# Patient Record
Sex: Female | Born: 1955 | Race: White | Hispanic: No | Marital: Married | Smoking: Former smoker
Health system: Southern US, Community
[De-identification: ages and names within clinical notes are randomized; demographics above are authoritative.]

## PROBLEM LIST (undated history)

## (undated) DIAGNOSIS — K589 Irritable bowel syndrome without diarrhea: Secondary | ICD-10-CM

## (undated) DIAGNOSIS — I10 Essential (primary) hypertension: Secondary | ICD-10-CM

## (undated) DIAGNOSIS — M19019 Primary osteoarthritis, unspecified shoulder: Secondary | ICD-10-CM

## (undated) DIAGNOSIS — M1710 Unilateral primary osteoarthritis, unspecified knee: Secondary | ICD-10-CM

## (undated) DIAGNOSIS — Z8719 Personal history of other diseases of the digestive system: Secondary | ICD-10-CM

## (undated) DIAGNOSIS — G609 Hereditary and idiopathic neuropathy, unspecified: Secondary | ICD-10-CM

## (undated) DIAGNOSIS — R768 Other specified abnormal immunological findings in serum: Secondary | ICD-10-CM

## (undated) DIAGNOSIS — G4734 Idiopathic sleep related nonobstructive alveolar hypoventilation: Secondary | ICD-10-CM

## (undated) DIAGNOSIS — IMO0001 Reserved for inherently not codable concepts without codable children: Secondary | ICD-10-CM

## (undated) DIAGNOSIS — C801 Malignant (primary) neoplasm, unspecified: Secondary | ICD-10-CM

## (undated) DIAGNOSIS — M7542 Impingement syndrome of left shoulder: Secondary | ICD-10-CM

## (undated) DIAGNOSIS — F419 Anxiety disorder, unspecified: Secondary | ICD-10-CM

## (undated) DIAGNOSIS — M179 Osteoarthritis of knee, unspecified: Secondary | ICD-10-CM

## (undated) DIAGNOSIS — G2581 Restless legs syndrome: Secondary | ICD-10-CM

## (undated) DIAGNOSIS — Z9889 Other specified postprocedural states: Secondary | ICD-10-CM

## (undated) DIAGNOSIS — Z85820 Personal history of malignant melanoma of skin: Secondary | ICD-10-CM

## (undated) DIAGNOSIS — K219 Gastro-esophageal reflux disease without esophagitis: Secondary | ICD-10-CM

## (undated) HISTORY — DX: Malignant (primary) neoplasm, unspecified: C80.1

## (undated) HISTORY — PX: ABDOMINAL HYSTERECTOMY: SHX81

## (undated) HISTORY — DX: Hereditary and idiopathic neuropathy, unspecified: G60.9

## (undated) HISTORY — DX: Anxiety disorder, unspecified: F41.9

## (undated) HISTORY — PX: OTHER SURGICAL HISTORY: SHX169

---

## 1993-02-01 HISTORY — PX: CARPAL TUNNEL RELEASE: SHX101

## 1994-02-01 HISTORY — PX: CHOLECYSTECTOMY: SHX55

## 1997-02-01 HISTORY — PX: TOTAL ABDOMINAL HYSTERECTOMY W/ BILATERAL SALPINGOOPHORECTOMY: SHX83

## 1998-02-01 HISTORY — PX: ANTERIOR CERVICAL DECOMP/DISCECTOMY FUSION: SHX1161

## 1999-07-06 ENCOUNTER — Ambulatory Visit (HOSPITAL_COMMUNITY): Admission: RE | Admit: 1999-07-06 | Discharge: 1999-07-06 | Payer: Self-pay | Admitting: Family Medicine

## 1999-07-06 ENCOUNTER — Encounter: Payer: Self-pay | Admitting: Family Medicine

## 1999-07-12 ENCOUNTER — Ambulatory Visit (HOSPITAL_COMMUNITY): Admission: RE | Admit: 1999-07-12 | Discharge: 1999-07-12 | Payer: Self-pay | Admitting: Family Medicine

## 1999-07-12 ENCOUNTER — Encounter: Payer: Self-pay | Admitting: Family Medicine

## 2001-03-10 ENCOUNTER — Encounter: Payer: Self-pay | Admitting: Orthopaedic Surgery

## 2001-03-10 ENCOUNTER — Encounter: Admission: RE | Admit: 2001-03-10 | Discharge: 2001-03-10 | Payer: Self-pay | Admitting: Orthopaedic Surgery

## 2001-03-24 ENCOUNTER — Encounter: Admission: RE | Admit: 2001-03-24 | Discharge: 2001-03-24 | Payer: Self-pay | Admitting: Orthopaedic Surgery

## 2001-03-24 ENCOUNTER — Encounter: Payer: Self-pay | Admitting: Orthopaedic Surgery

## 2001-04-07 ENCOUNTER — Encounter: Payer: Self-pay | Admitting: Orthopaedic Surgery

## 2001-04-07 ENCOUNTER — Encounter: Admission: RE | Admit: 2001-04-07 | Discharge: 2001-04-07 | Payer: Self-pay | Admitting: Orthopaedic Surgery

## 2003-04-22 ENCOUNTER — Ambulatory Visit (HOSPITAL_COMMUNITY): Admission: RE | Admit: 2003-04-22 | Discharge: 2003-04-22 | Payer: Self-pay | Admitting: Family Medicine

## 2003-08-22 ENCOUNTER — Ambulatory Visit (HOSPITAL_COMMUNITY): Admission: RE | Admit: 2003-08-22 | Discharge: 2003-08-23 | Payer: Self-pay | Admitting: Neurosurgery

## 2003-08-22 HISTORY — PX: LUMBAR DISC SURGERY: SHX700

## 2004-03-17 ENCOUNTER — Ambulatory Visit: Payer: Self-pay | Admitting: Family Medicine

## 2004-08-03 ENCOUNTER — Ambulatory Visit: Payer: Self-pay | Admitting: Family Medicine

## 2005-03-02 ENCOUNTER — Ambulatory Visit: Payer: Self-pay | Admitting: Family Medicine

## 2005-06-16 ENCOUNTER — Ambulatory Visit: Payer: Self-pay | Admitting: Family Medicine

## 2005-08-24 ENCOUNTER — Ambulatory Visit: Payer: Self-pay | Admitting: Family Medicine

## 2005-09-23 ENCOUNTER — Ambulatory Visit: Payer: Self-pay | Admitting: Cardiology

## 2005-11-08 ENCOUNTER — Ambulatory Visit: Payer: Self-pay | Admitting: Family Medicine

## 2005-12-27 ENCOUNTER — Ambulatory Visit: Payer: Self-pay | Admitting: Family Medicine

## 2006-03-25 ENCOUNTER — Ambulatory Visit: Payer: Self-pay | Admitting: Family Medicine

## 2007-07-03 HISTORY — PX: COLONOSCOPY: SHX174

## 2007-07-03 HISTORY — PX: ESOPHAGOGASTRODUODENOSCOPY: SHX1529

## 2007-07-04 ENCOUNTER — Ambulatory Visit: Payer: Self-pay | Admitting: Internal Medicine

## 2007-07-11 ENCOUNTER — Ambulatory Visit: Payer: Self-pay | Admitting: Internal Medicine

## 2007-07-11 ENCOUNTER — Encounter: Payer: Self-pay | Admitting: Internal Medicine

## 2007-07-11 ENCOUNTER — Ambulatory Visit (HOSPITAL_COMMUNITY): Admission: RE | Admit: 2007-07-11 | Discharge: 2007-07-11 | Payer: Self-pay | Admitting: Internal Medicine

## 2007-11-10 ENCOUNTER — Ambulatory Visit (HOSPITAL_COMMUNITY): Admission: RE | Admit: 2007-11-10 | Discharge: 2007-11-10 | Payer: Self-pay | Admitting: Family Medicine

## 2010-03-13 ENCOUNTER — Emergency Department (HOSPITAL_COMMUNITY)
Admission: EM | Admit: 2010-03-13 | Discharge: 2010-03-13 | Disposition: A | Discharge status: Home / Self Care | Payer: Self-pay | Attending: Emergency Medicine | Admitting: Emergency Medicine

## 2010-03-13 DIAGNOSIS — R0682 Tachypnea, not elsewhere classified: Secondary | ICD-10-CM | POA: Insufficient documentation

## 2010-03-13 DIAGNOSIS — R42 Dizziness and giddiness: Secondary | ICD-10-CM | POA: Insufficient documentation

## 2010-03-13 DIAGNOSIS — R209 Unspecified disturbances of skin sensation: Secondary | ICD-10-CM | POA: Insufficient documentation

## 2010-03-13 DIAGNOSIS — F411 Generalized anxiety disorder: Secondary | ICD-10-CM | POA: Insufficient documentation

## 2010-03-13 DIAGNOSIS — R61 Generalized hyperhidrosis: Secondary | ICD-10-CM | POA: Insufficient documentation

## 2010-03-13 DIAGNOSIS — I10 Essential (primary) hypertension: Secondary | ICD-10-CM | POA: Insufficient documentation

## 2010-03-13 DIAGNOSIS — F410 Panic disorder [episodic paroxysmal anxiety] without agoraphobia: Secondary | ICD-10-CM | POA: Insufficient documentation

## 2010-03-13 DIAGNOSIS — R000 Tachycardia, unspecified: Secondary | ICD-10-CM | POA: Insufficient documentation

## 2010-03-13 DIAGNOSIS — R064 Hyperventilation: Secondary | ICD-10-CM | POA: Insufficient documentation

## 2010-03-13 DIAGNOSIS — Z79899 Other long term (current) drug therapy: Secondary | ICD-10-CM | POA: Insufficient documentation

## 2010-06-16 NOTE — Op Note (Signed)
Laura [name], Laura [name]                ACCOUNT NO.:  [id number]   MEDICAL RECORD NO.:  [id number]          PATIENT TYPE:  AMB   LOCATION:  DAY                           FACILITY:  [facility]   PHYSICIAN:  R. Roetta Sessions, M.D. DATE OF BIRTH:  1955-12-05   DATE OF PROCEDURE:  07/11/2007  DATE OF DISCHARGE:                               OPERATIVE REPORT   PROCEDURE:  Esophagogastroduodenoscopy with small bowel biopsy followed  by ileocolonoscopy with segmental biopsy, and stool collection.   INDICATIONS FOR PROCEDURE:  A 55 year old lady with a history of  irritable bowel syndrome, diarrhea predominant with no worsening of  diarrhea, reports 10 stools daily along with anorexia, nausea,  refractive-reflux symptoms, and some weight loss.  She has never had her  lower GI tract evaluated.  There is no family history of IBD or  colorectal neoplasia.  EGD and colonoscopy are now being done.  This  approach has been discussed with the patient at length.  Risks,  benefits, and alternatives have been reviewed, questions answered.  Please see documentation in the medical record.   PROCEDURE NOTE:  O2 saturation, blood pressure, pulse, and respirations  were monitored throughout the entirety of both procedures.   CONSCIOUS SEDATION FOR BOTH PROCEDURES:  Versed 5 mg IV and Demerol 125  mg IV, Cetacaine spray for topical pharyngeal anesthesia.   INSTRUMENTATION:  Pentax video chip system.   FINDINGS:  EGD.  Examination of tubular esophagus revealed no mucosal  abnormalities.  EG junction easily traversed.   Stomach:  Gastric cavity was emptied and insufflated well with air.  Thorough examination of the gastric mucosa including retroflex view of  the proximal stomach esophagogastric junction demonstrated a small  hiatal hernia only.  Pylorus is patent, easily traversed.  Examination  of the bulb and second and third portion revealed no mucosal  abnormalities.   THERAPEUTIC/DIAGNOSTIC MANEUVERS  PERFORMED:  Biopsies of D2 and D3 were  taken to rule out villous atrophy.  The patient tolerated the procedure  well.   Colonoscopy:  Digital rectal examination revealed no abnormalities.  Endoscopic findings:  Prep was good.  Colon:  Colonic mucosa was  surveyed from the rectosigmoid junction through the left transverse  right colon to appendiceal orifice, ileocecal valve, and cecum.  These  structures were well seen and photographed for the record.  Terminal  ileum was intubated to 15 cm.  From this level, scope was slowly  withdrawn.  All previously mentioned mucosal surfaces were again seen.  The colonic mucosa as well as the terminal ileum mucosa appeared normal.  Stool residue was suctioned for microbiology studies.  Segmental  biopsies of the transverse and sigmoid segments were taken to rule out  microscopic colitis.  Scope was pulled down the rectum where thorough  examination of the rectal mucosa including retroflexed view of the anal  verge demonstrated no abnormalities.  The patient tolerated both  procedures well and was reacted in endoscopy.   IMPRESSION:  Esophagogastroduodenoscopy, normal esophagus and small  hiatal hernia, otherwise normal stomach D1 through D3, status post  biopsy, duodenum.   The  patient did have scattered tiny fundic gland gastric polyps of no  clinical significance closed (not mentioned above).   COLONOSCOPY FINDINGS:  Normal rectum:  Terminal ileum, status post  segmental colonic mucosal biopsy and stool sampling.   RECOMMENDATIONS:  Continue Nexium, which she was just switched to from  Prilosec (i.e. 40 mg daily).  Followup on pending studies from today.  Further recommendations to follow.      Laura [name], M.D.  Electronically Signed     RMR/MEDQ  D:  07/11/2007  T:  07/12/2007  Job:  [id number]   cc:   Delaney Meigs, M.D.  Fax: [phone]

## 2010-06-16 NOTE — Consult Note (Signed)
NAMEALILA, SOTERO                ACCOUNT NO.:  [id number]   MEDICAL RECORD NO.:  [id number]           PATIENT TYPE:  AMB   LOCATION:  DAY                           FACILITY:  [facility]   PHYSICIAN:  R. Roetta Sessions, M.D. DATE OF BIRTH:  [birth year]   DATE OF CONSULTATION:  DATE OF DISCHARGE:                                 CONSULTATION   REQUESTING PHYSICIAN:  Delaney Meigs, MD   CONSULTING PHYSICIAN:  R. Roetta Sessions, MD   REASON FOR CONSULTATION:  Change in bowel habits and chronic GERD.   HISTORY OF PRESENT ILLNESS:  Ms. Erlich is a 55 year old Caucasian female.  She reports a lifelong history of alternating constipation and diarrhea.  However, more recently, she has had more diarrhea.  She is having  upwards of 10 loose stools per day.  She denies any rectal bleeding or  melena.  She does have nausea, which seems to correlate with her loose  stools.  She has cramp-like abdominal pain, which resolves with  defecation.  She is going at times up to 7 days without a bowel  movement.  Generally, she has alternating days of constipation and  diarrhea previously.  She has also lost 20 pounds in the last 2 months.  She initially began dieting, but more recently she feels as though the  weight is just following off.  She complains of some anorexia as well.  She has had about 3 spells where she gets significant nausea and has not  vomited.  She feels lightheaded.  She was started on Prilosec 20 mg  daily for this.  She does have heartburn and indigestion quite  frequently.  Since she has been taking Prilosec 20 mg daily, she has had  to take b.i.d. dosing at times as well.  She has failed a month of  omeprazole 20 mg b.i.d..  Years ago, she was on Zelnorm, but has not  taken anything recently for her history of IBS.  She tells me within 15  to 20 20 minutes after eating, she has to run to the bathroom.  Ms. Philipp  notes that the stress definitely made her symptoms worse.  She is  currently raising her 39-year-old granddaughter, which is causing  significant amount of stress for her.   PAST MEDICAL AND SURGICAL HISTORY:  IBS, chronic GERD, and carpal tunnel  release.  She had a cholecystectomy in 1996 secondary to cholelithiasis.  She has had a complete hysterectomy.  She had cervical disk surgery on  C4 and C5 and back surgery on L5.   CURRENT MEDICATIONS:  Ativan 0.5 mg b.i.d., Prilosec 20 mg daily, and  Tylenol p.r.n.   ALLERGIES:  CELEBREX.   FAMILY HISTORY:  There is no known family history of colorectal  carcinoma, liver, or chronic GI problems including inflammatory bowel  disease.  Mother deceased at age 14 secondary to lung cancer.  Father  deceased at age 86 due to carcinoma of unknown etiology.  She has 4  healthy siblings.   SOCIAL HISTORY:  Ms. Heimann is married.  She is raising her 47-year-old  granddaughter.  She has two relatively healthy children.  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  She has a 10-pack-a-year history of  tobacco use.  She consumes about 2-3 alcoholic beverages per year.  Denies any drug use.   REVIEW OF SYSTEMS:  See HPI, otherwise negative.   PHYSICAL EXAMINATION:  VITAL SIGNS:  Weight 125 pounds, height 62,  temperature 97.7, blood pressure 128/84, and pulse 92.  GENERAL:  Ms. Neidhardt is a well-developed, well-nourished Caucasian female,  in no acute distress.  HEENT:  Clear sclerae, nonicteric.  Conjunctivae clear.  Oropharynx pink  and moist without lesions.  NECK:  Supple without masses or thyromegaly.  CHEST:  Heart has regular rate and rhythm.  Normal S1 and S2 without  murmurs, clicks, rubs, or gallops.  LUNGS:  Clear to auscultation bilaterally.  ABDOMEN:  Positive bowel sounds x4.  No bruits auscultated.  Soft,  nontender, and nondistended without palpable mass or hepatosplenomegaly.  No rebound, tenderness, or guarding.  EXTREMITIES:  Without clubbing or edema bilaterally.  SKIN:  Pink, warm, and dry without any  rash or jaundice.   IMPRESSION:  Ms. Vesey is a 55 year old Caucasian female with a history  of irritable bowel syndrome, who notes worsening diarrhea upwards of 10  stools per day along with anorexia, nausea, and refractory  gastroesophageal reflux symptoms despite PPI.  I feel she needs further  evaluation to rule out complicated gastroesophageal reflux disease  including erosive esophagitis and peptic ulcer disease.  As far as her  diarrhea is concerned, this could be poorly controlled irritable bowel  syndrome versus microscopic colitis, but we do need to rule out  colorectal carcinoma as well.   PLAN:  1. EGD and colonoscopy with Dr. Jena Gauss in the near future.  I discussed      both procedures including risks and benefits, including but not      limited bleeding, infection, perforation, and drug reaction.  She      agrees to the plan and consent was obtained.  2. Discontinue Prilosec and begin Nexium 40 mg daily.  I have given      her 2 boxes of samples as well as a      prescription for 31 with 2 refills.  3. She may benefit from antispasmodic.  We will await colonoscopy      findings.   Thank you Dr. Lysbeth Galas for allowing [place] participating in the care of Ms.  Padovano.      Lorenza Burton, N.P.      Jonathon Bellows, M.D.  Electronically Signed    KJ/MEDQ  D:  07/04/2007  T:  07/05/2007  Job:  [id number]   cc:   Delaney Meigs, M.D.  Fax: [phone]   R. Roetta Sessions, M.D.  [address]  [place]  [place] [address]

## 2010-06-19 NOTE — Op Note (Signed)
[name], [name]                          ACCOUNT NO.:  [id number]   MEDICAL RECORD NO.:  [id number]                   PATIENT TYPE:  OIB   LOCATION:  3040                                 FACILITY:  [facility]   PHYSICIAN:  Danae Orleans. Venetia Maxon, M.D.               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   POSTOPERATIVE DIAGNOSIS:  Herniated lumbar disk with foraminal stenosis at  L5-S1 in the left with left L5 radiculopathy and spondylosis.   PROCEDURE:  Left L5-S1 foraminotomy with microdiskectomy and  microdissection.   SURGEON:  Danae Orleans. Venetia Maxon, M.D.   ASSISTANT:  Cristi Loron, M.D.   ANESTHESIA:  General endotracheal anesthesia.   ESTIMATED BLOOD LOSS:  Minimal.   COMPLICATIONS:  None.   DISPOSITION:  To recovery.   INDICATIONS:  Laura [name] is a 55 year old woman who, with foraminal  spondylosis and disk herniation at the L5-S1 level on the left with L5  radiculopathy.  This has persisted despite considerable efforts at  conservative management including therapy and epidural steroid injections.  It was elected to take her to surgery for foraminotomy and decompression of  the L5 nerve root.   PROCEDURE:  Ms. Spicer was brought to the operating room.  Following the  satisfactory and uncomplicated induction of general endotracheal anesthesia  and placement of intravenous lines, the patient was placed in the prone  position on the Wilson frame.  Her low back was prepped and draped in the  usual sterile fashion.  The planned incision was infiltrated with 0.25%  Marcaine and 0.5% lidocaine with 1:200,000 epinephrine.  The incision was  made in the midline overlying the L5-S1 interspace and carried through  copious adipose tissue to the lumbodorsal  fascia which was incised in the  left side of midline.  Subperiosteal dissection was performed exposing the  L5-S1 interspace.  A self-retaining retractor was placed and intraoperative  x-ray confirmed the correct level.  A  hemisemilaminectomy of L5 was then  performed with a high speed drill and completed with Kerrison rongeurs, and  medial facetectomy was also performed.  The ligamentum flavum was detached  and removed in a piecemeal fashion.  Microscope was brought into the field.  The L5 nerve root was identified as it coursed out the neuroforamen, and the  lateral aspect of the spinal canal was palpated.  There as a spondylitic  ridge which appeared to be a bony overgrowth of the annular edge just  beneath the takeoff of the L5 nerve root.  This was very carefully mobilized  and then removed with a combination of pituitary rongeurs and Kerrison  rongeurs.  The upper edge of the annulus was opened  and additional calcified  material was then removed which was directly beneath the L5 nerve root as it  extended up the neuroforamen.  The disk space was entered to just a minimal  degree, and this was then cauterized with bipolar electrocautery.  Care was  taken not to perform a more complete diskectomy because the remainder of the  patient's disk appeared to be normal.  A __________ dilator was easily  inserted out the neuroforamen after this decompression, and after additional  spondylitic material was mobilized using Epstein curets, pituitary rongeurs,  and Kerrison rongeurs.  A generous foraminotomy was performed overlying the  L5 nerve root.  Hemostasis was assured with bipolar electrocautery and  Gelfoam soaked in thrombin.  After the nerve root was felt to be well  decompressed, the nerve root was bathed in 80 mg of Depo-Medrol and 2 cubic  centimeters of Fentanyl.  There did not appear to be any residual  compression of the S1 nerve root on the left.  The microscope was then taken   out of the field.  The lumbodorsal fascia was closed with 0 Vicryl sutures.  Subcutaneous tissues were reapproximated with 2-0 Vicryl interrupted,  inverted sutures and the skin edges were reapproximated 3-0 Vicryl  subcuticular stitch.  The wound was dressed with Dermabond.  The patient was  extubated in the operating room and taken to the recovery room in stable  satisfactory condition having tolerated the operation well, with all counts  correct at the end of the case.                                               Danae Orleans. Venetia Maxon, M.D.    JDS/MEDQ  D:  08/22/2003  T:  08/22/2003  Job:  [id number]

## 2010-10-26 ENCOUNTER — Other Ambulatory Visit (HOSPITAL_COMMUNITY): Payer: Self-pay | Admitting: Family Medicine

## 2010-10-26 ENCOUNTER — Ambulatory Visit (HOSPITAL_COMMUNITY)
Admission: RE | Admit: 2010-10-26 | Discharge: 2010-10-26 | Disposition: A | Discharge status: Home / Self Care | Payer: BC Managed Care – PPO | Source: Ambulatory Visit | Attending: Family Medicine | Admitting: Family Medicine

## 2010-10-26 DIAGNOSIS — M25569 Pain in unspecified knee: Secondary | ICD-10-CM

## 2010-10-29 LAB — GIARDIA/CRYPTOSPORIDIUM SCREEN(EIA)
Cryptosporidium Screen (EIA): NEGATIVE
Giardia Screen - EIA: NEGATIVE

## 2010-10-29 LAB — FECAL LACTOFERRIN, QUANT: Fecal Lactoferrin: NEGATIVE

## 2010-10-29 LAB — CLOSTRIDIUM DIFFICILE EIA

## 2012-06-13 ENCOUNTER — Other Ambulatory Visit (HOSPITAL_COMMUNITY): Payer: Self-pay | Admitting: Internal Medicine

## 2012-06-13 DIAGNOSIS — M542 Cervicalgia: Secondary | ICD-10-CM

## 2012-06-13 DIAGNOSIS — Z139 Encounter for screening, unspecified: Secondary | ICD-10-CM

## 2012-06-21 ENCOUNTER — Ambulatory Visit (HOSPITAL_COMMUNITY)
Admission: RE | Admit: 2012-06-21 | Discharge: 2012-06-21 | Disposition: A | Discharge status: Home / Self Care | Payer: BC Managed Care – PPO | Source: Ambulatory Visit | Attending: Internal Medicine | Admitting: Internal Medicine

## 2012-06-21 DIAGNOSIS — M542 Cervicalgia: Secondary | ICD-10-CM

## 2012-06-21 DIAGNOSIS — Z780 Asymptomatic menopausal state: Secondary | ICD-10-CM | POA: Insufficient documentation

## 2012-06-21 DIAGNOSIS — Z139 Encounter for screening, unspecified: Secondary | ICD-10-CM

## 2012-06-21 DIAGNOSIS — M899 Disorder of bone, unspecified: Secondary | ICD-10-CM | POA: Insufficient documentation

## 2012-06-21 DIAGNOSIS — M949 Disorder of cartilage, unspecified: Secondary | ICD-10-CM | POA: Insufficient documentation

## 2012-06-21 DIAGNOSIS — Z13820 Encounter for screening for osteoporosis: Secondary | ICD-10-CM | POA: Insufficient documentation

## 2012-07-27 ENCOUNTER — Ambulatory Visit (INDEPENDENT_AMBULATORY_CARE_PROVIDER_SITE_OTHER): Payer: BC Managed Care – PPO | Admitting: Neurology

## 2012-07-27 ENCOUNTER — Encounter: Payer: Self-pay | Admitting: *Deleted

## 2012-07-27 ENCOUNTER — Encounter: Payer: Self-pay | Admitting: Neurology

## 2012-07-27 VITALS — BP 128/79 | HR 85 | Temp 97.5°F | Ht 61.5 in | Wt 134.0 lb

## 2012-07-27 DIAGNOSIS — G2581 Restless legs syndrome: Secondary | ICD-10-CM

## 2012-07-27 DIAGNOSIS — M79609 Pain in unspecified limb: Secondary | ICD-10-CM

## 2012-07-27 DIAGNOSIS — G609 Hereditary and idiopathic neuropathy, unspecified: Secondary | ICD-10-CM

## 2012-07-27 DIAGNOSIS — M79671 Pain in right foot: Secondary | ICD-10-CM

## 2012-07-27 DIAGNOSIS — G629 Polyneuropathy, unspecified: Secondary | ICD-10-CM

## 2012-07-27 MED ORDER — GABAPENTIN 100 MG PO CAPS: ORAL_CAPSULE | ORAL | Status: DC

## 2012-07-27 NOTE — Progress Notes (Signed)
Subjective:    Patient ID: Laura [name] is a 57 y.o. female.  HPI  Huston Foley, MD, PhD [facility] [address] [address] [place], [place] [address]   Dear Dr. Sherwood Gambler,   I saw your patient, Laura [name], upon your kind request in my neurologic clinic today for initial consultation of her neuropathy and muscle spasms. The patient is unaccompanied today. As you know, Laura [name] is a very pleasant 57 year old right-handed woman with an underlying medical history of hypertension, reflux disease, insomnia, and peripheral neuropathy who has been experiencing worsening neuropathic pain and muscle spasms for the past several months. She is currently on Valium, hydrocodone, gabapentin, Protonix, prednisone 5 mg daily, Ambien as needed, Norvasc, Wellbutrin, Fosamax, Flexeril, magnesium oxide. 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 Her half sister was diagnosed with neuropathy and the patient saw a podiatrist, who did X-rays and told her she had plantar fasciitis. She c/o paresthesias, cramping, burning sensation, which started in her soles and now has advanced to her foot and ankle area bilaterally. She also endorses some RLS symptoms. She has been given a cream by her foot doctor and when she started gabapentin, she had blisters in her mouth, which resolved. She had one syncopal event at work after starting gabapentin for a few days and after a week she fell at home, bumping her head, but no LOC. She had no further issues like this. She has had dizziness and lightheadedness and pre-syncopal spells even before she started the gabapentin. She has been taking the hydrocodone sparingly as she raises her 58 yo GD and knows the narcotic makes her foggy headed. She endorses significant stressors, including losing her daughter to MI some 2 years ago and her GD's father, pt's son is on drugs.  She works full-time at a [employer] and her job involves Manufacturing engineer. She tries not to take hydrocodone or Valium at work. She does complain of considerable pain during the day and the work week has been exceedingly difficult for her. She has 8 or 10 hour days. She is currently on FMLA. She tries to take the hydrocodone only at night and only if needed. She is wondering whether her significant stressors are contributing to her problem. She is depressed at times.  Her Past Medical History Is Significant For: Past Medical History  Diagnosis Date  . HTN (hypertension)   . Anxiety     Her Past Surgical History Is Significant For: Past Surgical History  Procedure Laterality Date  . Cholecystectomy  1997  . Carpal tunnel release Right 1995  . Back surgery  2000  . Neck surgery  2000    Her Family History Is Significant For: Family History  Problem Relation Age of Onset  . Cancer Mother   . HIV/AIDS Father     Her Social History Is Significant For: History   Social History  . Marital Status: Married    Spouse Name: N/A    Number of Children: N/A  . Years of Education: N/A   Social History Main Topics  . Smoking status: Current Every Day Smoker -- 0.50 packs/day for 15 years    Types: Cigarettes  . Smokeless tobacco: None  . Alcohol Use: No  . Drug Use: No  . Sexually Active: None   Other Topics Concern  . None   Social History Narrative  . None    Her Allergies Are:  Allergies  Allergen Reactions  .  Celebrex (Celecoxib) Hives  :   Her Current Medications Are:  Outpatient Encounter Prescriptions as of 07/27/2012  Medication Sig Dispense Refill  . alendronate (FOSAMAX) 70 MG tablet Take 1 tablet by mouth once a week.      Marland Kitchen amLODipine (NORVASC) 5 MG tablet Take 1 tablet by mouth 3 (three) times daily.      Marland Kitchen buPROPion (WELLBUTRIN) 100 MG tablet Take 1 tablet by mouth daily.      . cyclobenzaprine (FLEXERIL) 10 MG tablet Take 1 tablet by mouth daily.      . diazepam (VALIUM) 5 MG tablet Take 1 tablet by mouth 3 (three)  times daily.      Marland Kitchen gabapentin (NEURONTIN) 100 MG capsule Take 1 in AM, 1 midday and 2 at bedtime  120 capsule  5  . hydrochlorothiazide (MICROZIDE) 12.5 MG capsule       . HYDROcodone-acetaminophen (NORCO) 10-325 MG per tablet       . LORazepam (ATIVAN) 0.5 MG tablet       . losartan (COZAAR) 100 MG tablet       . methocarbamol (ROBAXIN) 750 MG tablet       . pantoprazole (PROTONIX) 40 MG tablet       . zolpidem (AMBIEN) 10 MG tablet       . [DISCONTINUED] gabapentin (NEURONTIN) 100 MG capsule Take 100 mg by mouth 3 (three) times daily.      . [DISCONTINUED] predniSONE (DELTASONE) 5 MG tablet       . dicyclomine (BENTYL) 20 MG tablet        No facility-administered encounter medications on file as of 07/27/2012.   Review of Systems  Constitutional: Positive for fatigue.  Gastrointestinal: Positive for diarrhea and constipation.       IBS  Musculoskeletal: Positive for myalgias and arthralgias.       Cramps  Neurological: Positive for dizziness, weakness, numbness and headaches.       Memory loss, restless leg  Psychiatric/Behavioral: Positive for dysphoric mood. The patient is nervous/anxious.        Anxiety    Objective:  Neurologic Exam  Physical Exam Physical Examination:   Filed Vitals:   07/27/12 0827  BP: 128/79  Pulse: 85  Temp: 97.5 F (36.4 C)    General Examination: The patient is a very pleasant 57 y.o. female in no acute distress. She appears well-developed and well-nourished and adequately groomed. She is mildly anxious appearing. When she talks about her stressors she does come close to tears.  HEENT: Normocephalic, atraumatic, pupils are equal, round and reactive to light and accommodation. Funduscopic exam is normal with sharp disc margins noted. Extraocular tracking is good without limitation to gaze excursion or nystagmus noted. Normal smooth pursuit is noted. Hearing is grossly intact. Tympanic membranes are clear bilaterally. Face is symmetric with  normal facial animation and normal facial sensation. Speech is clear with no dysarthria noted. There is no hypophonia. There is no lip, neck/head, jaw or voice tremor. Neck is supple with full range of passive and active motion. There are no carotid bruits on auscultation. Oropharynx exam reveals: mild mouth dryness, adequate dental hygiene and no airway crowding. Mallampati is class II. Tongue protrudes centrally and palate elevates symmetrically.   Chest: Clear to auscultation without wheezing, rhonchi or crackles noted.  Heart: S1+S2+0, regular and normal without murmurs, rubs or gallops noted.   Abdomen: Soft, non-tender and non-distended with normal bowel sounds appreciated on auscultation.  Extremities: There is no pitting edema in  the distal lower extremities bilaterally. Pedal pulses are intact.   Skin: Warm and dry without trophic changes noted. There are no varicose veins, except for a few prominent appearing veins..  Musculoskeletal: exam reveals no obvious joint deformities, tenderness or joint swelling or erythema.   Neurologically:  Mental status: The patient is awake, alert and oriented in all 4 spheres. Her memory, attention, language and knowledge are appropriate. There is no aphasia, agnosia, apraxia or anomia. Speech is clear with normal prosody and enunciation. Thought process is linear. Mood is congruent and affect is normal.  Cranial nerves are as described above under HEENT exam. In addition, shoulder shrug is normal with equal shoulder height noted. Motor exam: Normal bulk, strength and tone is noted. There is no drift, tremor or rebound. Romberg is negative. Reflexes are 2+ throughout. Toes are downgoing bilaterally. Fine motor skills are intact with normal finger taps, normal hand movements, normal rapid alternating patting, normal foot taps and normal foot agility.  Cerebellar testing shows no dysmetria or intention tremor on finger to nose testing. Heel to shin is  unremarkable bilaterally. There is no truncal or gait ataxia.  Sensory exam is intact to light touch, pinprick, vibration, temperature sense and proprioception in the upper and lower extremities, with the exception of mild decrease in pinprick sensation and vibration sense in her feet bilaterally. Gait, station and balance are unremarkable. No veering to one side is noted. No leaning to one side is noted. Posture is age-appropriate and stance is narrow based. No problems turning are noted. She turns en bloc. Tandem walk is unremarkable. Intact toe and heel stance is noted.               Assessment and Plan:   Assessment and Plan:  In summary, Laura [name] is a very pleasant 57 y.o.-year old female with a history of burning foot pain and tingling as well as some restless leg symptoms. Her history and physical exam are consistent with some degree of peripheral neuropathy and there may be a hereditary component to it. She has been on gabapentin for symptomatic relief as well as hydrocodone. 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 She has been mindful of this and it looks like she does use her Valium and hydrocodone only sparingly during the day. She is advised to undergo more testing including electrophysiological testing to her feet in the form of EMG and nerve conduction studies. I would like to add some blood work including SPEP, and inflammatory markers. She doesn't have a component of RLS and I did endorse jerking in her sleep. Rather than adding yet another medication such as a dopamine agonist at this time I would suggest increasing her gabapentin to 2 pills at night and keeping the daytime doses the same.   I answered all her questions today and the patient was in agreement with the above outlined plan. I would like to see the patient back in 3 months, sooner if the need arises and encouraged her to call with any interim questions,  concerns, problems or updates and test results.   Thank you very much for allowing me to participate in the care of this nice patient. If I can be of any further assistance to you please do not hesitate to call me at [phone].  Sincerely,   Huston Foley, MD, PhD

## 2012-07-27 NOTE — Patient Instructions (Addendum)
I do want to suggest a few things today:   Remember to drink plenty of fluid, eat healthy meals and do not skip any meals. Try to eat protein with a every meal and eat a healthy snack such as fruit or nuts in between meals. Try to keep a regular sleep-wake schedule and try to exercise daily, particularly in the form of walking, 20-30 minutes a day, if you can.   As far as your medications are concerned, I would like to suggest increasing your evening dose of gabapentin to 2 pills, rest is the same.  As far as diagnostic testing: EMG/NCV testing will be scheduled.  I would like to see you back in 3 months, sooner if we need to. Please call us with any interim questions, concerns, problems, updates or refill requests.  Please also call us for any test results so we can go over those with you on the phone. Brett Canales is my clinical assistant and will answer any of your questions and relay your messages to me and also relay most of my messages to you.  Our phone number is [phone]. We also have an after hours call service for urgent matters and there is a physician on-call for urgent questions. For any emergencies you know to call 911 or go to the nearest emergency room.

## 2012-07-28 LAB — PROTEIN ELECTROPHORESIS, SERUM
A/G Ratio: 1.4 (ref 0.7–2.0)
Alpha 2: 0.8 g/dL (ref 0.4–1.2)
Beta: 1 g/dL (ref 0.6–1.3)
Gamma Globulin: 1 g/dL (ref 0.5–1.6)
Globulin, Total: 3.1 g/dL (ref 2.0–4.5)

## 2012-08-02 LAB — COMPREHENSIVE METABOLIC PANEL
AST: 19 IU/L (ref 0–40)
Albumin/Globulin Ratio: 2.2 (ref 1.1–2.5)
Albumin: 4.8 g/dL (ref 3.5–5.5)
CO2: 26 mmol/L (ref 18–29)
Chloride: 99 mmol/L (ref 97–108)
Glucose: 88 mg/dL (ref 65–99)
Potassium: 4.5 mmol/L (ref 3.5–5.2)
Total Bilirubin: 0.5 mg/dL (ref 0.0–1.2)

## 2012-08-02 LAB — FERRITIN: Ferritin: 138 ng/mL (ref 15–150)

## 2012-08-02 LAB — CBC WITH DIFFERENTIAL
Basos: 1 % (ref 0–3)
Eosinophils Absolute: 0.2 10*3/uL (ref 0.0–0.4)
HCT: 43.2 % (ref 34.0–46.6)
Hemoglobin: 14.9 g/dL (ref 11.1–15.9)
Immature Granulocytes: 0 % (ref 0–2)
Lymphocytes Absolute: 2.1 10*3/uL (ref 0.7–3.1)
MCH: 32.8 pg (ref 26.6–33.0)
MCHC: 34.5 g/dL (ref 31.5–35.7)
RDW: 12.9 % (ref 12.3–15.4)

## 2012-08-02 LAB — C-REACTIVE PROTEIN: CRP: 1.4 mg/L (ref 0.0–4.9)

## 2012-08-02 LAB — IRON AND TIBC
Iron Saturation: 40 % (ref 15–55)
TIBC: 292 ug/dL (ref 250–450)
UIBC: 175 ug/dL (ref 150–375)

## 2012-08-02 LAB — B12 AND FOLATE PANEL: Folate: >19.9 ng/mL (ref >3.0)

## 2012-08-02 LAB — RHEUMATOID FACTOR: Rhuematoid fact SerPl-aCnc: <7 IU/mL (ref 0.0–13.9)

## 2012-08-02 LAB — VITAMIN D 25 HYDROXY (VIT D DEFICIENCY, FRACTURES): Vit D, 25-Hydroxy: 29.4 ng/mL — ABNORMAL LOW (ref 30.0–100.0)

## 2012-08-02 NOTE — Progress Notes (Signed)
Quick Note:  Please call and advise the patient that the recent labs we checked were within normal limits. We checked vitamin D level, vitamin B12 level, cell count, blood sugar, diabetes marker, electrolytes, kidney function, liver function, thyroid function, inflammatory markers,and vitamin E. The only thing that was borderline was a borderline low vitamin D level for which she can take an over the counter vitamin D supplement. Also her platelets are mildly elevated which is a nonspecific finding and can be followed over time. No further action is required on these tests at this time. Please remind patient to keep any upcoming appointments or tests and to call us with any interim questions, concerns, problems or updates. Thanks,  Huston Foley, MD, PhD    ______

## 2012-08-03 NOTE — Progress Notes (Signed)
Quick Note:  I spoke to patient and relayed lab results, per Dr. Frances Furbish. The patient is already taking Vitamin D supplement because of bone density test results. ______

## 2012-08-07 ENCOUNTER — Encounter: Payer: BC Managed Care – PPO | Admitting: Neurology

## 2012-08-11 ENCOUNTER — Telehealth: Payer: Self-pay | Admitting: Neurology

## 2012-08-11 ENCOUNTER — Encounter: Payer: BC Managed Care – PPO | Admitting: Neurology

## 2012-08-11 NOTE — Telephone Encounter (Signed)
Had to r/s due to provider is out of office. Gave an apt for her but she states she needs to get in next week sooner. Pt would like for someone to call her to get her in sooner and she has a couple of other issues she would like to check on. Thanks

## 2012-08-16 ENCOUNTER — Telehealth: Payer: Self-pay

## 2012-08-16 NOTE — Telephone Encounter (Signed)
I called patient to make sure she was okay with July 24 appointment, as she had been concerned about this due to her pain. I also asked if she had other questions. She said she does have an appointment and that will be okay. She had her other questions answered when the appointment was scheduled. She is okay now and has no further questions.

## 2012-08-24 ENCOUNTER — Encounter: Payer: Self-pay | Admitting: Neurology

## 2012-08-24 ENCOUNTER — Encounter (INDEPENDENT_AMBULATORY_CARE_PROVIDER_SITE_OTHER): Payer: BC Managed Care – PPO

## 2012-08-24 ENCOUNTER — Ambulatory Visit (INDEPENDENT_AMBULATORY_CARE_PROVIDER_SITE_OTHER): Payer: BC Managed Care – PPO | Admitting: Neurology

## 2012-08-24 DIAGNOSIS — G2581 Restless legs syndrome: Secondary | ICD-10-CM

## 2012-08-24 DIAGNOSIS — M79609 Pain in unspecified limb: Secondary | ICD-10-CM

## 2012-08-24 DIAGNOSIS — Z0289 Encounter for other administrative examinations: Secondary | ICD-10-CM

## 2012-08-24 DIAGNOSIS — G629 Polyneuropathy, unspecified: Secondary | ICD-10-CM

## 2012-08-24 DIAGNOSIS — M79671 Pain in right foot: Secondary | ICD-10-CM

## 2012-08-24 NOTE — Procedures (Signed)
  HISTORY:  Laura [name] is a 57 year old patient with a history of pain in both lower extremities over the last several months. The patient reports burning sensations that are traveling up to the mid thigh level, and pain with walking. The patient also reports imbalance with walking. She has a history of prior lumbosacral spine surgery. The patient is being evaluated for a neuropathy or a possible lumbosacral radiculopathy.  NERVE CONDUCTION STUDIES:  Nerve conduction studies were performed on the right upper extremity. The distal motor latencies and motor amplitudes for the median and ulnar nerves were within normal limits. The F wave latencies and nerve conduction velocities for these nerves were also normal. The sensory latencies for the median and ulnar nerves were normal.  Nerve conduction studies were performed on both lower extremities. The distal motor latencies and motor amplitudes for the peroneal and posterior tibial nerves were within normal limits. The nerve conduction velocities for these nerves were also normal. The H reflex latencies were normal. The sensory latencies for the peroneal nerves were within normal limits.   EMG STUDIES:  EMG study was performed on the left lower extremity:  The tibialis anterior muscle reveals 2 to 3K motor units with full recruitment. No fibrillations or positive waves were seen. The peroneus tertius muscle reveals 2 to 3K motor units with full recruitment. No fibrillations or positive waves were seen. The medial gastrocnemius muscle reveals 1 to 3K motor units with full recruitment. No fibrillations or positive waves were seen. The vastus lateralis muscle reveals 2 to 3K motor units with full recruitment. No fibrillations or positive waves were seen. The iliopsoas muscle reveals 2 to 3K motor units with full recruitment. No fibrillations or positive waves were seen. The biceps femoris muscle (long head) reveals 2 to 4K motor units with full  recruitment. No fibrillations or positive waves were seen. The lumbosacral paraspinal muscles were tested at 3 levels, and revealed no abnormalities of insertional activity at all 3 levels tested. There was good relaxation.  EMG study was performed on the right lower extremity:  The tibialis anterior muscle reveals 2 to 4K motor units with full recruitment. No fibrillations or positive waves were seen. The peroneus tertius muscle reveals 2 to 4K motor units with full recruitment. No fibrillations or positive waves were seen. The medial gastrocnemius muscle reveals 1 to 3K motor units with full recruitment. No fibrillations or positive waves were seen. The vastus lateralis muscle reveals 2 to 4K motor units with full recruitment. No fibrillations or positive waves were seen. The iliopsoas muscle reveals 2 to 4K motor units with full recruitment. No fibrillations or positive waves were seen. The biceps femoris muscle (long head) reveals 2 to 4K motor units with full recruitment. No fibrillations or positive waves were seen. The lumbosacral paraspinal muscles were tested at 3 levels, and revealed no abnormalities of insertional activity at all 3 levels tested. There was good relaxation.   IMPRESSION:  Nerve conduction studies done on both lower extremities and on the right upper extremity were within normal limits. There is no evidence of a peripheral neuropathy. A small fiber neuropathy however, may be missed by a standard nerve conduction studies. EMG evaluations of both lower extremities were normal. There is no evidence of an overlying lumbosacral radiculopathy on either side.  Marlan Palau MD 08/24/2012 1:39 PM  [facility] [address] [place], [place] [address]  Phone [phone] Fax [phone]

## 2012-08-24 NOTE — Progress Notes (Signed)
Quick Note:  Please call and advise the patient that the recent EMG and nerve conduction velocity test, which is the electrical nerve and muscle test we we performed, was reported as within normal limits. We checked for evidence of nerve or muscle damage. No further action is required on this test at this time. Please remind patient to keep any upcoming appointments or tests and to call us with any interim questions, concerns, problems or updates. Thanks,  Huston Foley, MD, PhD   ______

## 2012-08-28 NOTE — Progress Notes (Signed)
Quick Note:  Left a message on the pt's voice mail (vm was in the patient's voice and she stated her name) regarding her recent EMG being within normal limits. Contact information was given so that she may call with any questions or concerns.   ______

## 2012-10-24 ENCOUNTER — Encounter: Payer: Self-pay | Admitting: Neurology

## 2012-10-24 ENCOUNTER — Ambulatory Visit (INDEPENDENT_AMBULATORY_CARE_PROVIDER_SITE_OTHER): Payer: BC Managed Care – PPO | Admitting: Neurology

## 2012-10-24 VITALS — BP 149/90 | HR 83 | Temp 97.7°F | Ht 61.5 in | Wt 133.0 lb

## 2012-10-24 DIAGNOSIS — R269 Unspecified abnormalities of gait and mobility: Secondary | ICD-10-CM

## 2012-10-24 DIAGNOSIS — G629 Polyneuropathy, unspecified: Secondary | ICD-10-CM

## 2012-10-24 DIAGNOSIS — M79671 Pain in right foot: Secondary | ICD-10-CM

## 2012-10-24 DIAGNOSIS — G2581 Restless legs syndrome: Secondary | ICD-10-CM

## 2012-10-24 DIAGNOSIS — G609 Hereditary and idiopathic neuropathy, unspecified: Secondary | ICD-10-CM

## 2012-10-24 DIAGNOSIS — M79609 Pain in unspecified limb: Secondary | ICD-10-CM

## 2012-10-24 NOTE — Progress Notes (Addendum)
Subjective:    Patient ID: Laura [name] is a 57 y.o. female.  HPI Interim history:   Laura [name] is a very pleasant 57 year old right-handed woman with an underlying medical history of hypertension, reflux disease, insomnia, and peripheral neuropathy who presents for followup consultation of her worsening neuropathic pain and muscle spasms for the past several months. I first met her on 07/27/2012, at which time she was on Valium, hydrocodone, gabapentin, Protonix, prednisone 5 mg daily, Ambien as needed, Norvasc, Wellbutrin, Fosamax, Flexeril, magnesium oxide. She was diagnosed with PN about 3 months prior. Her half sister was diagnosed with neuropathy and the patient saw a podiatrist, who did X-rays and told her she had plantar fasciitis. She c/o paresthesias, cramping, burning sensation, which started in her soles and now has advanced to her foot and ankle area bilaterally. She also endorsed some RLS symptoms. She has been given a cream by her foot doctor and when she started gabapentin, she had blisters in her mouth, which resolved. She had one syncopal event at work after starting gabapentin for a few days and after a week she fell at home, bumping her head, but no LOC. She had no further issues like this. She had had dizziness and lightheadedness and pre-syncopal spells even before she started the gabapentin. She has been taking the hydrocodone sparingly as she raises her 58 yo GD and knows the narcotic makes her foggy headed. She endorsed significant stressors, including losing her daughter to MI some 2 years ago and her GD's father, pt's son, is on drugs.  She works full-time at a [employer] and her job involves Water quality scientist. She tries not to take hydrocodone or Valium at work. She does complain of considerable pain during the day and the work week has been exceedingly difficult for her. She has 8 or 10 hour days. She is currently on FMLA. She tries to take the hydrocodone only at night  and only if needed. She is wondering whether her significant stressors are contributing to her problem.  At the time of her visit in 6/14 I suggested further blood work as well as EMG and nerve conduction studies. She had the EMG and nerve conduction test on 08/24/2012, which showed normal nerve conduction values and normal EMG. There was no evidence of peripheral neuropathy, however, small fiber neuropathy may be missed by a standard nerve conduction test. Her blood work was unremarkable and I we called her on 08/02/2012 with the results. She had normal vitamin D, normal B12, normal CBC, normal CMP and the only thing that came back mildly abnormal was a borderline low vitamin D and she was advised to start over-the-counter vitamin D supplements. She had mildly elevated platelets which was nonspecific. She was still sore on the R side after the EMG and still has soreness in both calves.   She has been taking gabapentin with the increase dose at night, which did not help. She c/o bilateral knee pain and she has been walking on the sides of her feet, d/t the pain. She had shingles some 3 years ago affecting her L groin.   Her Past Medical History Is Significant For: Past Medical History  Diagnosis Date  . HTN (hypertension)   . Anxiety   . Unspecified hereditary and idiopathic peripheral neuropathy 07/27/2012    Her Past Surgical History Is Significant For: Past Surgical History  Procedure Laterality Date  . Cholecystectomy  1997  . Carpal tunnel release Right 1995  . Back surgery  2000  . Neck surgery  2000    Her Family History Is Significant For: Family History  Problem Relation Age of Onset  . Cancer Mother   . HIV/AIDS Father     Her Social History Is Significant For: History   Social History  . Marital Status: Married    Spouse Name: N/A    Number of Children: N/A  . Years of Education: N/A   Social History Main Topics  . Smoking status: Current Every Day Smoker -- 0.50  packs/day for 15 years    Types: Cigarettes  . Smokeless tobacco: None  . Alcohol Use: No  . Drug Use: No  . Sexual Activity: None   Other Topics Concern  . None   Social History Narrative  . None    Her Allergies Are:  Allergies  Allergen Reactions  . Celebrex [Celecoxib] Hives  :   Her Current Medications Are:  Outpatient Encounter Prescriptions as of 10/24/2012  Medication Sig Dispense Refill  . alendronate (FOSAMAX) 70 MG tablet Take 1 tablet by mouth once a week.      Marland Kitchen buPROPion (WELLBUTRIN) 100 MG tablet Take 1 tablet by mouth daily.      . diazepam (VALIUM) 5 MG tablet Take 1 tablet by mouth 3 (three) times daily.      Marland Kitchen dicyclomine (BENTYL) 20 MG tablet       . gabapentin (NEURONTIN) 100 MG capsule Take 1 in AM, 1 midday and 2 at bedtime  120 capsule  5  . hydrochlorothiazide (MICROZIDE) 12.5 MG capsule       . HYDROcodone-acetaminophen (NORCO) 10-325 MG per tablet       . LORazepam (ATIVAN) 0.5 MG tablet       . losartan (COZAAR) 100 MG tablet       . pantoprazole (PROTONIX) 40 MG tablet       . zolpidem (AMBIEN) 10 MG tablet       . cyclobenzaprine (FLEXERIL) 10 MG tablet Take 1 tablet by mouth daily.      . methocarbamol (ROBAXIN) 750 MG tablet       . [DISCONTINUED] amLODipine (NORVASC) 5 MG tablet Take 1 tablet by mouth 3 (three) times daily.       No facility-administered encounter medications on file as of 10/24/2012.  : Review of Systems  Endocrine: Positive for polydipsia.  Musculoskeletal: Positive for myalgias and arthralgias (Bilateral knee pain). Joint swelling: Bilateral knee pain.       Cramps  Allergic/Immunologic: Positive for environmental allergies.  Neurological: Positive for numbness and headaches.       Restless leg  Psychiatric/Behavioral: The patient is nervous/anxious.     Objective:  Neurologic Exam  Physical Exam Physical Examination:   Filed Vitals:   10/24/12 1552  BP: 149/90  Pulse: 83  Temp: 97.7 F (36.5 C)      General Examination: The patient is a very pleasant 57 y.o. female in no acute distress. She appears well-developed and well-nourished and adequately groomed. She is mildly anxious appearing. When she talks about her stressors she does come close to tears.  HEENT: Normocephalic, atraumatic, pupils are equal, round and reactive to light and accommodation. Extraocular tracking is good without limitation to gaze excursion or nystagmus noted. Normal smooth pursuit is noted. Hearing is grossly intact. Face is symmetric with normal facial animation and normal facial sensation. Speech is clear with no dysarthria noted. There is no hypophonia. There is no lip, neck/head, jaw or voice tremor. Neck is supple  with full range of passive and active motion. There are no carotid bruits on auscultation. Oropharynx exam reveals: mild mouth dryness, adequate dental hygiene and no airway crowding. Mallampati is class II. Tongue protrudes centrally and palate elevates symmetrically.   Chest: Clear to auscultation without wheezing, rhonchi or crackles noted.  Heart: S1+S2+0, regular and normal without murmurs, rubs or gallops noted.   Abdomen: Soft, non-tender and non-distended with normal bowel sounds appreciated on auscultation.  Extremities: There is no pitting edema in the distal lower extremities bilaterally. Pedal pulses are intact.   Skin: Warm and dry without trophic changes noted. There are no varicose veins, except for a few prominent appearing veins..  Musculoskeletal: exam reveals no obvious joint deformities, tenderness or joint swelling or erythema.   Neurologically:  Mental status: The patient is awake, alert and oriented in all 4 spheres. Her memory, attention, language and knowledge are appropriate. There is no aphasia, agnosia, apraxia or anomia. Speech is clear with normal prosody and enunciation. Thought process is linear. Mood is congruent and affect is normal.  Cranial nerves are as  described above under HEENT exam. In addition, shoulder shrug is normal with equal shoulder height noted. Motor exam: Normal bulk, strength and tone is noted. There is no drift, tremor or rebound. Romberg is negative. Reflexes are 2+ throughout. Toes are downgoing bilaterally. Fine motor skills are intact with normal finger taps, normal hand movements, normal rapid alternating patting, normal foot taps and normal foot agility.  Cerebellar testing shows no dysmetria or intention tremor on finger to nose testing. Heel to shin is unremarkable bilaterally. There is no truncal or gait ataxia.  Sensory exam is intact to light touch, pinprick, vibration, temperature sense and proprioception in the upper and lower extremities, with the exception of mild decrease in pinprick sensation and vibration sense in her feet bilaterally. Gait, station and balance: she walks with mild difficulty and uses a can. No veering to one side is noted. No leaning to one side is noted. Posture is age-appropriate and stance is narrow based. No problems turning are noted. She turns en bloc. Tandem walk is unremarkable. Intact toe and heel stance is noted.               Assessment and Plan:   In summary, Laura [name] is a very pleasant 57 y.o.-year old female with a history of burning foot pain and tingling as well as some restless leg symptoms. Her history and physical exam are in keeping with mild peripheral neuropathy and there may be a hereditary component to it. She has been on gabapentin for symptomatic relief as well as hydrocodone. 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 She has been mindful of this and it looks like she does use her Valium and hydrocodone only sparingly during the day. Thus far, her blood work was unremarkable and her EMG and NCV testing was normal. Nevertheless, she may still have small fiber neuropathy. She has been sore in her muscles since the  EMG. I will add CK, HTLV antibodies, vitamin B6, heavy metal screen in blood/urine. I will keep her medication the same.  I answered all her questions today and the patient was in agreement with the above outlined plan. I would like to see the patient back in 3 months, sooner if the need arises and encouraged her to call with any interim questions, concerns, problems or updates and test results.

## 2012-10-24 NOTE — Patient Instructions (Signed)
We are doing more blood work and urine and blood test for heavy metals.

## 2012-11-27 ENCOUNTER — Telehealth: Payer: Self-pay | Admitting: Neurology

## 2012-11-27 NOTE — Telephone Encounter (Signed)
Informed patient that per Arlene if she is having it done here, no fasting needed, patient understood and said that she will come Monday-11/03 to have blood drawn

## 2012-11-27 NOTE — Telephone Encounter (Signed)
Patient is wanting to know if she needs to fast before labs are drawn,for the orders from 09/23

## 2012-12-04 ENCOUNTER — Other Ambulatory Visit (INDEPENDENT_AMBULATORY_CARE_PROVIDER_SITE_OTHER): Payer: Self-pay

## 2012-12-04 DIAGNOSIS — Z0289 Encounter for other administrative examinations: Secondary | ICD-10-CM

## 2012-12-05 ENCOUNTER — Other Ambulatory Visit (HOSPITAL_COMMUNITY): Payer: Self-pay | Admitting: Sports Medicine

## 2012-12-05 DIAGNOSIS — M25561 Pain in right knee: Secondary | ICD-10-CM

## 2012-12-07 ENCOUNTER — Other Ambulatory Visit: Payer: Self-pay

## 2012-12-08 ENCOUNTER — Ambulatory Visit (HOSPITAL_COMMUNITY)
Admission: RE | Admit: 2012-12-08 | Discharge: 2012-12-08 | Disposition: A | Discharge status: Home / Self Care | Payer: BC Managed Care – PPO | Source: Ambulatory Visit | Attending: Sports Medicine | Admitting: Sports Medicine

## 2012-12-08 DIAGNOSIS — IMO0002 Reserved for concepts with insufficient information to code with codable children: Secondary | ICD-10-CM | POA: Insufficient documentation

## 2012-12-08 DIAGNOSIS — M25561 Pain in right knee: Secondary | ICD-10-CM

## 2012-12-08 DIAGNOSIS — M899 Disorder of bone, unspecified: Secondary | ICD-10-CM | POA: Insufficient documentation

## 2012-12-08 DIAGNOSIS — S83289A Other tear of lateral meniscus, current injury, unspecified knee, initial encounter: Secondary | ICD-10-CM | POA: Insufficient documentation

## 2012-12-08 DIAGNOSIS — M1710 Unilateral primary osteoarthritis, unspecified knee: Secondary | ICD-10-CM | POA: Insufficient documentation

## 2012-12-08 DIAGNOSIS — X58XXXA Exposure to other specified factors, initial encounter: Secondary | ICD-10-CM | POA: Insufficient documentation

## 2012-12-08 DIAGNOSIS — M25569 Pain in unspecified knee: Secondary | ICD-10-CM | POA: Insufficient documentation

## 2012-12-09 LAB — HEAVY METALS PROFILE II, BLOOD
Arsenic: 5 ug/L (ref 2–23)
Cadmium: NOT DETECTED ug/L (ref 0.0–1.2)
Lead, Blood: NOT DETECTED ug/dL (ref 0–19)
Mercury: NOT DETECTED ug/L (ref 0.0–14.9)

## 2012-12-09 LAB — VITAMIN B6: Vitamin B6: 5.4 ug/L (ref 2.0–32.8)

## 2012-12-11 NOTE — Progress Notes (Signed)
Quick Note:  Please advise patient that recent labs were fine which includes heavy metals, muscle enzyme, vitamin B6 level. Huston Foley, MD, PhD [facility] ([facility]  ______

## 2012-12-12 ENCOUNTER — Other Ambulatory Visit: Payer: Self-pay | Admitting: Neurology

## 2012-12-12 ENCOUNTER — Telehealth: Payer: Self-pay | Admitting: Neurology

## 2012-12-12 NOTE — Progress Notes (Signed)
Quick Note:  Shared labs with patient ______

## 2012-12-12 NOTE — Telephone Encounter (Signed)
I spoke to [facility] labs and the patient dropped off a 24hr urine sample with a [facility] requisition.  This was for a heavy metal analysis, which tests for one less metal (cadmium) than [facility].  I spoke to doctor and it is ok for [facility] to do testing.  I called and confirmed with lab to run test.

## 2012-12-12 NOTE — Telephone Encounter (Signed)
Called and shared lab results per Dr Teofilo Pod findings, patient understood

## 2012-12-17 LAB — HEAVY METALS SCREEN, URINE: Mercury 24 Hr Urine: <2 mcg/L (ref <21)

## 2013-01-19 ENCOUNTER — Ambulatory Visit: Payer: BC Managed Care – PPO | Admitting: Neurology

## 2013-02-04 ENCOUNTER — Other Ambulatory Visit: Payer: Self-pay

## 2013-02-04 DIAGNOSIS — M79671 Pain in right foot: Secondary | ICD-10-CM

## 2013-02-04 DIAGNOSIS — G2581 Restless legs syndrome: Secondary | ICD-10-CM

## 2013-02-04 DIAGNOSIS — M79672 Pain in left foot: Secondary | ICD-10-CM

## 2013-02-04 DIAGNOSIS — G629 Polyneuropathy, unspecified: Secondary | ICD-10-CM

## 2013-02-04 MED ORDER — GABAPENTIN 100 MG PO CAPS: ORAL_CAPSULE | ORAL | Status: DC

## 2013-02-20 ENCOUNTER — Encounter (HOSPITAL_COMMUNITY): Payer: BC Managed Care – PPO | Admitting: Physical Therapy

## 2013-04-12 NOTE — Telephone Encounter (Signed)
Closing encounter

## 2013-05-21 ENCOUNTER — Other Ambulatory Visit: Payer: Self-pay | Admitting: Orthopedic Surgery

## 2013-05-24 ENCOUNTER — Encounter (HOSPITAL_BASED_OUTPATIENT_CLINIC_OR_DEPARTMENT_OTHER): Payer: Self-pay | Admitting: *Deleted

## 2013-05-24 NOTE — Progress Notes (Signed)
NPO AFTER MN WITH EXCEPTION CLEAR LIQUIDS UNTIL 0800 (NO CREAM/ MILK PRODUCTS).  ARRIVE AT 1215. NEEDS ISTAT AND EKG. WILL TAKE GABAPENTIN, PROTONIX, LOSARTAN, ATIVAN AND IF NEEDED MAY TAKE HYDROCODONE. 

## 2013-05-25 ENCOUNTER — Encounter (HOSPITAL_BASED_OUTPATIENT_CLINIC_OR_DEPARTMENT_OTHER): Payer: Self-pay

## 2013-05-25 ENCOUNTER — Encounter (HOSPITAL_BASED_OUTPATIENT_CLINIC_OR_DEPARTMENT_OTHER): Payer: BC Managed Care – PPO | Admitting: Anesthesiology

## 2013-05-25 ENCOUNTER — Encounter (HOSPITAL_BASED_OUTPATIENT_CLINIC_OR_DEPARTMENT_OTHER)
Admission: RE | Disposition: A | Discharge status: Home / Self Care | Payer: Self-pay | Source: Ambulatory Visit | Attending: Specialist

## 2013-05-25 ENCOUNTER — Other Ambulatory Visit: Payer: Self-pay

## 2013-05-25 ENCOUNTER — Ambulatory Visit (HOSPITAL_BASED_OUTPATIENT_CLINIC_OR_DEPARTMENT_OTHER)
Admission: RE | Admit: 2013-05-25 | Discharge: 2013-05-25 | Disposition: A | Discharge status: Home / Self Care | Payer: BC Managed Care – PPO | Source: Ambulatory Visit | Attending: Specialist | Admitting: Specialist

## 2013-05-25 ENCOUNTER — Ambulatory Visit (HOSPITAL_BASED_OUTPATIENT_CLINIC_OR_DEPARTMENT_OTHER): Payer: BC Managed Care – PPO | Admitting: Anesthesiology

## 2013-05-25 DIAGNOSIS — G609 Hereditary and idiopathic neuropathy, unspecified: Secondary | ICD-10-CM | POA: Insufficient documentation

## 2013-05-25 DIAGNOSIS — B1910 Unspecified viral hepatitis B without hepatic coma: Secondary | ICD-10-CM | POA: Insufficient documentation

## 2013-05-25 DIAGNOSIS — K449 Diaphragmatic hernia without obstruction or gangrene: Secondary | ICD-10-CM | POA: Insufficient documentation

## 2013-05-25 DIAGNOSIS — K589 Irritable bowel syndrome without diarrhea: Secondary | ICD-10-CM | POA: Insufficient documentation

## 2013-05-25 DIAGNOSIS — Z886 Allergy status to analgesic agent status: Secondary | ICD-10-CM | POA: Insufficient documentation

## 2013-05-25 DIAGNOSIS — K219 Gastro-esophageal reflux disease without esophagitis: Secondary | ICD-10-CM | POA: Insufficient documentation

## 2013-05-25 DIAGNOSIS — M1710 Unilateral primary osteoarthritis, unspecified knee: Secondary | ICD-10-CM | POA: Insufficient documentation

## 2013-05-25 DIAGNOSIS — F411 Generalized anxiety disorder: Secondary | ICD-10-CM | POA: Insufficient documentation

## 2013-05-25 DIAGNOSIS — F17200 Nicotine dependence, unspecified, uncomplicated: Secondary | ICD-10-CM | POA: Insufficient documentation

## 2013-05-25 DIAGNOSIS — M23329 Other meniscus derangements, posterior horn of medial meniscus, unspecified knee: Secondary | ICD-10-CM | POA: Insufficient documentation

## 2013-05-25 DIAGNOSIS — M2240 Chondromalacia patellae, unspecified knee: Secondary | ICD-10-CM | POA: Insufficient documentation

## 2013-05-25 DIAGNOSIS — Z79899 Other long term (current) drug therapy: Secondary | ICD-10-CM | POA: Insufficient documentation

## 2013-05-25 DIAGNOSIS — X58XXXA Exposure to other specified factors, initial encounter: Secondary | ICD-10-CM | POA: Insufficient documentation

## 2013-05-25 DIAGNOSIS — Z9889 Other specified postprocedural states: Secondary | ICD-10-CM

## 2013-05-25 DIAGNOSIS — S83289A Other tear of lateral meniscus, current injury, unspecified knee, initial encounter: Secondary | ICD-10-CM | POA: Insufficient documentation

## 2013-05-25 DIAGNOSIS — I10 Essential (primary) hypertension: Secondary | ICD-10-CM | POA: Insufficient documentation

## 2013-05-25 HISTORY — DX: Irritable bowel syndrome, unspecified: K58.9

## 2013-05-25 HISTORY — DX: Essential (primary) hypertension: I10

## 2013-05-25 HISTORY — PX: KNEE ARTHROSCOPY WITH MEDIAL MENISECTOMY: SHX5651

## 2013-05-25 HISTORY — DX: Restless legs syndrome: G25.81

## 2013-05-25 HISTORY — DX: Personal history of other diseases of the digestive system: Z87.19

## 2013-05-25 HISTORY — DX: Gastro-esophageal reflux disease without esophagitis: K21.9

## 2013-05-25 HISTORY — DX: Unilateral primary osteoarthritis, unspecified knee: M17.10

## 2013-05-25 HISTORY — DX: Osteoarthritis of knee, unspecified: M17.9

## 2013-05-25 HISTORY — DX: Other specified abnormal immunological findings in serum: R76.8

## 2013-05-25 LAB — POCT I-STAT 4, (NA,K, GLUC, HGB,HCT)
Glucose, Bld: 89 mg/dL (ref 70–99)
HCT: 40 % (ref 36.0–46.0)
HEMOGLOBIN: 13.6 g/dL (ref 12.0–15.0)
Potassium: 3.5 mEq/L — ABNORMAL LOW (ref 3.7–5.3)
SODIUM: 142 meq/L (ref 137–147)

## 2013-05-25 SURGERY — ARTHROSCOPY, KNEE, WITH MEDIAL MENISCECTOMY
Anesthesia: General | Site: Knee | Laterality: Right

## 2013-05-25 MED ORDER — SODIUM CHLORIDE 0.9 % IR SOLN
Status: DC | PRN
  Administered 2013-05-25: 6000 mL

## 2013-05-25 MED ORDER — DEXAMETHASONE SODIUM PHOSPHATE 4 MG/ML IJ SOLN
INTRAMUSCULAR | Status: DC | PRN
  Administered 2013-05-25: 8 mg via INTRAVENOUS

## 2013-05-25 MED ORDER — SODIUM CHLORIDE 0.9 % IV SOLN
INTRAVENOUS | Status: DC
  Filled 2013-05-25: qty 1000

## 2013-05-25 MED ORDER — FENTANYL CITRATE 0.05 MG/ML IJ SOLN
INTRAMUSCULAR | Status: DC | PRN
  Administered 2013-05-25 (×2): 50 ug via INTRAVENOUS

## 2013-05-25 MED ORDER — OXYCODONE-ACETAMINOPHEN 5-325 MG PO TABS
ORAL_TABLET | ORAL | Status: AC
  Filled 2013-05-25: qty 1

## 2013-05-25 MED ORDER — FENTANYL CITRATE 0.05 MG/ML IJ SOLN
INTRAMUSCULAR | Status: AC
  Filled 2013-05-25: qty 2

## 2013-05-25 MED ORDER — MIDAZOLAM HCL 2 MG/2ML IJ SOLN
INTRAMUSCULAR | Status: AC
  Filled 2013-05-25: qty 2

## 2013-05-25 MED ORDER — OXYCODONE-ACETAMINOPHEN 5-325 MG PO TABS: 1.0000 | ORAL_TABLET | Freq: Four times a day (QID) | ORAL | Status: DC | PRN

## 2013-05-25 MED ORDER — BUPIVACAINE HCL 0.25 % IJ SOLN
INTRAMUSCULAR | Status: DC | PRN
  Administered 2013-05-25: 20 mL

## 2013-05-25 MED ORDER — FENTANYL CITRATE 0.05 MG/ML IJ SOLN
25.0000 ug | INTRAMUSCULAR | Status: DC | PRN
  Administered 2013-05-25 (×2): 25 ug via INTRAVENOUS
  Filled 2013-05-25: qty 1

## 2013-05-25 MED ORDER — EPHEDRINE SULFATE 50 MG/ML IJ SOLN
INTRAMUSCULAR | Status: DC | PRN
  Administered 2013-05-25 (×3): 10 mg via INTRAVENOUS

## 2013-05-25 MED ORDER — LACTATED RINGERS IV SOLN
INTRAVENOUS | Status: DC
  Administered 2013-05-25: 13:00:00 via INTRAVENOUS
  Filled 2013-05-25: qty 1000

## 2013-05-25 MED ORDER — POVIDONE-IODINE 7.5 % EX SOLN
Freq: Once | CUTANEOUS | Status: DC
  Filled 2013-05-25: qty 118

## 2013-05-25 MED ORDER — CEFAZOLIN SODIUM-DEXTROSE 2-3 GM-% IV SOLR
2.0000 g | INTRAVENOUS | Status: AC
  Administered 2013-05-25: 2 g via INTRAVENOUS
  Filled 2013-05-25: qty 50

## 2013-05-25 MED ORDER — ONDANSETRON HCL 4 MG/2ML IJ SOLN
INTRAMUSCULAR | Status: DC | PRN
  Administered 2013-05-25: 4 mg via INTRAVENOUS

## 2013-05-25 MED ORDER — OXYCODONE-ACETAMINOPHEN 5-325 MG PO TABS
1.0000 | ORAL_TABLET | ORAL | Status: AC | PRN
  Administered 2013-05-25: 1 via ORAL
  Filled 2013-05-25: qty 1

## 2013-05-25 MED ORDER — FENTANYL CITRATE 0.05 MG/ML IJ SOLN
INTRAMUSCULAR | Status: AC
  Filled 2013-05-25: qty 4

## 2013-05-25 MED ORDER — MIDAZOLAM HCL 5 MG/5ML IJ SOLN
INTRAMUSCULAR | Status: DC | PRN
  Administered 2013-05-25: 2 mg via INTRAVENOUS

## 2013-05-25 MED ORDER — MORPHINE SULFATE 4 MG/ML IJ SOLN
INTRAMUSCULAR | Status: DC | PRN
  Administered 2013-05-25: 4 mg via INTRAVENOUS

## 2013-05-25 MED ORDER — PROMETHAZINE HCL 25 MG/ML IJ SOLN
6.2500 mg | INTRAMUSCULAR | Status: DC | PRN
  Filled 2013-05-25: qty 1

## 2013-05-25 MED ORDER — MORPHINE SULFATE 4 MG/ML IJ SOLN
INTRAMUSCULAR | Status: AC
  Filled 2013-05-25: qty 1

## 2013-05-25 MED ORDER — ASPIRIN EC 325 MG PO TBEC: 325.0000 mg | DELAYED_RELEASE_TABLET | Freq: Two times a day (BID) | ORAL | Status: DC

## 2013-05-25 MED ORDER — CEPHALEXIN 500 MG PO CAPS: 500.0000 mg | ORAL_CAPSULE | Freq: Three times a day (TID) | ORAL | Status: DC

## 2013-05-25 MED ORDER — LIDOCAINE HCL (CARDIAC) 20 MG/ML IV SOLN
INTRAVENOUS | Status: DC | PRN
  Administered 2013-05-25: 80 mg via INTRAVENOUS

## 2013-05-25 MED ORDER — PROPOFOL 10 MG/ML IV BOLUS
INTRAVENOUS | Status: DC | PRN
  Administered 2013-05-25: 200 mg via INTRAVENOUS

## 2013-05-25 SURGICAL SUPPLY — 47 items
BANDAGE ESMARK 6X9 LF (GAUZE/BANDAGES/DRESSINGS) ×1 IMPLANT
BLADE 4.2CUDA (BLADE) IMPLANT
BLADE CUDA GRT WHITE 3.5 (BLADE) ×2 IMPLANT
BLADE CUDA SHAVER 3.5 (BLADE) IMPLANT
BNDG CMPR 9X6 STRL LF SNTH (GAUZE/BANDAGES/DRESSINGS) ×1
BNDG ESMARK 6X9 LF (GAUZE/BANDAGES/DRESSINGS) ×2
BNDG GAUZE ELAST 4 BULKY (GAUZE/BANDAGES/DRESSINGS) ×4 IMPLANT
CANISTER SUCT LVC 12 LTR MEDI- (MISCELLANEOUS) ×3 IMPLANT
CANISTER SUCTION 1200CC (MISCELLANEOUS) ×1 IMPLANT
CLOTH BEACON ORANGE TIMEOUT ST (SAFETY) ×2 IMPLANT
CUFF TOURNIQUET SINGLE 34IN LL (TOURNIQUET CUFF) ×2 IMPLANT
DRAPE ARTHROSCOPY W/POUCH 114 (DRAPES) ×2 IMPLANT
DRAPE INCISE 23X17 IOBAN STRL (DRAPES) ×1
DRAPE INCISE 23X17 STRL (DRAPES) ×1 IMPLANT
DRAPE INCISE IOBAN 23X17 STRL (DRAPES) ×1 IMPLANT
DRAPE INCISE IOBAN 66X45 STRL (DRAPES) ×2 IMPLANT
DRSG PAD ABDOMINAL 8X10 ST (GAUZE/BANDAGES/DRESSINGS) ×1 IMPLANT
DURAPREP 26ML APPLICATOR (WOUND CARE) ×2 IMPLANT
ELECT MENISCUS 165MM 90D (ELECTRODE) IMPLANT
ELECT REM PT RETURN 9FT ADLT (ELECTROSURGICAL)
ELECTRODE REM PT RTRN 9FT ADLT (ELECTROSURGICAL) IMPLANT
GAUZE XEROFORM 1X8 LF (GAUZE/BANDAGES/DRESSINGS) ×2 IMPLANT
GLOVE BIO SURGEON STRL SZ7.5 (GLOVE) ×2 IMPLANT
GLOVE INDICATOR 8.0 STRL GRN (GLOVE) ×4 IMPLANT
GLOVE SURG ORTHO 8.0 STRL STRW (GLOVE) ×2 IMPLANT
GOWN STRL REUS W/ TWL LRG LVL3 (GOWN DISPOSABLE) IMPLANT
GOWN STRL REUS W/ TWL XL LVL3 (GOWN DISPOSABLE) IMPLANT
GOWN STRL REUS W/TWL LRG LVL3 (GOWN DISPOSABLE) ×2
GOWN STRL REUS W/TWL XL LVL3 (GOWN DISPOSABLE) ×2
KNEE WRAP E Z 3 GEL PACK (MISCELLANEOUS) ×2 IMPLANT
MINI VAC (SURGICAL WAND) ×2 IMPLANT
NEEDLE HYPO 22GX1.5 SAFETY (NEEDLE) ×2 IMPLANT
PACK ARTHROSCOPY DSU (CUSTOM PROCEDURE TRAY) ×2 IMPLANT
PACK BASIN DAY SURGERY FS (CUSTOM PROCEDURE TRAY) ×2 IMPLANT
PAD ABD 8X10 STRL (GAUZE/BANDAGES/DRESSINGS) ×4 IMPLANT
PADDING CAST ABS 4INX4YD NS (CAST SUPPLIES) ×1
PADDING CAST ABS COTTON 4X4 ST (CAST SUPPLIES) ×1 IMPLANT
PENCIL BUTTON HOLSTER BLD 10FT (ELECTRODE) IMPLANT
SET ARTHROSCOPY TUBING (MISCELLANEOUS) ×2
SET ARTHROSCOPY TUBING LN (MISCELLANEOUS) ×1 IMPLANT
SPONGE GAUZE 4X4 12PLY (GAUZE/BANDAGES/DRESSINGS) ×2 IMPLANT
SUT ETHILON 4 0 PS 2 18 (SUTURE) ×2 IMPLANT
SYR CONTROL 10ML LL (SYRINGE) ×2 IMPLANT
TOWEL OR 17X24 6PK STRL BLUE (TOWEL DISPOSABLE) ×2 IMPLANT
WAND 30 DEG SABER W/CORD (SURGICAL WAND) IMPLANT
WAND 90 DEG TURBOVAC W/CORD (SURGICAL WAND) IMPLANT
WATER STERILE IRR 500ML POUR (IV SOLUTION) ×2 IMPLANT

## 2013-05-25 NOTE — Transfer of Care (Signed)
Immediate Anesthesia Transfer of Care Note  Patient: Laura [name]  Procedure(s) Performed: Procedure(s) (LRB): RIGHT KNEE ARTHROSCOPY WITH PARTIAL LATERAL and MEDIAL MENISECTOMY AND CHONDROPLASTY (Right)  Patient Location: PACU  Anesthesia Type: General  Level of Consciousness: awake, alert  and oriented  Airway & Oxygen Therapy: Patient Spontanous Breathing and Patient connected to face mask oxygen  Post-op Assessment: Report given to PACU RN and Post -op Vital signs reviewed and stable  Post vital signs: Reviewed and stable  Complications: No apparent anesthesia complications

## 2013-05-25 NOTE — Op Note (Signed)
Preop diagnosis right knee possible torn lateral meniscus early osteoarthritis Postoperative diagnosis 1 right knee complex tear lateral meniscus. Includes small tear medial meniscus. 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 2 chondroplasty lateral tibial plateau Surgeon Laura [name] Persistent Laura Mages PA-C Anesthesia Gen. and drawer operative knee block History blood loss minimal Drains none Complications none Disposition to PACU stable Operative details Patient counseling holding area cracks identified and marked. Chart reviewed. IV started. Only the operating room IV and posterior given. In the or placed supine on the. Posterior general anesthesia. Right thigh patient to thigh holder prepped with DuraPrep and draped into sterile fashion. Comment was done. Exsanguinated Esmarch inflated 250 mm mercury. Arthroscopic portals were established proximal medial inferomedial inferolateral. Patellofemoral joint revealed mild with the combination of the proximal lateral patella facet in light chondroplasty was performed careful tracked anatomically stroke unremarkable souped L. pouch the lateral gutter synovium normal. Anterior posterior cruciate ligament intact medial compartment inspected to cartilage healthy there were small degenerative type tear the posterior horn U. meniscus utilizing the shaver and Mityvac system very small partial meniscectomy was performed back to stable base.  [place] Lighter inspected there was grade to 3, show to plateau mechanical chondroplasty former shaver numbness or showed marked tearing multiple areas. Utilizing baskets and shaver and Mityvac system a partial lateral meniscectomy 4 right stable. There is no other abnormalities noted across [place] was removed. The case 10 cc of 0.5% Sensorcaine placed in skin and 10 cc of and 5 Sensorcaine and 4 mg of  morphine sulfate was injected intra-articular. Sterile dressings applied tourniquet deflated normal circulation and foot and ankle tight from operating room to PACU in stable condition.

## 2013-05-25 NOTE — Interval H&P Note (Signed)
History and Physical Interval Note:  05/25/2013 1:31 PM  Laura [name]  has presented today for surgery, with the diagnosis of RIGHT KNEE [place]  The various methods of treatment have been discussed with the patient and family. After consideration of risks, benefits and other options for treatment, the patient has consented to  Procedure(s): RIGHT KNEE ARTHROSCOPY WITH PARTIAL LATERAL MENISECTOMY AND CHONDROPLASTY (Right) as a surgical intervention .  The patient's history has been reviewed, patient examined, no change in status, stable for surgery.  I have reviewed the patient's chart and labs.  Questions were answered to the patient's satisfaction.     Sydnee Cabal

## 2013-05-25 NOTE — Anesthesia Preprocedure Evaluation (Signed)
Anesthesia Evaluation  Patient identified by MRN, date of birth, ID band Patient awake    Reviewed: Allergy & Precautions, H&P , NPO status , Patient's Chart, lab work & pertinent test results  Airway Mallampati: II TM Distance: >3 FB Neck ROM: Full    Dental no notable dental hx.    Pulmonary neg pulmonary ROS, Current Smoker,  breath sounds clear to auscultation  Pulmonary exam normal       Cardiovascular hypertension, Pt. on medications Rhythm:Regular Rate:Normal     Neuro/Psych Anxiety Unspecified hereditary and idiopathic peripheral neuropathy.  Neuromuscular disease    GI/Hepatic hiatal hernia, GERD-  Medicated,(+) Hepatitis -, BHep B antibody positive.   Endo/Other  negative endocrine ROS  Renal/GU negative Renal ROS  negative genitourinary   Musculoskeletal negative musculoskeletal ROS (+)   Abdominal   Peds negative pediatric ROS (+)  Hematology negative hematology ROS (+)   Anesthesia Other Findings   Reproductive/Obstetrics negative OB ROS                           Anesthesia Physical Anesthesia Plan  ASA: II  Anesthesia Plan: General   Post-op Pain Management:    Induction: Intravenous  Airway Management Planned: LMA  Additional Equipment:   Intra-op Plan:   Post-operative Plan: Extubation in OR  Informed Consent: I have reviewed the patients History and Physical, chart, labs and discussed the procedure including the risks, benefits and alternatives for the proposed anesthesia with the patient or authorized representative who has indicated his/her understanding and acceptance.   Dental advisory given  Plan Discussed with: CRNA  Anesthesia Plan Comments:         Anesthesia Quick Evaluation

## 2013-05-25 NOTE — Anesthesia Procedure Notes (Signed)
Procedure Name: LMA Insertion Date/Time: 05/25/2013 2:46 PM Performed by: Mechele Claude Pre-anesthesia Checklist: Patient identified, Emergency Drugs available, Suction available and Patient being monitored Patient Re-evaluated:Patient Re-evaluated prior to inductionOxygen Delivery Method: Circle System Utilized Preoxygenation: Pre-oxygenation with 100% oxygen Intubation Type: IV induction Ventilation: Mask ventilation without difficulty LMA: LMA inserted LMA Size: 4.0 Number of attempts: 1 Airway Equipment and Method: bite block Placement Confirmation: positive ETCO2 Tube secured with: Tape Dental Injury: Teeth and Oropharynx as per pre-operative assessment

## 2013-05-25 NOTE — Discharge Instructions (Signed)

## 2013-05-25 NOTE — H&P (View-Only) (Signed)
NPO AFTER MN WITH EXCEPTION CLEAR LIQUIDS UNTIL 0800 (NO CREAM/ MILK PRODUCTS).  ARRIVE AT 1215. NEEDS ISTAT AND EKG. WILL TAKE GABAPENTIN, PROTONIX, LOSARTAN, ATIVAN AND IF NEEDED MAY TAKE HYDROCODONE.

## 2013-05-25 NOTE — H&P (Signed)
Laura [name] is an 58 y.o. female.   Chief Complaint: Right knee pain HPI: Patient presents with right knee discomfort that had been persistent for several months now. Despite conservative treatments, her discomfort has not improved. Imaging was obtained. Other conservative and surgical treatments were discussed in detail. Patient wishes to proceed with surgery as consented. Denies SOB, CP, or calf pain. No Fever, chills, or nausea/ vomiting.   Past Medical History  Diagnosis Date  . Anxiety   . Right knee meniscal tear   . OA (osteoarthritis) of knee     RIGHT  . Unspecified hereditary and idiopathic peripheral neuropathy   . Hypertension   . RLS (restless legs syndrome)   . GERD (gastroesophageal reflux disease)   . H/O hiatal hernia   . Hepatitis B antibody positive   . IBS (irritable bowel syndrome)     Past Surgical History  Procedure Laterality Date  . Carpal tunnel release Right 1995  . Lumbar disc surgery  08-22-2003    LEFT  L5  --  S1  . Cholecystectomy  1996  . Carpal tunnel release Right 1990's  . Total abdominal hysterectomy w/ bilateral salpingoophorectomy  1999  . Anterior cervical decomp/discectomy fusion  2000    C4 -- C5    Family History  Problem Relation Age of Onset  . Cancer Mother   . HIV/AIDS Father    Social History:  reports that she has been smoking Cigarettes.  She has a 15 pack-year smoking history. She has never used smokeless tobacco. She reports that she drinks alcohol. She reports that she does not use illicit drugs.  Allergies:  Allergies  Allergen Reactions  . Celebrex [Celecoxib] Hives    Medications Prior to Admission  Medication Sig Dispense Refill  . diazepam (VALIUM) 5 MG tablet Take 1 tablet by mouth every 8 (eight) hours as needed.       . dicyclomine (BENTYL) 20 MG tablet Take 20 mg by mouth 3 (three) times daily as needed.       . gabapentin (NEURONTIN) 100 MG capsule Take 100 mg by mouth 3 (three) times daily. Take 1 in  AM, 1 midday and 2 at bedtime      . hydrochlorothiazide (MICROZIDE) 12.5 MG capsule Take 12.5 mg by mouth every morning.       Marland Kitchen HYDROcodone-acetaminophen (NORCO) 10-325 MG per tablet Take 1 tablet by mouth every 4 (four) hours as needed.       Marland Kitchen ibuprofen (ADVIL,MOTRIN) 200 MG tablet Take 200 mg by mouth every 6 (six) hours as needed.      Marland Kitchen LORazepam (ATIVAN) 0.5 MG tablet Take 0.5 mg by mouth every 4 (four) hours as needed.       Marland Kitchen losartan (COZAAR) 100 MG tablet Take 100 mg by mouth every morning.       . Multiple Vitamin (MULTIVITAMIN) tablet Take 1 tablet by mouth daily.      . naproxen sodium (ANAPROX) 220 MG tablet Take 220 mg by mouth 2 (two) times daily as needed.      . Omega-3 Fatty Acids (FISH OIL) 1000 MG CAPS Take 2 capsules by mouth daily.      . pantoprazole (PROTONIX) 40 MG tablet Take 40 mg by mouth 2 (two) times daily.       Marland Kitchen zolpidem (AMBIEN) 10 MG tablet Take 10 mg by mouth at bedtime as needed.         No results found for this or any previous visit (  from the past 48 hour(s)). No results found.  Review of Systems  Constitutional: Negative.   HENT: Negative.   Eyes: Negative.   Respiratory: Positive for cough. Negative for shortness of breath.   Cardiovascular: Negative.   Gastrointestinal: Negative.   Genitourinary: Negative.   Musculoskeletal: Positive for joint pain.  Skin: Negative.   Neurological: Negative.   Endo/Heme/Allergies: Negative.   Psychiatric/Behavioral: The patient is nervous/anxious.     Blood pressure 124/80, pulse 77, temperature 97.6 F (36.4 C), temperature source Oral, resp. rate 16, height 5\' 2"  (1.575 m), weight 63.73 kg (140 lb 8 oz), SpO2 97.00%. Physical Exam  Constitutional: She is oriented to person, place, and time. She appears well-developed.  HENT:  Head: Normocephalic.  Eyes: EOM are normal.  Neck: Normal range of motion.  Cardiovascular: Normal rate, normal heart sounds and intact distal pulses.   Respiratory: Effort  normal and breath sounds normal.  GI: Soft.  Genitourinary:  deferred  Musculoskeletal: She exhibits edema and tenderness.  Right knee. Calf soft and non tender.  Neurological: She is alert and oriented to person, place, and time.  Skin: Skin is warm and dry.  Psychiatric: Her behavior is normal.     Assessment/Plan Right knee LMT and OA: Arthroscopy for LM and chondroplasty D/c home today Take meds as directed F/U in the office in 7days Follow d/c instructions   Lajean Manes 05/25/2013, 12:46 PM

## 2013-05-26 NOTE — Anesthesia Postprocedure Evaluation (Signed)
  Anesthesia Post-op Note  Patient: Laura [name]  Procedure(s) Performed: Procedure(s) (LRB): RIGHT KNEE ARTHROSCOPY WITH PARTIAL LATERAL and MEDIAL MENISECTOMY AND CHONDROPLASTY (Right)  Patient Location: PACU  Anesthesia Type: General  Level of Consciousness: awake and alert   Airway and Oxygen Therapy: Patient Spontanous Breathing  Post-op Pain: mild  Post-op Assessment: Post-op Vital signs reviewed, Patient's Cardiovascular Status Stable, Respiratory Function Stable, Patent Airway and No signs of Nausea or vomiting  Last Vitals:  Filed Vitals:   05/25/13 1729  BP: 134/81  Pulse: 90  Temp: 36.1 C  Resp: 18    Post-op Vital Signs: stable   Complications: No apparent anesthesia complications

## 2013-05-28 ENCOUNTER — Encounter (HOSPITAL_BASED_OUTPATIENT_CLINIC_OR_DEPARTMENT_OTHER): Payer: Self-pay | Admitting: Specialist

## 2013-05-29 ENCOUNTER — Other Ambulatory Visit: Payer: Self-pay | Admitting: Neurology

## 2013-06-12 ENCOUNTER — Ambulatory Visit (INDEPENDENT_AMBULATORY_CARE_PROVIDER_SITE_OTHER): Payer: BC Managed Care – PPO | Admitting: Neurology

## 2013-06-12 ENCOUNTER — Encounter: Payer: Self-pay | Admitting: Neurology

## 2013-06-12 VITALS — BP 161/92 | HR 90 | Temp 98.1°F | Ht 62.0 in | Wt 140.0 lb

## 2013-06-12 DIAGNOSIS — G609 Hereditary and idiopathic neuropathy, unspecified: Secondary | ICD-10-CM

## 2013-06-12 DIAGNOSIS — M79671 Pain in right foot: Secondary | ICD-10-CM

## 2013-06-12 DIAGNOSIS — M79672 Pain in left foot: Secondary | ICD-10-CM

## 2013-06-12 DIAGNOSIS — M79609 Pain in unspecified limb: Secondary | ICD-10-CM

## 2013-06-12 DIAGNOSIS — G2581 Restless legs syndrome: Secondary | ICD-10-CM

## 2013-06-12 NOTE — Progress Notes (Signed)
Subjective:    Patient ID: Laura [name] is a 58 y.o. female.  HPI    Interim history:   Laura [name] is a very pleasant 58 year old right-handed woman with an underlying medical history of hypertension, reflux disease, insomnia, and peripheral neuropathy who presents for followup consultation of her neuropathy and complaint of foot pain. She is unaccompanied today. I last saw her on 10/24/2012, at which time I did not suggest any medication changes but added more blood work. We did some PT. We talked about her prior blood work and an electrophysiological test results in detail last time. I added CK, HTLV antibodies, vitamin B6, heavy metal screen in blood/urine. All of these were fine. We called her with the test results. In the interim, she had right knee arthroscopic surgery on 05/26/2013 for meniscus tears.  Today, she tells me that her son has been in and out of jail. They have adopted her GD, age 49 and they have had her since she was 36 mo old. She filed for disability. She had neck and back surgery under Dr. Vertell Limber many years ago (neck in '98 and lower back in '01) and has not seen Dr. Vertell Limber in years. She has some neck stiffness and some L sided sciatica. She still has burning foot pain. She had a recent URI, and had recurrent UTIs. She is going to see a urologist. She takes gabapentin 100 mg - 100 mg - 200 mg. She does have some sleepiness from it. She takes Ambien for sleep each night.  She still has a lot of stress. She is on a low dose of Wellbutrin. She is in outpatient PT for her knee. She has had some blurry vision. She has been on ABx recently and is current on ABx and prednisone. She has mild RLS Sx, but not all the time.   I first met her on 07/27/2012, at which time she was on Valium, hydrocodone, gabapentin, Protonix, prednisone 5 mg daily, Ambien as needed, Norvasc, Wellbutrin, Fosamax, Flexeril, magnesium oxide. 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 Her half sister was diagnosed  with neuropathy and the patient saw a podiatrist, who did X-rays and told her she had plantar fasciitis. She c/o paresthesias, cramping, burning sensation, which started in her soles and advanced to her foot and ankle areas bilaterally. She also endorsed some RLS symptoms. She has been given a cream by her foot doctor and when she started gabapentin, she had blisters in her mouth, which resolved. She had one syncopal event at work after starting gabapentin for a few days and after a week she fell at home, bumping her head without LOC reported. She had no further issues like this. She had had dizziness and lightheadedness and pre-syncopal spells even before she started the gabapentin. She had been taking the hydrocodone sparingly as she raises her 59 yo GD and knows the narcotic sedates her. She endorsed significant stressors, including losing her daughter to MI some 2 years ago and her GD's father (pt's son) has had issues with drugs, she reported.  She works full-time at a [employer] and her job involves Media planner. She tries not to take hydrocodone or Valium at work. She does complaining of considerable pain during the day and the work week has been exceedingly difficult for her. She has 8 or 10 hour days. She was placed on FMLA by her PCP. She was wondering whether her significant stressors were contributing to her problem.  At the time  of her visit in 6/14 I suggested further blood work as well as EMG and nerve conduction studies. She had the EMG and nerve conduction test on 08/24/2012: normal nerve conduction, normal EMG. There was no evidence of peripheral neuropathy, however, small fiber neuropathy may be missed by a standard nerve conduction test.   Her blood work was unremarkable and I we called her on 08/02/2012 with the results. She had normal vitamin D, normal B12, normal CBC, normal CMP and the only thing that came back mildly abnormal was a borderline low vitamin D and she was  advised to start an over-the-counter vitamin D supplement. She had mildly elevated platelets which was nonspecific.   Her Past Medical History Is Significant For: Past Medical History  Diagnosis Date  . Anxiety   . Right knee meniscal tear   . OA (osteoarthritis) of knee     RIGHT  . Unspecified hereditary and idiopathic peripheral neuropathy   . Hypertension   . RLS (restless legs syndrome)   . GERD (gastroesophageal reflux disease)   . H/O hiatal hernia   . Hepatitis B antibody positive   . IBS (irritable bowel syndrome)     Her Past Surgical History Is Significant For: Past Surgical History  Procedure Laterality Date  . Carpal tunnel release Right 1995  . Lumbar disc surgery  08-22-2003    LEFT  L5  --  S1  . Cholecystectomy  1996  . Carpal tunnel release Right 1990's  . Total abdominal hysterectomy w/ bilateral salpingoophorectomy  1999  . Anterior cervical decomp/discectomy fusion  2000    C4 -- C5  . Knee arthroscopy with medial menisectomy Right 05/25/2013    Procedure: RIGHT KNEE ARTHROSCOPY WITH PARTIAL LATERAL and MEDIAL MENISECTOMY AND CHONDROPLASTY;  Surgeon: Sydnee Cabal, MD;  Location: [facility];  Service: Orthopedics;  Laterality: Right;    Her Family History Is Significant For: Family History  Problem Relation Age of Onset  . Cancer Mother   . HIV/AIDS Father     Her Social History Is Significant For: History   Social History  . Marital Status: Married    Spouse Name: N/A    Number of Children: N/A  . Years of Education: N/A   Social History Main Topics  . Smoking status: Current Every Day Smoker -- 0.75 packs/day for 20 years    Types: Cigarettes  . Smokeless tobacco: Never Used  . Alcohol Use: Yes     Comment: VERY RARE  . Drug Use: No  . Sexual Activity: None   Other Topics Concern  . None   Social History Narrative  . None    Her Allergies Are:  Allergies  Allergen Reactions  . Celebrex [Celecoxib] Hives  :    Her Current Medications Are:  Outpatient Encounter Prescriptions as of 06/12/2013  Medication Sig  . aspirin EC 325 MG tablet Take 1 tablet (325 mg total) by mouth 2 (two) times daily.  Marland Kitchen buPROPion (WELLBUTRIN) 100 MG tablet Take 1 tablet by mouth daily.  . diazepam (VALIUM) 5 MG tablet Take 1 tablet by mouth every 8 (eight) hours as needed.   . dicyclomine (BENTYL) 20 MG tablet Take 20 mg by mouth 3 (three) times daily as needed.   . diltiazem (CARDIZEM) 120 MG tablet Take 1 tablet by mouth daily.  Marland Kitchen doxycycline (ADOXA) 100 MG tablet Take 1 tablet by mouth daily.  Marland Kitchen gabapentin (NEURONTIN) 100 MG capsule TAKE 1 CAPSULE BY MOUTH EVERY MORNING, 1 MIDDAY AND  2 AT BEDTIME  . hydrochlorothiazide (MICROZIDE) 12.5 MG capsule Take 12.5 mg by mouth every morning.   Marland Kitchen HYDROcodone-acetaminophen (NORCO) 10-325 MG per tablet Take 1 tablet by mouth every 6 (six) hours as needed.  Marland Kitchen LORazepam (ATIVAN) 0.5 MG tablet Take 0.5 mg by mouth every 4 (four) hours as needed.   Marland Kitchen losartan (COZAAR) 100 MG tablet Take 100 mg by mouth every morning.   . Multiple Vitamin (MULTIVITAMIN) tablet Take 1 tablet by mouth daily.  . Omega-3 Fatty Acids (FISH OIL) 1000 MG CAPS Take 2 capsules by mouth daily.  . pantoprazole (PROTONIX) 40 MG tablet Take 40 mg by mouth 2 (two) times daily.   . predniSONE (DELTASONE) 20 MG tablet Take 2 tablets by mouth daily.  Marland Kitchen zolpidem (AMBIEN) 10 MG tablet Take 10 mg by mouth at bedtime as needed.   . traMADol (ULTRAM) 50 MG tablet Take 1 tablet by mouth every 6 (six) hours as needed.  . [DISCONTINUED] cephALEXin (KEFLEX) 500 MG capsule Take 1 capsule (500 mg total) by mouth 3 (three) times daily.  . [DISCONTINUED] oxyCODONE-acetaminophen (ROXICET) 5-325 MG per tablet Take 1-2 tablets by mouth every 6 (six) hours as needed for severe pain.  :  Review of Systems:  Out of a complete 14 point review of systems, all are reviewed and negative with the exception of these symptoms as listed below:    Review of Systems  Constitutional: Positive for fatigue.  Eyes: Positive for visual disturbance (blurred vision).  Respiratory: Negative.   Cardiovascular: Negative.   Gastrointestinal: Positive for abdominal pain, diarrhea and constipation.  Endocrine: Positive for polydipsia.  Genitourinary: Positive for hematuria.  Musculoskeletal: Positive for arthralgias, gait problem and myalgias.  Skin: Negative.   Allergic/Immunologic: Positive for environmental allergies.  Neurological: Positive for dizziness, weakness, numbness and headaches.       Memory loss  Hematological: Negative.   Psychiatric/Behavioral: Positive for dysphoric mood. The patient is nervous/anxious.     Objective:  Neurologic Exam  Physical Exam Physical Examination:   Filed Vitals:   06/12/13 1150  BP: 161/92  Pulse: 90  Temp: 98.1 F (36.7 C)   General Examination: The patient is a very pleasant 58 y.o. female in no acute distress. She appears well-developed and well-nourished and adequately groomed. She is mildly anxious appearing and does come close to tears.  HEENT: Normocephalic, atraumatic, pupils are equal, round and reactive to light and accommodation. Extraocular tracking is good without limitation to gaze excursion or nystagmus noted. Normal smooth pursuit is noted. Funduscopy is normal. Hearing is grossly intact. Face is symmetric with normal facial animation and normal facial sensation. Speech is clear with no dysarthria noted. There is no hypophonia. There is no lip, neck/head, jaw or voice tremor. Neck is supple with full range of passive and active motion. There are no carotid bruits on auscultation. Oropharynx exam reveals: mild mouth dryness, adequate dental hygiene and no airway crowding. Mallampati is class II. Tongue protrudes centrally and palate elevates symmetrically. She sounds mildly congested.   Chest: Clear to auscultation without wheezing, rhonchi or crackles noted.  Heart: S1+S2+0,  regular and normal without murmurs, rubs or gallops noted.   Abdomen: Soft, non-tender and non-distended with normal bowel sounds appreciated on auscultation.  Extremities: There is no pitting edema in the distal lower extremities bilaterally. She has healing scars from the knee surgery with minimal swelling and some soreness. Pedal pulses are intact.   Skin: Warm and dry without trophic changes noted. There are no  varicose veins, except for a few prominent appearing veins..  Musculoskeletal: exam reveals no obvious joint deformities, tenderness or joint swelling or erythema.   Neurologically:  Mental status: The patient is awake, alert and oriented in all 4 spheres. Her memory, attention, language and knowledge are appropriate. There is no aphasia, agnosia, apraxia or anomia. Speech is clear with normal prosody and enunciation. Thought process is linear. Mood is congruent and affect is normal.  Cranial nerves are as described above under HEENT exam. In addition, shoulder shrug is normal with equal shoulder height noted. Motor exam: Normal bulk, strength and tone is noted. There is no drift, tremor or rebound. Romberg is negative. Reflexes are 2+ throughout. Toes are downgoing bilaterally. Fine motor skills are intact with normal finger taps, normal hand movements, normal rapid alternating patting, normal foot taps and normal foot agility.  Cerebellar testing shows no dysmetria or intention tremor on finger to nose testing. Heel to shin is unremarkable bilaterally, but difficult with the R leg d/t to some soreness in the R knee. There is no truncal or gait ataxia.  Sensory exam is intact to light touch, pinprick, vibration, temperature sense in the upper and lower extremities, with the exception of mild decrease in pinprick sensation and vibration sense in her feet bilaterally, L more than R.  Gait, station and balance: she walks with mild difficulty and uses her cane, but can also walk without. She  does have a slight limp on the R. No veering to one side is noted. No leaning to one side is noted. Posture is age-appropriate and stance is narrow based. No problems turning are noted. She turns en bloc.   Assessment and Plan:   In summary, Laura [name] is a very pleasant 58 year old female with an underlying medical history of hypertension, reflux disease, insomnia, recent R knee arthroscopic surgery, who has a history of burning foot pain and tingling as well as some restless leg symptoms, with the latter not being a big player at this time. She had normal ferritin about a year ago. Her history and physical exam are in keeping with mild peripheral neuropathy and there may be a hereditary component to it (she reported a FHx of PN). She has been on gabapentin for symptomatic relief, but has had some sedation. Thus far, her blood work was unremarkable and her EMG and NCV testing was normal in 7/14. Nevertheless, she may still have some small fiber neuropathy, but with unclear etiology at this time. She has had recurrent UTIs and has been on ABx recently, currently on ABx and prednisone for a URI. She has an appointment with urology tomorrow. Given her sedation, I am reluctant to increase her gabapentin. Lyrica may not add any additional value I worry. Therefore, I at this time. I do think it may be beneficial if we get a second opinion with one of my colleagues more experience in neuromuscular diseases. Therefore, I will request a second opinion with either Dr. Jannifer Franklin or Dr. Krista Blue in our office. She was in agreement.  I answered all her questions today and the patient was in agreement with the above outlined plan. I would like to see the patient back in about 6 months, sooner if the need arises and in the interim we will make sure she sees either Dr. Jannifer Franklin or Dr. Krista Blue. I encouraged her to call with any interim questions, concerns, problems or updates.

## 2013-06-12 NOTE — Patient Instructions (Signed)
I will request a second opinion with either Dr. Jannifer Franklin or Dr. Krista Blue for additional advice regarding diagnosis and management.  Please see your eye doctor for your new blurry vision.

## 2013-06-18 ENCOUNTER — Institutional Professional Consult (permissible substitution): Payer: BC Managed Care – PPO | Admitting: Neurology

## 2013-06-19 ENCOUNTER — Encounter: Payer: Self-pay | Admitting: Neurology

## 2013-06-19 ENCOUNTER — Ambulatory Visit (INDEPENDENT_AMBULATORY_CARE_PROVIDER_SITE_OTHER): Payer: BC Managed Care – PPO | Admitting: Neurology

## 2013-06-19 VITALS — BP 153/91 | HR 94 | Ht 62.0 in | Wt 142.0 lb

## 2013-06-19 DIAGNOSIS — Z9889 Other specified postprocedural states: Secondary | ICD-10-CM

## 2013-06-19 DIAGNOSIS — G609 Hereditary and idiopathic neuropathy, unspecified: Secondary | ICD-10-CM

## 2013-06-19 MED ORDER — GABAPENTIN 300 MG PO CAPS: 300.0000 mg | ORAL_CAPSULE | Freq: Three times a day (TID) | ORAL | Status: DC
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
## 2013-06-27 ENCOUNTER — Telehealth: Payer: Self-pay | Admitting: Neurology

## 2013-06-27 NOTE — Telephone Encounter (Signed)
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

## 2013-06-28 ENCOUNTER — Other Ambulatory Visit: Payer: Self-pay | Admitting: Neurology

## 2013-06-28 ENCOUNTER — Ambulatory Visit (INDEPENDENT_AMBULATORY_CARE_PROVIDER_SITE_OTHER): Payer: BC Managed Care – PPO

## 2013-06-28 DIAGNOSIS — G609 Hereditary and idiopathic neuropathy, unspecified: Secondary | ICD-10-CM

## 2013-06-28 DIAGNOSIS — Z9889 Other specified postprocedural states: Secondary | ICD-10-CM

## 2013-06-28 NOTE — Telephone Encounter (Signed)
Spoke with patient and she wanted to let Dr Krista Blue know about the cyst on Adrenal Gland/Kidney.  She also wanted to know could this be playing a role in what she is being treated for by Dr Tonie Griffith without diabetes).

## 2013-06-29 NOTE — Telephone Encounter (Signed)
Will address her questions on visit in June 3rd 2015.

## 2013-07-04 ENCOUNTER — Encounter: Payer: Self-pay | Admitting: Neurology

## 2013-07-04 ENCOUNTER — Ambulatory Visit (INDEPENDENT_AMBULATORY_CARE_PROVIDER_SITE_OTHER): Payer: BC Managed Care – PPO | Admitting: Neurology

## 2013-07-04 VITALS — BP 126/79 | HR 79 | Ht 62.0 in | Wt 140.0 lb

## 2013-07-04 DIAGNOSIS — G609 Hereditary and idiopathic neuropathy, unspecified: Secondary | ICD-10-CM

## 2013-07-04 DIAGNOSIS — Z9889 Other specified postprocedural states: Secondary | ICD-10-CM
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
## 2013-07-24 ENCOUNTER — Telehealth: Payer: Self-pay | Admitting: Neurology

## 2013-07-24 NOTE — Telephone Encounter (Signed)
Pt called would like for Dr. Krista Blue to call her back concerning her skin biopsy result. Please call pt back concerning this matter. Thanks

## 2013-07-24 NOTE — Telephone Encounter (Signed)
Laura [name], she was your patient, next appt in Nov 2015, please address

## 2013-07-24 NOTE — Telephone Encounter (Signed)
Pt calling requesting Dr. Krista Blue call her back concerning skin biopsy results. Please advise

## 2013-07-24 NOTE — Telephone Encounter (Signed)
Called pt to inform her per Dr. Krista Blue that pt's skin biopsy results were normal. Pt wanted to know what can she do at this point concerning her neuropathy, if everything is coming back normal and the medication is not helping. Pt's next OV is 12/14/13. Please advise

## 2013-07-24 NOTE — Telephone Encounter (Signed)
Please call patient for normal skin biopsy result.

## 2013-07-26 ENCOUNTER — Other Ambulatory Visit (HOSPITAL_COMMUNITY): Payer: Self-pay | Admitting: Family Medicine

## 2013-07-26 ENCOUNTER — Ambulatory Visit (HOSPITAL_COMMUNITY)
Admission: RE | Admit: 2013-07-26 | Discharge: 2013-07-26 | Disposition: A | Discharge status: Home / Self Care | Payer: Disability Insurance | Source: Ambulatory Visit | Attending: Family Medicine | Admitting: Family Medicine

## 2013-07-26 DIAGNOSIS — M25569 Pain in unspecified knee: Secondary | ICD-10-CM | POA: Insufficient documentation

## 2013-07-26 DIAGNOSIS — M25562 Pain in left knee: Secondary | ICD-10-CM

## 2013-07-26 DIAGNOSIS — M542 Cervicalgia: Secondary | ICD-10-CM

## 2013-07-26 DIAGNOSIS — M5030 Other cervical disc degeneration, unspecified cervical region: Secondary | ICD-10-CM | POA: Insufficient documentation

## 2013-07-26 DIAGNOSIS — M545 Low back pain: Secondary | ICD-10-CM

## 2013-07-26 DIAGNOSIS — M51379 Other intervertebral disc degeneration, lumbosacral region without mention of lumbar back pain or lower extremity pain: Secondary | ICD-10-CM | POA: Insufficient documentation

## 2013-07-26 DIAGNOSIS — M5137 Other intervertebral disc degeneration, lumbosacral region: Secondary | ICD-10-CM | POA: Insufficient documentation

## 2013-08-06 ENCOUNTER — Telehealth: Payer: Self-pay | Admitting: Neurology

## 2013-08-06 NOTE — Telephone Encounter (Signed)
Patient is experiencing leg and arm numbness after a stomach ache while on vacation last week.  Requesting an earlier appointment.  Please call and advise

## 2013-08-07 NOTE — Telephone Encounter (Signed)
Pt calling requesting earlier appt. Complains of numbness in leg and arm, pt has an appt set up on 12/14/13 with Dr. Rexene Alberts. Please advise

## 2013-08-07 NOTE — Telephone Encounter (Signed)
Called pt to inform her per Dr. Rexene Alberts that if the pt has acute onset of one-sided weakness, to go to the emergency room and if the pt has any other stomach related symptoms that the pt may need to see her PCP, but we still could move up pt's appt with Jeani Hawking, NP. Pt stated that she would see her PCP and if they thought that she needed to see Dr. Rexene Alberts, that she would call back to make an appt. I advised the pt that if she has any other problems, questions or concerns to call the office. Pt verbalized understanding. FYI

## 2013-08-07 NOTE — Telephone Encounter (Signed)
If she has acute onset of one-sided weakness, she may have to go to the emergency room. If she has any other stomach related symptoms she may need to see her primary care physician first. We can move up her appointment to see Charlott Holler as well.

## 2013-08-10 ENCOUNTER — Encounter: Payer: Self-pay | Admitting: Neurology

## 2013-09-06 NOTE — Telephone Encounter (Signed)
Noted  

## 2013-11-12 ENCOUNTER — Other Ambulatory Visit (HOSPITAL_COMMUNITY): Payer: Self-pay | Admitting: Specialist

## 2013-11-12 DIAGNOSIS — M25512 Pain in left shoulder: Secondary | ICD-10-CM

## 2013-11-15 ENCOUNTER — Ambulatory Visit (HOSPITAL_COMMUNITY)
Admission: RE | Admit: 2013-11-15 | Discharge: 2013-11-15 | Disposition: A | Discharge status: Home / Self Care | Payer: BC Managed Care – PPO | Source: Ambulatory Visit | Attending: Specialist | Admitting: Specialist

## 2013-11-15 DIAGNOSIS — M25512 Pain in left shoulder: Secondary | ICD-10-CM | POA: Diagnosis present

## 2013-11-15 DIAGNOSIS — M129 Arthropathy, unspecified: Secondary | ICD-10-CM | POA: Insufficient documentation

## 2013-12-14 ENCOUNTER — Encounter: Payer: Self-pay | Admitting: Neurology

## 2013-12-14 ENCOUNTER — Ambulatory Visit (INDEPENDENT_AMBULATORY_CARE_PROVIDER_SITE_OTHER): Payer: BC Managed Care – PPO | Admitting: Neurology

## 2013-12-14 VITALS — BP 125/77 | HR 97 | Temp 98.3°F | Ht 62.5 in | Wt 150.0 lb

## 2013-12-14 DIAGNOSIS — M79671 Pain in right foot: Secondary | ICD-10-CM

## 2013-12-14 DIAGNOSIS — M79672 Pain in left foot: Secondary | ICD-10-CM

## 2013-12-14 DIAGNOSIS — G629 Polyneuropathy, unspecified: Secondary | ICD-10-CM

## 2013-12-14 NOTE — Patient Instructions (Signed)
Let's try the gabapentin at 300 mg twice daily for now, if your burning sensation and foot discomfort increases significantly, you can go back to 1 pill 3 times a day.

## 2013-12-14 NOTE — Progress Notes (Signed)
Subjective:    Patient ID: Laura [name] is a 58 y.o. female.  HPI     Interim history:    Laura [name] is a very pleasant 58 year-old right-handed woman with an underlying medical history of hypertension, reflux disease, insomnia, and peripheral neuropathy who presents for followup consultation of her neuropathy and complaint of foot pain, concern for restless leg syndrome. She is unaccompanied today. I last saw her on 06/12/2013, at which time she reported significant stressors. She reported some neck stiffness and left-sided sciatica. Of note, she had neck surgery under Dr. Vertell Limber in 1998 and lower back surgery in 2001. She reported recurrent UTIs. She was going to see a urologist. She was on gabapentin 100 mg twice daily and 200 mg at night. She was in outpatient physical therapy. She was on antibiotics and prednisone for upper respiratory infection. She reported some residual restless leg symptoms. I suggested a second opinion with Dr. Krista Blue. she saw Dr. Krista Blue on 06/19/2013. She ordered a cervical spine MRI which was done on 06/29/2013: Abnormal MR scan of the cervical spine showing postoperative changes with a cervical fusion at C5-C6 with broad-based disc protrusion at C6-7 resulting in mild canal narrowing but without significant root impingement. Dr. Krista Blue also suggested a skin biopsy, this was normal. Dr. Krista Blue increased her gabapentin to 300 mg tid.   Today, she reports that she is now on Seroquel XR, which is 400 mg each night. She has been more groggy during the day. She sleeps, but does not wake up rested. She has gained some weight, maybe 15 lb in a month. She has not fallen recently, but fell some 3 months and scraped her R leg. Her R ankle get swollen off and of. She has had some memory issues, feeling sluggish and forgetful. She is s/p hysterectomy and woke up with vaginal bleeding. She had blood in the urine before. She had seen a urologist and a cystoscopy was done and normal per patient. Her  PCP ordered a CT pelvis, but this was denied by her insurance and she is in the process of seeing a new Gyn. She saw dermatology and had a mole removed which showed cancerous cells and she will need an excisional Bx. Her foot pain, burning, stinging is about the same, she feels. She has left shoulder pain. She had an MRI of the left shoulder last month per her orthopedic doctor. We reviewed the results together. She has mild arthritic changes and she states she may need arthroscopic surgery to the left shoulder.  I saw her on 10/24/2012, at which time I did not suggest any medication changes but added more blood work. We did some PT. We talked about her prior blood work and an electrophysiological test results in detail last time. I added CK, HTLV antibodies, vitamin B6, heavy metal screen in blood/urine. All of these were fine. We called her with the test results. In the interim, she had right knee arthroscopic surgery on 05/26/2013 for meniscus tears.  I first met her on 07/27/2012, at which time she was on Valium, hydrocodone, gabapentin, Protonix, prednisone 5 mg daily, Ambien as needed, Norvasc, Wellbutrin, Fosamax, Flexeril, magnesium oxide. 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 Her half sister was diagnosed with neuropathy and the patient saw a podiatrist, who did X-rays and told her she had plantar fasciitis. She c/o paresthesias, cramping, burning sensation, which started in her soles and advanced to her foot and ankle areas bilaterally. She also endorsed some  RLS symptoms. She has been given a cream by her foot doctor and when she started gabapentin, she had blisters in her mouth, which resolved. She had one syncopal event at work after starting gabapentin for a few days and after a week she fell at home, bumping her head without LOC reported. She had no further issues like this. She had had dizziness and lightheadedness and pre-syncopal spells even before she started the gabapentin. She had been  taking the hydrocodone sparingly as she raises her 59 yo GD and knows the narcotic sedates her. She endorsed significant stressors, including losing her daughter to MI some 2 years ago and her GD's father (pt's son) has had issues with drugs, she reported.   She works full-time at a [employer] and her job involves Media planner. She tries not to take hydrocodone or Valium at work. She does complaining of considerable pain during the day and the work week has been exceedingly difficult for her. She has 8 or 10 hour days. She was placed on FMLA by her PCP. She was wondering whether her significant stressors were contributing to her problem.   At the time of her visit in 6/14 I suggested further blood work as well as EMG and nerve conduction studies. She had the EMG and nerve conduction test on 08/24/2012: normal nerve conduction, normal EMG. There was no evidence of peripheral neuropathy, however, small fiber neuropathy may be missed by a standard nerve conduction test.   Her blood work was unremarkable and I we called her on 08/02/2012 with the results. She had normal vitamin D, normal B12, normal CBC, normal CMP and the only thing that came back mildly abnormal was a borderline low vitamin D and she was advised to start an over-the-counter vitamin D supplement. She had mildly elevated platelets which was nonspecific.   Her Past Medical History Is Significant For: Past Medical History  Diagnosis Date  . Anxiety   . Right knee meniscal tear   . OA (osteoarthritis) of knee     RIGHT  . Unspecified hereditary and idiopathic peripheral neuropathy   . Hypertension   . RLS (restless legs syndrome)   . GERD (gastroesophageal reflux disease)   . H/O hiatal hernia   . Hepatitis B antibody positive   . IBS (irritable bowel syndrome)     Her Past Surgical History Is Significant For: Past Surgical History  Procedure Laterality Date  . Carpal tunnel release Right 1995  . Lumbar disc surgery   08-22-2003    LEFT  L5  --  S1  . Cholecystectomy  1996  . Carpal tunnel release Right 1990's  . Total abdominal hysterectomy w/ bilateral salpingoophorectomy  1999  . Anterior cervical decomp/discectomy fusion  2000    C4 -- C5  . Knee arthroscopy with medial menisectomy Right 05/25/2013    Procedure: RIGHT KNEE ARTHROSCOPY WITH PARTIAL LATERAL and MEDIAL MENISECTOMY AND CHONDROPLASTY;  Surgeon: Sydnee Cabal, MD;  Location: [facility];  Service: Orthopedics;  Laterality: Right;    Her Family History Is Significant For: Family History  Problem Relation Age of Onset  . Cancer Mother   . HIV/AIDS Father     Her Social History Is Significant For: History   Social History  . Marital Status: Married    Spouse Name: [name]    Number of Children: 3  . Years of Education: GED   Occupational History  .      not employed   Social History  Main Topics  . Smoking status: Current Every Day Smoker -- 0.75 packs/day for 20 years    Types: Cigarettes  . Smokeless tobacco: Never Used  . Alcohol Use: Yes     Comment: VERY RARE  . Drug Use: No  . Sexual Activity: None   Other Topics Concern  . None   Social History Narrative   Patient lives at home with her husband [name]).    Patient is not working as time but she trying for disability.   Education 10th grade   Caffeine daily coffee and mountain dew . One can daily.   Marland Kitchen          Her Allergies Are:  Allergies  Allergen Reactions  . Celebrex [Celecoxib] Hives  :   Her Current Medications Are:  Outpatient Encounter Prescriptions as of 12/14/2013  Medication Sig  . aspirin EC 325 MG tablet Take 1 tablet (325 mg total) by mouth 2 (two) times daily.  . citalopram (CELEXA) 20 MG tablet   . diazepam (VALIUM) 5 MG tablet Take 1 tablet by mouth every 8 (eight) hours as needed.   . dicyclomine (BENTYL) 20 MG tablet Take 20 mg by mouth 3 (three) times daily as needed.   Marland Kitchen estradiol (ESTRACE) 0.1 MG/GM vaginal  cream Place 1 Applicatorful vaginally daily.  Marland Kitchen gabapentin (NEURONTIN) 300 MG capsule Take 1 capsule (300 mg total) by mouth 3 (three) times daily.  . hydrochlorothiazide (MICROZIDE) 12.5 MG capsule Take 12.5 mg by mouth every morning.   Marland Kitchen HYDROcodone-acetaminophen (NORCO) 10-325 MG per tablet Take 1 tablet by mouth every 6 (six) hours as needed.  Marland Kitchen losartan (COZAAR) 100 MG tablet Take 100 mg by mouth every morning.   . mirtazapine (REMERON) 15 MG tablet   . Multiple Vitamin (MULTIVITAMIN) tablet Take 1 tablet by mouth daily.  . Omega-3 Fatty Acids (FISH OIL) 1000 MG CAPS Take 2 capsules by mouth daily.  . pantoprazole (PROTONIX) 40 MG tablet Take 40 mg by mouth 2 (two) times daily.   . SEROQUEL XR 400 MG 24 hr tablet   . UNABLE TO FIND as needed. C Combination pain cream  . VESICARE 5 MG tablet daily. 1/2 tablet daily  . [DISCONTINUED] buPROPion (WELLBUTRIN) 100 MG tablet Take 1 tablet by mouth daily.  . [DISCONTINUED] cephALEXin (KEFLEX) 500 MG capsule 500 mg daily.  . [DISCONTINUED] ciprofloxacin (CIPRO) 500 MG tablet   . [DISCONTINUED] diltiazem (CARDIZEM) 120 MG tablet Take 1 tablet by mouth daily.  . [DISCONTINUED] gabapentin (NEURONTIN) 300 MG capsule Take 1 capsule (300 mg total) by mouth 3 (three) times daily.  . [DISCONTINUED] LORazepam (ATIVAN) 0.5 MG tablet Take 0.5 mg by mouth every 4 (four) hours as needed.   . [DISCONTINUED] oxyCODONE-acetaminophen (PERCOCET/ROXICET) 5-325 MG per tablet 5-325 tablets daily.  . [DISCONTINUED] traMADol (ULTRAM) 50 MG tablet Take 1 tablet by mouth every 6 (six) hours as needed.  . [DISCONTINUED] zolpidem (AMBIEN) 10 MG tablet Take 10 mg by mouth at bedtime as needed.   :  Review of Systems:  Out of a complete 14 point review of systems, all are reviewed and negative with the exception of these symptoms as listed below:   Review of Systems  Constitutional: Positive for appetite change.  Eyes:       Blurred vision  Gastrointestinal: Positive  for abdominal pain.  Endocrine:       Excessive thirst  Genitourinary:       Frequency of urination, blood in urine  Skin:  moles  Neurological: Positive for numbness.       Memory loss, restless leg, insomnia  Psychiatric/Behavioral: Positive for agitation. The patient is nervous/anxious.     Objective:  Neurologic Exam  Physical Exam Physical Examination:   Filed Vitals:   12/14/13 1124  BP: 125/77  Pulse: 97  Temp: 98.3 F (36.8 C)    General Examination: The patient is a very pleasant 58 y.o. female in no acute distress. She appears well-developed and well-nourished and adequately groomed. She is mildly anxious appearing and does come close to tears at times.  HEENT: Normocephalic, atraumatic, pupils are equal, round and reactive to light and accommodation. Extraocular tracking is good without limitation to gaze excursion or nystagmus noted. Normal smooth pursuit is noted. Funduscopy is normal. Hearing is grossly intact. Face is symmetric with normal facial animation and normal facial sensation. Speech is clear with no dysarthria noted. There is no hypophonia. There is no lip, neck/head, jaw or voice tremor. Neck is supple with full range of passive and active motion. There are no carotid bruits on auscultation. Oropharynx exam reveals: moderate mouth dryness, adequate dental hygiene and no airway crowding. Mallampati is class II. Tongue protrudes centrally and palate elevates symmetrically.    Chest: Clear to auscultation without wheezing, rhonchi or crackles noted.  Heart: S1+S2+0, regular and normal without murmurs, rubs or gallops noted.   Abdomen: Soft, non-tender and non-distended with normal bowel sounds appreciated on auscultation.  Extremities: There is no pitting edema in the distal lower extremities bilaterally. She has healing scars from the knee surgery with minimal swelling and some soreness. Pedal pulses are intact.   Skin: Warm and dry without trophic  changes noted. There are no varicose veins, except for a few prominent appearing veins.  Musculoskeletal: exam reveals no obvious joint deformities, tenderness or joint swelling or erythema, except for mild arthritic changes in both hands and decrease in ROM of the L shoulder.    Neurologically:  Mental status: The patient is awake, alert and oriented in all 4 spheres. Her memory, attention, language and knowledge are appropriate. There is no aphasia, agnosia, apraxia or anomia. Speech is clear with normal prosody and enunciation. Thought process is linear. Mood is congruent and affect is normal.  Cranial nerves are as described above under HEENT exam. In addition, shoulder shrug is normal with equal shoulder height noted. Motor exam: Normal bulk, strength and tone is noted. There is no drift, tremor or rebound. Romberg is negative. Reflexes are 2+ throughout, including ankles. Toes are downgoing bilaterally. Fine motor skills are intact with normal finger taps, normal hand movements, normal rapid alternating patting, normal foot taps and normal foot agility.  Cerebellar testing shows no dysmetria or intention tremor on finger to nose testing. Heel to shin is unremarkable bilaterally, but difficult with the R leg d/t to some soreness in the R knee. There is no truncal or gait ataxia.  Sensory exam is intact to light touch, pinprick, vibration, temperature sense in the upper and lower extremities, with the exception of mild decrease in pinprick sensation and vibration sense in her feet bilaterally, L more than R, stable from last time. Gait, station and balance: she walks with mild difficulty and uses her cane, but can also walk without. She does have a slight limp on the R, unchanged. No veering to one side is noted, but she reports she lists to the R at times. No leaning to one side is noted. Posture is age-appropriate and stance is narrow  based. No problems turning are noted. She turns in three steps.     Assessment and Plan:   In summary, LAKEIA BRADSHAW is a very pleasant 58 year old female with an underlying medical history of hypertension, reflux disease, mood disorder (she reports PTSD and depression, seen [facility], [place]), insomnia, s/p R knee arthroscopic surgery and L shoulder pain, who has a history of burning foot pain and tingling as well as some restless leg symptoms, with the latter not being a big player. Her neurological exam is stable. She reported a FHx of PN. She has been on gabapentin for symptomatic relief, but has had some sedation. In addition, she feels sedated from Seroquel. She is advised to bring this up next week when she has her follow-up appointment with psychiatry. Workup for neuropathy was unremarkable thus far. This includes blood work, EMG and NCV testing in 7/14, recent skin Bx and her C spine MRI showed post surgical changes. 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 Because of sedation which may be a result of a combination of things, I would like for her to try gabapentin 300 mg twice daily. She can take the first dose late in the morning early in the afternoon and the second dose at night. If she has flareup of foot pain and burning sensation or stinging feeling she can go back up to 1 pill 3 times a day. I will see her back routinely in 6 months, sooner if the need arises. She was in agreement.

## 2013-12-19 ENCOUNTER — Encounter: Payer: Self-pay | Admitting: *Deleted

## 2013-12-21 ENCOUNTER — Encounter: Payer: Self-pay | Admitting: Obstetrics & Gynecology

## 2013-12-21 ENCOUNTER — Ambulatory Visit (INDEPENDENT_AMBULATORY_CARE_PROVIDER_SITE_OTHER): Payer: BC Managed Care – PPO | Admitting: Obstetrics & Gynecology

## 2013-12-21 VITALS — BP 120/80 | Ht 62.0 in | Wt 142.0 lb

## 2013-12-21 DIAGNOSIS — N952 Postmenopausal atrophic vaginitis: Secondary | ICD-10-CM

## 2013-12-21 NOTE — Progress Notes (Signed)
Patient ID: Laura [name], female   DOB: 1955-02-04, 58 y.o.   MRN: [id number] Pt with hysterectomy 18 years ago due to fibroids and bleeding Off ERT x 12 years or so  had 1 episode/day of dark vaginal spottin  No pain No sex recently  Blood pressure 120/80, height 5\' 2"  (1.575 m), weight 142 lb (64.411 kg).  No burning with urination, frequency or urgency No nausea, vomiting or diarrhea Nor fever chills or other constitutional symptoms   Atrophic EFG vagian loss of folds and redundancy, atrophic mucosa, no lesions Cuff intact no lesions Uterus absent  adnexa negative  Atrophic vaginal changes  Pt is now taking estrace cream nightly

## 2014-01-01 ENCOUNTER — Other Ambulatory Visit: Payer: Self-pay | Admitting: Orthopedic Surgery

## 2014-01-05 HISTORY — PX: MOHS SURGERY: SUR867

## 2014-01-10 ENCOUNTER — Other Ambulatory Visit (HOSPITAL_COMMUNITY): Payer: Self-pay | Admitting: Physician Assistant

## 2014-01-10 ENCOUNTER — Encounter: Payer: Self-pay | Admitting: Obstetrics & Gynecology

## 2014-01-10 DIAGNOSIS — R1084 Generalized abdominal pain: Secondary | ICD-10-CM

## 2014-01-15 ENCOUNTER — Ambulatory Visit (HOSPITAL_COMMUNITY)
Admission: RE | Admit: 2014-01-15 | Discharge: 2014-01-15 | Disposition: A | Discharge status: Home / Self Care | Payer: BC Managed Care – PPO | Source: Ambulatory Visit | Attending: Physician Assistant | Admitting: Physician Assistant

## 2014-01-15 DIAGNOSIS — R1030 Lower abdominal pain, unspecified: Secondary | ICD-10-CM | POA: Insufficient documentation

## 2014-01-15 DIAGNOSIS — Z9049 Acquired absence of other specified parts of digestive tract: Secondary | ICD-10-CM | POA: Diagnosis not present

## 2014-01-15 DIAGNOSIS — K589 Irritable bowel syndrome without diarrhea: Secondary | ICD-10-CM | POA: Diagnosis not present

## 2014-01-15 DIAGNOSIS — M5136 Other intervertebral disc degeneration, lumbar region: Secondary | ICD-10-CM | POA: Diagnosis not present

## 2014-01-15 DIAGNOSIS — R1084 Generalized abdominal pain: Secondary | ICD-10-CM | POA: Diagnosis present

## 2014-01-15 DIAGNOSIS — J9811 Atelectasis: Secondary | ICD-10-CM | POA: Insufficient documentation

## 2014-01-15 DIAGNOSIS — I10 Essential (primary) hypertension: Secondary | ICD-10-CM | POA: Diagnosis not present

## 2014-01-15 DIAGNOSIS — D3502 Benign neoplasm of left adrenal gland: Secondary | ICD-10-CM | POA: Diagnosis not present

## 2014-01-15 DIAGNOSIS — Z90710 Acquired absence of both cervix and uterus: Secondary | ICD-10-CM | POA: Diagnosis not present

## 2014-01-15 MED ORDER — IOHEXOL 300 MG/ML  SOLN
100.0000 mL | Freq: Once | INTRAMUSCULAR | Status: AC | PRN
  Administered 2014-01-15: 100 mL via INTRAVENOUS

## 2014-01-17 ENCOUNTER — Encounter (HOSPITAL_BASED_OUTPATIENT_CLINIC_OR_DEPARTMENT_OTHER): Payer: Self-pay | Admitting: *Deleted

## 2014-01-18 ENCOUNTER — Encounter (HOSPITAL_BASED_OUTPATIENT_CLINIC_OR_DEPARTMENT_OTHER): Payer: Self-pay | Admitting: *Deleted

## 2014-01-18 NOTE — Progress Notes (Signed)
NPO AFTER MN WITH EXCEPTION CLEAR LIQUIDS UNTIL 0730 (NO CREAM/ MILK PRODUCTS). ARRIVE AT 1130. NEEDS ISTAT. CURRENT EKG IN CHART AND EPIC. WILL TAKE AM MEDS WITH EXCEPTION NO HCTZ W/ SIPS OF WATER.

## 2014-01-29 ENCOUNTER — Encounter (HOSPITAL_BASED_OUTPATIENT_CLINIC_OR_DEPARTMENT_OTHER): Payer: Self-pay

## 2014-01-29 ENCOUNTER — Ambulatory Visit (HOSPITAL_BASED_OUTPATIENT_CLINIC_OR_DEPARTMENT_OTHER)
Admission: RE | Admit: 2014-01-29 | Discharge: 2014-01-29 | Disposition: A | Discharge status: Home / Self Care | Payer: BC Managed Care – PPO | Source: Ambulatory Visit | Attending: Specialist | Admitting: Specialist

## 2014-01-29 ENCOUNTER — Encounter (HOSPITAL_BASED_OUTPATIENT_CLINIC_OR_DEPARTMENT_OTHER)
Admission: RE | Disposition: A | Discharge status: Home / Self Care | Payer: Self-pay | Source: Ambulatory Visit | Attending: Specialist

## 2014-01-29 ENCOUNTER — Ambulatory Visit (HOSPITAL_BASED_OUTPATIENT_CLINIC_OR_DEPARTMENT_OTHER): Payer: BC Managed Care – PPO | Admitting: Anesthesiology

## 2014-01-29 DIAGNOSIS — K219 Gastro-esophageal reflux disease without esophagitis: Secondary | ICD-10-CM | POA: Diagnosis not present

## 2014-01-29 DIAGNOSIS — Z85820 Personal history of malignant melanoma of skin: Secondary | ICD-10-CM | POA: Insufficient documentation

## 2014-01-29 DIAGNOSIS — G709 Myoneural disorder, unspecified: Secondary | ICD-10-CM | POA: Insufficient documentation

## 2014-01-29 DIAGNOSIS — I10 Essential (primary) hypertension: Secondary | ICD-10-CM | POA: Diagnosis not present

## 2014-01-29 DIAGNOSIS — F419 Anxiety disorder, unspecified: Secondary | ICD-10-CM | POA: Insufficient documentation

## 2014-01-29 DIAGNOSIS — M7542 Impingement syndrome of left shoulder: Secondary | ICD-10-CM | POA: Insufficient documentation

## 2014-01-29 DIAGNOSIS — S43402A Unspecified sprain of left shoulder joint, initial encounter: Secondary | ICD-10-CM | POA: Insufficient documentation

## 2014-01-29 DIAGNOSIS — M13812 Other specified arthritis, left shoulder: Secondary | ICD-10-CM | POA: Insufficient documentation

## 2014-01-29 DIAGNOSIS — F17210 Nicotine dependence, cigarettes, uncomplicated: Secondary | ICD-10-CM | POA: Insufficient documentation

## 2014-01-29 DIAGNOSIS — Z9889 Other specified postprocedural states: Secondary | ICD-10-CM

## 2014-01-29 DIAGNOSIS — G2581 Restless legs syndrome: Secondary | ICD-10-CM | POA: Diagnosis not present

## 2014-01-29 DIAGNOSIS — M19012 Primary osteoarthritis, left shoulder: Secondary | ICD-10-CM | POA: Diagnosis present

## 2014-01-29 HISTORY — DX: Primary osteoarthritis, unspecified shoulder: M19.019

## 2014-01-29 HISTORY — DX: Other specified postprocedural states: Z98.890

## 2014-01-29 HISTORY — DX: Impingement syndrome of left shoulder: M75.42

## 2014-01-29 HISTORY — DX: Other specified postprocedural states: Z85.820

## 2014-01-29 HISTORY — PX: SHOULDER ARTHROSCOPY WITH SUBACROMIAL DECOMPRESSION: SHX5684

## 2014-01-29 LAB — POCT I-STAT 4, (NA,K, GLUC, HGB,HCT)
Glucose, Bld: 95 mg/dL (ref 70–99)
HCT: 40 % (ref 36.0–46.0)
Hemoglobin: 13.6 g/dL (ref 12.0–15.0)
POTASSIUM: 3.5 mmol/L (ref 3.5–5.1)
Sodium: 140 mmol/L (ref 135–145)

## 2014-01-29 SURGERY — SHOULDER ARTHROSCOPY WITH SUBACROMIAL DECOMPRESSION
Anesthesia: Regional | Site: Shoulder | Laterality: Left

## 2014-01-29 MED ORDER — EPHEDRINE SULFATE 50 MG/ML IJ SOLN
INTRAMUSCULAR | Status: DC | PRN
  Administered 2014-01-29: 10 mg via INTRAVENOUS
  Administered 2014-01-29: 5 mg via INTRAVENOUS

## 2014-01-29 MED ORDER — PROPOFOL 10 MG/ML IV BOLUS
INTRAVENOUS | Status: DC | PRN
  Administered 2014-01-29: 120 mg via INTRAVENOUS

## 2014-01-29 MED ORDER — LACTATED RINGERS IV SOLN
INTRAVENOUS | Status: DC
  Administered 2014-01-29: 12:00:00 via INTRAVENOUS
  Filled 2014-01-29: qty 1000

## 2014-01-29 MED ORDER — FENTANYL CITRATE 0.05 MG/ML IJ SOLN
INTRAMUSCULAR | Status: AC
  Filled 2014-01-29: qty 4

## 2014-01-29 MED ORDER — MIDAZOLAM HCL 2 MG/2ML IJ SOLN
INTRAMUSCULAR | Status: AC
  Filled 2014-01-29: qty 2

## 2014-01-29 MED ORDER — PHENYLEPHRINE HCL 10 MG/ML IJ SOLN
10.0000 mg | INTRAVENOUS | Status: DC | PRN
  Administered 2014-01-29: 50 ug/min via INTRAVENOUS

## 2014-01-29 MED ORDER — ONDANSETRON HCL 4 MG/2ML IJ SOLN
INTRAMUSCULAR | Status: DC | PRN
  Administered 2014-01-29: 4 mg via INTRAVENOUS

## 2014-01-29 MED ORDER — FENTANYL CITRATE 0.05 MG/ML IJ SOLN
INTRAMUSCULAR | Status: AC
  Filled 2014-01-29: qty 2

## 2014-01-29 MED ORDER — CEPHALEXIN 500 MG PO CAPS: 500.0000 mg | ORAL_CAPSULE | Freq: Three times a day (TID) | ORAL | Status: DC

## 2014-01-29 MED ORDER — DEXAMETHASONE SODIUM PHOSPHATE 4 MG/ML IJ SOLN
INTRAMUSCULAR | Status: DC | PRN
  Administered 2014-01-29: 8 mg via INTRAVENOUS

## 2014-01-29 MED ORDER — FENTANYL CITRATE 0.05 MG/ML IJ SOLN
50.0000 ug | Freq: Once | INTRAMUSCULAR | Status: AC
Start: 2014-01-29 — End: 2014-01-29
  Administered 2014-01-29: 50 ug via INTRAVENOUS
  Filled 2014-01-29: qty 1

## 2014-01-29 MED ORDER — POVIDONE-IODINE 7.5 % EX SOLN
Freq: Once | CUTANEOUS | Status: DC
  Filled 2014-01-29: qty 118

## 2014-01-29 MED ORDER — CEFAZOLIN SODIUM-DEXTROSE 2-3 GM-% IV SOLR
2.0000 g | INTRAVENOUS | Status: AC
  Administered 2014-01-29: 2 g via INTRAVENOUS
  Filled 2014-01-29: qty 50

## 2014-01-29 MED ORDER — HYDROMORPHONE HCL 1 MG/ML IJ SOLN
0.2500 mg | INTRAMUSCULAR | Status: DC | PRN
  Filled 2014-01-29: qty 1

## 2014-01-29 MED ORDER — LIDOCAINE HCL (CARDIAC) 20 MG/ML IV SOLN
INTRAVENOUS | Status: DC | PRN
  Administered 2014-01-29: 80 mg via INTRAVENOUS

## 2014-01-29 MED ORDER — MEPERIDINE HCL 25 MG/ML IJ SOLN
6.2500 mg | INTRAMUSCULAR | Status: DC | PRN
  Filled 2014-01-29: qty 1

## 2014-01-29 MED ORDER — FENTANYL CITRATE 0.05 MG/ML IJ SOLN
INTRAMUSCULAR | Status: DC | PRN
  Administered 2014-01-29: 50 ug via INTRAVENOUS

## 2014-01-29 MED ORDER — LACTATED RINGERS IV SOLN
INTRAVENOUS | Status: DC | PRN
  Administered 2014-01-29 (×2): via INTRAVENOUS

## 2014-01-29 MED ORDER — PROMETHAZINE HCL 25 MG/ML IJ SOLN
6.2500 mg | INTRAMUSCULAR | Status: DC | PRN
  Filled 2014-01-29: qty 1

## 2014-01-29 MED ORDER — SUCCINYLCHOLINE CHLORIDE 20 MG/ML IJ SOLN
INTRAMUSCULAR | Status: DC | PRN
  Administered 2014-01-29: 100 mg via INTRAVENOUS

## 2014-01-29 MED ORDER — EPINEPHRINE HCL 1 MG/ML IJ SOLN
INTRAMUSCULAR | Status: DC | PRN
  Administered 2014-01-29: 2 mg via SUBCUTANEOUS

## 2014-01-29 MED ORDER — MIDAZOLAM HCL 2 MG/2ML IJ SOLN
2.0000 mg | Freq: Once | INTRAMUSCULAR | Status: AC
  Administered 2014-01-29: 2 mg via INTRAVENOUS
  Filled 2014-01-29: qty 2

## 2014-01-29 MED ORDER — SODIUM CHLORIDE 0.9 % IJ SOLN
INTRAMUSCULAR | Status: DC | PRN
  Administered 2014-01-29: 10 mL

## 2014-01-29 MED ORDER — CEFAZOLIN SODIUM-DEXTROSE 2-3 GM-% IV SOLR
INTRAVENOUS | Status: AC
  Filled 2014-01-29: qty 50

## 2014-01-29 MED ORDER — SODIUM CHLORIDE 0.9 % IR SOLN
Status: DC | PRN
  Administered 2014-01-29: 12000 mL

## 2014-01-29 MED ORDER — ROPIVACAINE HCL 5 MG/ML IJ SOLN
INTRAMUSCULAR | Status: DC | PRN
  Administered 2014-01-29: 30 mL via PERINEURAL

## 2014-01-29 MED ORDER — OXYCODONE-ACETAMINOPHEN 5-325 MG PO TABS: 1.0000 | ORAL_TABLET | ORAL | Status: DC | PRN

## 2014-01-29 SURGICAL SUPPLY — 82 items
BLADE CUDA 4.2 (BLADE) IMPLANT
BLADE CUDA GRT WHITE 3.5 (BLADE) ×2 IMPLANT
BLADE CUTTER GATOR 3.5 (BLADE) IMPLANT
BLADE GREAT WHITE 4.2 (BLADE) ×2 IMPLANT
BLADE SURG 11 STRL SS (BLADE) ×2 IMPLANT
BLADE SURG 15 STRL LF DISP TIS (BLADE) ×1 IMPLANT
BLADE SURG 15 STRL SS (BLADE) ×2
BUR 3.5 LG SPHERICAL (BURR) IMPLANT
BUR OVAL 6.0 (BURR) ×2 IMPLANT
BURR 3.5 LG SPHERICAL (BURR)
CANISTER SUCT LVC 12 LTR MEDI- (MISCELLANEOUS) ×5 IMPLANT
CANISTER SUCTION 2500CC (MISCELLANEOUS) IMPLANT
CANNULA 5.75X7 CRYSTAL CLEAR (CANNULA) ×2 IMPLANT
CANNULA 5.75X71 LONG (CANNULA) IMPLANT
CANNULA TWIST IN 8.25X7CM (CANNULA) ×4 IMPLANT
CLOTH BEACON ORANGE TIMEOUT ST (SAFETY) ×2 IMPLANT
COVER MAYO STAND STRL (DRAPES) ×2 IMPLANT
COVER TABLE BACK 60X90 (DRAPES) ×2 IMPLANT
DRAPE LG THREE QUARTER DISP (DRAPES) ×2 IMPLANT
DRAPE ORTHO SPLIT 77X108 STRL (DRAPES) ×4
DRAPE POUCH INSTRU U-SHP 10X18 (DRAPES) ×2 IMPLANT
DRAPE STERI 35X30 U-POUCH (DRAPES) ×2 IMPLANT
DRAPE SURG 17X23 STRL (DRAPES) ×2 IMPLANT
DRAPE SURG ORHT 6 SPLT 77X108 (DRAPES) ×2 IMPLANT
DRAPE U-SHAPE 47X51 STRL (DRAPES) ×2 IMPLANT
DRSG PAD ABDOMINAL 8X10 ST (GAUZE/BANDAGES/DRESSINGS) ×2 IMPLANT
DURAPREP 26ML APPLICATOR (WOUND CARE) ×2 IMPLANT
ELECT MENISCUS 165MM 90D (ELECTRODE) IMPLANT
ELECT REM PT RETURN 9FT ADLT (ELECTROSURGICAL) ×2
ELECTRODE REM PT RTRN 9FT ADLT (ELECTROSURGICAL) ×1 IMPLANT
FIBERSTICK 2 (SUTURE) IMPLANT
GAUZE XEROFORM 1X8 LF (GAUZE/BANDAGES/DRESSINGS) ×1 IMPLANT
GAUZE XEROFORM 5X9 LF (GAUZE/BANDAGES/DRESSINGS) ×1 IMPLANT
GLOVE BIO SURGEON STRL SZ7.5 (GLOVE) ×2 IMPLANT
GLOVE BIO SURGEON STRL SZ8 (GLOVE) ×4 IMPLANT
GLOVE INDICATOR 8.0 STRL GRN (GLOVE) ×4 IMPLANT
GOWN PREVENTION PLUS LG XLONG (DISPOSABLE) ×1 IMPLANT
GOWN STRL REIN XL XLG (GOWN DISPOSABLE) ×2 IMPLANT
IV NS IRRIG 3000ML ARTHROMATIC (IV SOLUTION) ×8 IMPLANT
KIT SHOULDER TRACTION (DRAPES) ×2 IMPLANT
LASSO SUT 90 DEGREE (SUTURE) IMPLANT
NDL 1/2 CIR CATGUT .05X1.09 (NEEDLE) IMPLANT
NDL SCORPION MULTI FIRE (NEEDLE) IMPLANT
NDL SPNL 18GX3.5 QUINCKE PK (NEEDLE) ×1 IMPLANT
NEEDLE 1/2 CIR CATGUT .05X1.09 (NEEDLE) IMPLANT
NEEDLE HYPO 22GX1.5 SAFETY (NEEDLE) ×2 IMPLANT
NEEDLE SCORPION MULTI FIRE (NEEDLE) IMPLANT
NEEDLE SPNL 18GX3.5 QUINCKE PK (NEEDLE) ×2 IMPLANT
NS IRRIG 500ML POUR BTL (IV SOLUTION) IMPLANT
PACK BASIN DAY SURGERY FS (CUSTOM PROCEDURE TRAY) ×2 IMPLANT
PAD ABD 8X10 STRL (GAUZE/BANDAGES/DRESSINGS) ×4 IMPLANT
PENCIL BUTTON HOLSTER BLD 10FT (ELECTRODE) IMPLANT
SET ARTHROSCOPY TUBING (MISCELLANEOUS) ×2
SET ARTHROSCOPY TUBING PVC (MISCELLANEOUS) ×1 IMPLANT
SLING ULTRA II AB L (ORTHOPEDIC SUPPLIES) IMPLANT
SLING ULTRA II AB S (ORTHOPEDIC SUPPLIES) IMPLANT
SPONGE GAUZE 4X4 12PLY (GAUZE/BANDAGES/DRESSINGS) ×2 IMPLANT
SPONGE GAUZE 4X4 12PLY STER LF (GAUZE/BANDAGES/DRESSINGS) ×1 IMPLANT
SPONGE LAP 4X18 X RAY DECT (DISPOSABLE) IMPLANT
SUCTION FRAZIER TIP 10 FR DISP (SUCTIONS) IMPLANT
SUT 2 FIBERLOOP 20 STRT BLUE (SUTURE)
SUT ETHILON 3 0 PS 1 (SUTURE) ×2 IMPLANT
SUT FIBERWIRE #2 38 T-5 BLUE (SUTURE)
SUT LASSO 45 DEGREE LEFT (SUTURE) IMPLANT
SUT LASSO 45D RIGHT (SUTURE) IMPLANT
SUT PDS AB 0 CT1 27 (SUTURE) IMPLANT
SUT TIGER TAPE 7 IN WHITE (SUTURE) IMPLANT
SUT VIC AB 0 CT1 36 (SUTURE) IMPLANT
SUT VIC AB 2-0 CT1 27 (SUTURE)
SUT VIC AB 2-0 CT1 TAPERPNT 27 (SUTURE) IMPLANT
SUTURE 2 FIBERLOOP 20 STRT BLU (SUTURE) IMPLANT
SUTURE FIBERWR #2 38 T-5 BLUE (SUTURE) IMPLANT
SYR 20CC LL (SYRINGE) ×2 IMPLANT
SYR CONTROL 10ML LL (SYRINGE) IMPLANT
SYR TB 1ML 27GX1/2 SAFE (SYRINGE) ×1 IMPLANT
SYR TB 1ML 27GX1/2 SAFETY (SYRINGE) ×2
TAPE CLOTH SURG 6X10 WHT LF (GAUZE/BANDAGES/DRESSINGS) ×1 IMPLANT
TOWEL OR 17X24 6PK STRL BLUE (TOWEL DISPOSABLE) ×4 IMPLANT
TUBE CONNECTING 12X1/4 (SUCTIONS) ×4 IMPLANT
WAND 90 DEG TURBOVAC W/CORD (SURGICAL WAND) ×2 IMPLANT
WATER STERILE IRR 500ML POUR (IV SOLUTION) ×2 IMPLANT
YANKAUER SUCT BULB TIP NO VENT (SUCTIONS) IMPLANT

## 2014-01-29 NOTE — Interval H&P Note (Signed)
History and Physical Interval Note:  01/29/2014 2:20 PM  [name]  has presented today for surgery, with the diagnosis of left shoulder AC Osteoarthritis, Rotator Cuff Instability  The various methods of treatment have been discussed with the patient and family. After consideration of risks, benefits and other options for treatment, the patient has consented to  Procedure(s): LEFT SHOULDER ARTHROSCOPY WITH SUBACROMIAL DECOMPRESSION/DISTAL CLAVICLE RESECTION AND DEBRIDEMENT (Left) as a surgical intervention .  The patient's history has been reviewed, patient examined, no change in status, stable for surgery.  I have reviewed the patient's chart and labs.  Questions were answered to the patient's satisfaction.     Laura [name]

## 2014-01-29 NOTE — Anesthesia Preprocedure Evaluation (Signed)
Anesthesia Evaluation  Patient identified by MRN, date of birth, ID band Patient awake    Reviewed: Allergy & Precautions, H&P , NPO status , Patient's Chart, lab work & pertinent test results  Airway Mallampati: II  TM Distance: >3 FB Neck ROM: Full    Dental no notable dental hx.    Pulmonary neg pulmonary ROS, Current Smoker,  breath sounds clear to auscultation  Pulmonary exam normal       Cardiovascular hypertension, Pt. on medications Rhythm:Regular Rate:Normal     Neuro/Psych Anxiety Unspecified hereditary and idiopathic peripheral neuropathy.  Neuromuscular disease    GI/Hepatic hiatal hernia, GERD-  Medicated,(+) Hepatitis -, BHep B antibody positive.   Endo/Other  negative endocrine ROS  Renal/GU negative Renal ROS     Musculoskeletal  (+) Arthritis -,   Abdominal   Peds  Hematology negative hematology ROS (+)   Anesthesia Other Findings   Reproductive/Obstetrics negative OB ROS                             Anesthesia Physical  Anesthesia Plan  ASA: II  Anesthesia Plan: General and Regional   Post-op Pain Management:    Induction: Intravenous  Airway Management Planned: Oral ETT  Additional Equipment:   Intra-op Plan:   Post-operative Plan: Extubation in OR  Informed Consent: I have reviewed the patients History and Physical, chart, labs and discussed the procedure including the risks, benefits and alternatives for the proposed anesthesia with the patient or authorized representative who has indicated his/her understanding and acceptance.   Dental advisory given  Plan Discussed with: CRNA  Anesthesia Plan Comments:         Anesthesia Quick Evaluation

## 2014-01-29 NOTE — Transfer of Care (Signed)
Immediate Anesthesia Transfer of Care Note  Patient: Laura [name]  Procedure(s) Performed: Procedure(s): LEFT SHOULDER ARTHROSCOPY WITH SUBACROMIAL DECOMPRESSION/DISTAL CLAVICLE RESECTION AND DEBRIDEMENT (Left)  Patient Location: PACU  Anesthesia Type:General  Level of Consciousness: awake, alert , oriented and patient cooperative  Airway & Oxygen Therapy: Patient Spontanous Breathing and Patient connected to nasal cannula oxygen  Post-op Assessment: Report given to PACU RN and Post -op Vital signs reviewed and stable  Post vital signs: Reviewed and stable  Complications: No apparent anesthesia complications

## 2014-01-29 NOTE — H&P (Signed)
Laura [name] is an 58 y.o. female.   Chief Complaint: Left shoulder discomfort fir months HPI: Patient presents with joint discomfort that had been persistent for several months now. Despite conservative treatments, her discomfort has not improved. Imaging was obtained. Other conservative and surgical treatments were discussed in detail. Patient wishes to proceed with surgery as consented. Denies SOB, CP, or calf pain. No Fever, chills, or nausea/ vomiting.   Past Medical History  Diagnosis Date  . Anxiety   . OA (osteoarthritis) of knee     RIGHT  . Unspecified hereditary and idiopathic peripheral neuropathy   . Hypertension   . RLS (restless legs syndrome)   . GERD (gastroesophageal reflux disease)   . H/O hiatal hernia   . Hepatitis B antibody positive   . IBS (irritable bowel syndrome)   . Rotator cuff impingement syndrome of left shoulder   . Acromioclavicular joint arthritis     left shoulder  . History of melanoma excision     01-05-2014    Past Surgical History  Procedure Laterality Date  . Carpal tunnel release Right 1995  . Lumbar disc surgery  08-22-2003    LEFT  L5  --  S1  . Cholecystectomy  1996  . Carpal tunnel release Right 1990's  . Total abdominal hysterectomy w/ bilateral salpingoophorectomy  1999  . Anterior cervical decomp/discectomy fusion  2000    C4 -- C5  . Knee arthroscopy with medial menisectomy Right 05/25/2013    Procedure: RIGHT KNEE ARTHROSCOPY WITH PARTIAL LATERAL and MEDIAL MENISECTOMY AND CHONDROPLASTY;  Surgeon: Sydnee Cabal, MD;  Location: [facility];  Service: Orthopedics;  Laterality: Right;  . Mohs surgery  01-05-2014    LEFT SIDE OF FACE    Family History  Problem Relation Age of Onset  . Cancer Mother   . HIV/AIDS Father    Social History:  reports that she has been smoking Cigarettes.  She has a 15 pack-year smoking history. She has never used smokeless tobacco. She reports that she drinks alcohol. She reports  that she does not use illicit drugs.  Allergies:  Allergies  Allergen Reactions  . Celebrex [Celecoxib] Hives    Medications Prior to Admission  Medication Sig Dispense Refill  . citalopram (CELEXA) 20 MG tablet Take 20 mg by mouth daily.   0  . diazepam (VALIUM) 5 MG tablet Take 1 tablet by mouth every 8 (eight) hours as needed.     Marland Kitchen estradiol (ESTRACE) 0.1 MG/GM vaginal cream Place 1 Applicatorful vaginally daily.    Marland Kitchen gabapentin (NEURONTIN) 300 MG capsule Take 1 capsule (300 mg total) by mouth 3 (three) times daily. 90 capsule 6  . hydrochlorothiazide (MICROZIDE) 12.5 MG capsule Take 12.5 mg by mouth every morning.     Marland Kitchen HYDROcodone-acetaminophen (NORCO) 10-325 MG per tablet Take 1 tablet by mouth every 6 (six) hours as needed.    Marland Kitchen losartan (COZAAR) 100 MG tablet Take 100 mg by mouth every morning.     . mirtazapine (REMERON) 15 MG tablet Take 15 mg by mouth at bedtime as needed.   0  . Multiple Vitamin (MULTIVITAMIN) tablet Take 1 tablet by mouth daily.    . Omega-3 Fatty Acids (FISH OIL) 1000 MG CAPS Take 2 capsules by mouth daily.    . pantoprazole (PROTONIX) 40 MG tablet Take 40 mg by mouth 2 (two) times daily.     . SEROQUEL XR 400 MG 24 hr tablet Take 400 mg by mouth at bedtime.  0  . VESICARE 5 MG tablet Take 2.5 mg by mouth every morning. 1/2 tablet daily  10  . dicyclomine (BENTYL) 20 MG tablet Take 20 mg by mouth 3 (three) times daily as needed.       Results for orders placed or performed during the hospital encounter of 01/29/14 (from the past 48 hour(s))  I-STAT 4, (NA,K, GLUC, HGB,HCT)     Status: None   Collection Time: 01/29/14 11:53 AM  Result Value Ref Range   Sodium 140 135 - 145 mmol/L   Potassium 3.5 3.5 - 5.1 mmol/L   Glucose, Bld 95 70 - 99 mg/dL   HCT 40.0 36.0 - 46.0 %   Hemoglobin 13.6 12.0 - 15.0 g/dL   No results found.  Review of Systems  Constitutional: Negative.   Eyes: Negative.   Respiratory: Negative.   Cardiovascular: Negative.    Gastrointestinal: Negative.   Genitourinary: Negative.   Musculoskeletal: Positive for joint pain (Left shoulder) and neck pain.  Skin: Negative.   Neurological: Negative.   Endo/Heme/Allergies: Negative.   Psychiatric/Behavioral: Negative.     Blood pressure 134/75, pulse 83, temperature 97.7 F (36.5 C), temperature source Oral, resp. rate 16, height 5\' 2"  (1.575 m), weight 71.895 kg (158 lb 8 oz), SpO2 98 %. Physical Exam  Constitutional: She is oriented to person, place, and time. She appears well-developed.  HENT:  Head: Normocephalic.  Eyes: EOM are normal.  Neck: Normal range of motion.  Cardiovascular: Normal rate, normal heart sounds and intact distal pulses.   Respiratory: Effort normal and breath sounds normal.  GI: Bowel sounds are normal.  Genitourinary:  Deferred  Musculoskeletal:  Left shoulder pain with ROM  Neurological: She is alert and oriented to person, place, and time.  Skin: Skin is warm and dry.  Psychiatric: Her behavior is normal.     Assessment/Plan Left shoulder impingement and ACOA: Left shoulder scope today as consented D/c home today Follow instructions F/u in office in 7 days  Zigmond Trela L 01/29/2014, 1:33 PM

## 2014-01-29 NOTE — Op Note (Signed)
Dictated [phone]

## 2014-01-29 NOTE — Anesthesia Procedure Notes (Addendum)
Anesthesia Regional Block:  Interscalene brachial plexus block  Pre-Anesthetic Checklist: ,, timeout performed, Correct Patient, Correct Site, Correct Laterality, Correct Procedure, Correct Position, site marked, Risks and benefits discussed,  Surgical consent,  Pre-op evaluation,  At surgeon's request and post-op pain management  Laterality: Left  Prep: chloraprep       Needles:  Injection technique: Single-shot  Needle Type: Stimulator Needle - 40      Needle Gauge: 22 and 22 G    Additional Needles:  Procedures: ultrasound guided (picture in chart) and nerve stimulator Interscalene brachial plexus block  Nerve Stimulator or Paresthesia:  Response: Deltoid, 0.6 mA,   Additional Responses:   Narrative:  Injection made incrementally with aspirations every 5 mL.  Performed by: Personally  Anesthesiologist: Nolon Nations R  Additional Notes: Based on initial block results, pt with only partial perineural spread. Majority given outside of the nerve sheath. Additional 24m given. Good perineural spread.    Procedure Name: Intubation Date/Time: 01/29/2014 2:36 PM Performed by: SWanita ChamberlainPre-anesthesia Checklist: Patient identified, Timeout performed, Suction available, Patient being monitored and Emergency Drugs available Patient Re-evaluated:Patient Re-evaluated prior to inductionOxygen Delivery Method: Circle system utilized Preoxygenation: Pre-oxygenation with 100% oxygen Intubation Type: IV induction Ventilation: Mask ventilation without difficulty Laryngoscope Size: Mac and 3 Grade View: Grade II Tube type: Oral Tube size: 7.0 mm Number of attempts: 1 Airway Equipment and Method: LTA kit utilized and Stylet Placement Confirmation: ETT inserted through vocal cords under direct vision,  positive ETCO2 and breath sounds checked- equal and bilateral Secured at: 21 cm Tube secured with: Tape Dental Injury: Teeth and Oropharynx as per pre-operative assessment

## 2014-01-29 NOTE — Anesthesia Postprocedure Evaluation (Signed)
  Anesthesia Post-op Note  Patient: Laura [name]  Procedure(s) Performed: Procedure(s) (LRB): LEFT SHOULDER ARTHROSCOPY WITH SUBACROMIAL DECOMPRESSION/DISTAL CLAVICLE RESECTION AND DEBRIDEMENT (Left)  Patient Location: PACU  Anesthesia Type: General with post op pain block  Level of Consciousness: awake and alert   Airway and Oxygen Therapy: Patient Spontanous Breathing  Post-op Pain: mild  Post-op Assessment: Post-op Vital signs reviewed, Patient's Cardiovascular Status Stable, Respiratory Function Stable, Patent Airway and No signs of Nausea or vomiting  Last Vitals:  Filed Vitals:   01/29/14 1620  BP:   Pulse: 90  Temp:   Resp: 22    Post-op Vital Signs: stable   Complications: No apparent anesthesia complications

## 2014-01-29 NOTE — Discharge Instructions (Signed)
°  Post Anesthesia Home Care Instructions ° °Activity: °Get plenty of rest for the remainder of the day. A responsible adult should stay with you for 24 hours following the procedure.  °For the next 24 hours, DO NOT: °-Drive a car °-Operate machinery °-Drink alcoholic beverages °-Take any medication unless instructed by your physician °-Make any legal decisions or sign important papers. ° °Meals: °Start with liquid foods such as gelatin or soup. Progress to regular foods as tolerated. Avoid greasy, spicy, heavy foods. If nausea and/or vomiting occur, drink only clear liquids until the nausea and/or vomiting subsides. Call your physician if vomiting continues. ° °Special Instructions/Symptoms: °Your throat may feel dry or sore from the anesthesia or the breathing tube placed in your throat during surgery. If this causes discomfort, gargle with warm salt water. The discomfort should disappear within 24 hours. ° °Regional Anesthesia Blocks ° °1. Numbness or the inability to move the "blocked" extremity may last from 3-48 hours after placement. The length of time depends on the medication injected and your individual response to the medication. If the numbness is not going away after 48 hours, call your surgeon. ° °2. The extremity that is blocked will need to be protected until the numbness is gone and the  Strength has returned. Because you cannot feel it, you will need to take extra care to avoid injury. Because it may be weak, you may have difficulty moving it or using it. You may not know what position it is in without looking at it while the block is in effect. ° °3. For blocks in the legs and feet, returning to weight bearing and walking needs to be done carefully. You will need to wait until the numbness is entirely gone and the strength has returned. You should be able to move your leg and foot normally before you try and bear weight or walk. You will need someone to be with you when you first try to ensure you  do not fall and possibly risk injury. ° °4. Bruising and tenderness at the needle site are common side effects and will resolve in a few days. ° °5. Persistent numbness or new problems with movement should be communicated to the surgeon or the [facility] ([phone])/ [facility] ([phone]). °

## 2014-01-29 NOTE — H&P (View-Only) (Signed)
NPO AFTER MN WITH EXCEPTION CLEAR LIQUIDS UNTIL 0730 (NO CREAM/ MILK PRODUCTS). ARRIVE AT 1130. NEEDS ISTAT. CURRENT EKG IN CHART AND EPIC. WILL TAKE AM MEDS WITH EXCEPTION NO HCTZ W/ SIPS OF WATER.

## 2014-01-30 ENCOUNTER — Encounter (HOSPITAL_BASED_OUTPATIENT_CLINIC_OR_DEPARTMENT_OTHER): Payer: Self-pay | Admitting: Specialist

## 2014-01-30 NOTE — Op Note (Signed)
[name], [name]                ACCOUNT NO.:  [id number]  MEDICAL RECORD NO.:  [id number]  LOCATION:                                 FACILITY:  PHYSICIAN:  Cynda Familia, M.D.DATE OF BIRTH:  Apr 06, 1955  DATE OF PROCEDURE:  01/29/2014 DATE OF DISCHARGE:  01/29/2014                              OPERATIVE REPORT   PREOPERATIVE DIAGNOSES:  Left shoulder impingement syndrome, acromioclavicular arthritis, possible labral tear.  POSTOPERATIVE DIAGNOSES: 1. Left shoulder type 1 labral tearing. 2. Chronic impingement syndrome. 3. Symptomatic acromioclavicular joint arthritis.  PROCEDURES: 1. Left shoulder glenohumeral arthroscopy with labral debridement. 2. Arthroscopic subacromial decompression with acromioplasty,     bursectomy, and coracoacromial ligament release. 3. Arthroscopic distal clavicle resection, Mumford procedure.  SURGEON:  Cynda Familia, M.D.  ASSISTANT:  Wyatt Portela, PA-C.  ANESTHESIA:  Interscalene block, general.  ESTIMATED BLOOD LOSS:  Minimal.  DRAINS:  None.  COMPLICATIONS:  None.  DISPOSITION:  PACU, stable.  OPERATIVE DETAILS:  The patient and family were counseled in the holding area.  Correct site was identified.  IV started sedation given, block was administered.  On the way to the operating room, IV Ancef was given. Placed in supine position under general anesthesia.  She was then turned to a right lateral decubitus position, properly padded and bumped.  Left shoulder examined, full range of motion and stable.  Prepped with DuraPrep and draped into a sterile fashion.  Overhead shoulder positioner was utilized at 30 degrees abduction, 10 degrees of forward flexion, and 10 pounds of longitudinal traction.  Time-out was done, again confirmed the left side.  Posterior portal was created. Arthroscope was placed to the glenohumeral joint.  Glenohumeral joint arthroscopy revealed intact articular cartilage, stability  glenohumeral ligaments, synovium capsule, and rotator cuff.  The labrum showed type 1 tearing from 9 o'clock to 3 o'clock, but the biceps labral anchor was stable.  Anterior portal was made from outside in technique.  Motorized shaver was induced. This was then shaven back to nice smooth tissue with cautery system for a nice smooth edge.  Subacromial region with thick subacromial bursa, a lateral portal was established.  Neurovascular structures were protected including axillary nerve and the subacromial bursa was resected.  CA ligament was released with cautery.  Trudee [name] was then placed posteriorly and anterior-inferior and lateral acromioplasty was performed, converting to a flat acromial morphology.  AC joint found to be markedly osteoarthritic with severely thickened capsule and marked synovitis and erosion of the articular cartilage and subchondral sclerosis and bone and bone cyst.  Trudee [name] was then placed in the anterolateral, 5-8 mm of the distal clavicle was resected with the superior capsule intact.  The clavicle was palpated, found to be stable. Debris was removed.  Hemostasis obtained.  Arthroscopy was removed and taken out of traction.  Normal pulses at the end of the case.  Portals were closed with 4-0 nylon suture.  A 10 mL of Sensorcaine placed in skin, 10 mL in subacromial region.  Sterile dressing applied.  Turned supine, awakened, and taken from the operating room to the PACU in stable condition.  She will be stabilized in PACU and discharged home.  To  help the patient's positioning, prepping, draping, technical and surgical assistance throughout entire case, wound closure, application of dressing and sling, Mr. Wyatt Portela, PA-C's assistance was needed throughout the entire case.          ______________________________ Cynda Familia, M.D.     RAC/MEDQ  D:  01/29/2014  T:  01/29/2014  Job:  [id number]

## 2014-02-22 ENCOUNTER — Other Ambulatory Visit: Payer: Self-pay | Admitting: Neurology

## 2014-06-14 ENCOUNTER — Ambulatory Visit (INDEPENDENT_AMBULATORY_CARE_PROVIDER_SITE_OTHER): Payer: BLUE CROSS/BLUE SHIELD | Admitting: Neurology

## 2014-06-14 ENCOUNTER — Encounter: Payer: Self-pay | Admitting: Neurology

## 2014-06-14 VITALS — BP 142/90 | HR 88 | Resp 18 | Ht 62.0 in | Wt 160.0 lb

## 2014-06-14 DIAGNOSIS — G629 Polyneuropathy, unspecified: Secondary | ICD-10-CM

## 2014-06-14 DIAGNOSIS — M79671 Pain in right foot: Secondary | ICD-10-CM | POA: Diagnosis not present

## 2014-06-14 DIAGNOSIS — M79672 Pain in left foot: Secondary | ICD-10-CM | POA: Diagnosis not present

## 2014-06-14 DIAGNOSIS — Z9889 Other specified postprocedural states: Secondary | ICD-10-CM

## 2014-06-14 MED ORDER — GABAPENTIN 300 MG PO CAPS: ORAL_CAPSULE | ORAL | Status: DC

## 2014-06-14 NOTE — Progress Notes (Signed)
Subjective:    Patient ID: Laura [name] is a 59 y.o. female.  HPI     Interim history:  Laura [name] is a very pleasant 59 year-old right-handed woman with an underlying medical history of hypertension, reflux disease, insomnia, and peripheral neuropathy who presents for followup consultation of her neuropathy and complaint of foot pain, and concern for restless leg syndrome. She is unaccompanied today. I last saw her on 12/14/2013, at which time she reported taking Seroquel long-acting 400 mg each night. She was more groggy during the day. She did not wake up rested. She had gained some weight. She fell some 3 months prior and scraped her right leg. She was reporting some memory issues. He felt sluggish and forgetful. She had left shoulder pain. She had an MRI of the left shoulder through her orthopedic doctor. She had mild arthritic changes and she stated she may need arthroscopic surgery to the left shoulder. I suggested she reduce gabapentin from 300 mg 3 times a day to twice daily because of sedation reported.  Today, 06/14/2014: She reports weight gain, she reports swelling in her ankles. She saw her primary care physician a couple of months ago for her physical. She is taking hydrochlorothiazide. She has some pain in the dorsum of her left hand at times. No redness or rash or swelling is noted. She is wondering if this is part of neuropathy. She has come along well after her left shoulder surgery. She feels that her foot burning is unchanged. She has no additional new symptoms. They have adopted their granddaughter a while ago. She is 29 years old now. She is still coping with the fact that her son is in prison. He will be in prison for 10 years she states. Her Seroquel was reduced to 300 mg because of sedation. She sees psychiatry regularly for her mood disorder and to help cope.   Previously:  I saw her on 06/12/2013, at which time she reported significant stressors. She reported some neck  stiffness and left-sided sciatica. Of note, she had neck surgery under Dr. Vertell Limber in 1998 and lower back surgery in 2001. She reported recurrent UTIs. She was going to see a urologist. She was on gabapentin 100 mg twice daily and 200 mg at night. She was in outpatient physical therapy. She was on antibiotics and prednisone for upper respiratory infection. She reported some residual restless leg symptoms. I suggested a second opinion with Dr. Krista Blue. she saw Dr. Krista Blue on 06/19/2013. She ordered a cervical spine MRI which was done on 06/29/2013: Abnormal MR scan of the cervical spine showing postoperative changes with a cervical fusion at C5-C6 with broad-based disc protrusion at C6-7 resulting in mild canal narrowing but without significant root impingement. Dr. Krista Blue also suggested a skin biopsy, this was normal. Dr. Krista Blue increased her gabapentin to 300 mg tid.    I saw her on 10/24/2012, at which time I did not suggest any medication changes but added more blood work. We did some PT. We talked about her prior blood work and an electrophysiological test results in detail last time. I added CK, HTLV antibodies, vitamin B6, heavy metal screen in blood/urine. All of these were fine. We called her with the test results. In the interim, she had right knee arthroscopic surgery on 05/26/2013 for meniscus tears.  I first met her on 07/27/2012, at which time she was on Valium, hydrocodone, gabapentin, Protonix, prednisone 5 mg daily, Ambien as needed, Norvasc, Wellbutrin, Fosamax, Flexeril, magnesium oxide. 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 Her half sister was diagnosed with neuropathy and the patient saw a podiatrist, who did X-rays and told her she had plantar fasciitis. She c/o paresthesias, cramping, burning sensation, which started in her soles and advanced to her foot and ankle areas bilaterally. She also endorsed some RLS symptoms. She has been given a cream by her foot doctor and when she started gabapentin, she  had blisters in her mouth, which resolved. She had one syncopal event at work after starting gabapentin for a few days and after a week she fell at home, bumping her head without LOC reported. She had no further issues like this. She had had dizziness and lightheadedness and pre-syncopal spells even before she started the gabapentin. She had been taking the hydrocodone sparingly as she raises her 60 yo GD and knows the narcotic sedates her. She endorsed significant stressors, including losing her daughter to MI some 2 years ago and her GD's father (pt's son) has had issues with drugs, she reported.   She works full-time at a [employer] and her job involves Media planner. She tries not to take hydrocodone or Valium at work. She does complaining of considerable pain during the day and the work week has been exceedingly difficult for her. She has 8 or 10 hour days. She was placed on FMLA by her PCP. She was wondering whether her significant stressors were contributing to her problem.   At the time of her visit in 6/14 I suggested further blood work as well as EMG and nerve conduction studies. She had the EMG and nerve conduction test on 08/24/2012: normal nerve conduction, normal EMG. There was no evidence of peripheral neuropathy, however, small fiber neuropathy may be missed by a standard nerve conduction test.   Her blood work was unremarkable and I we called her on 08/02/2012 with the results. She had normal vitamin D, normal B12, normal CBC, normal CMP and the only thing that came back mildly abnormal was a borderline low vitamin D and she was advised to start an over-the-counter vitamin D supplement. She had mildly elevated platelets which was nonspecific.    Her Past Medical History Is Significant For: Past Medical History  Diagnosis Date  . Anxiety   . OA (osteoarthritis) of knee     RIGHT  . Unspecified hereditary and idiopathic peripheral neuropathy   . Hypertension   . RLS  (restless legs syndrome)   . GERD (gastroesophageal reflux disease)   . H/O hiatal hernia   . Hepatitis B antibody positive   . IBS (irritable bowel syndrome)   . Rotator cuff impingement syndrome of left shoulder   . Acromioclavicular joint arthritis     left shoulder  . History of melanoma excision     01-05-2014  . Cancer     skin (facial)     Her Past Surgical History Is Significant For: Past Surgical History  Procedure Laterality Date  . Carpal tunnel release Right 1995  . Lumbar disc surgery  08-22-2003    LEFT  L5  --  S1  . Cholecystectomy  1996  . Carpal tunnel release Right 1990's  . Total abdominal hysterectomy w/ bilateral salpingoophorectomy  1999  . Anterior cervical decomp/discectomy fusion  2000    C4 -- C5  . Knee arthroscopy with medial menisectomy Right 05/25/2013    Procedure: RIGHT KNEE ARTHROSCOPY WITH PARTIAL LATERAL and MEDIAL MENISECTOMY AND CHONDROPLASTY;  Surgeon: Sydnee Cabal, MD;  Location: [facility];  Service: Orthopedics;  Laterality: Right;  . Mohs surgery  01-05-2014    LEFT SIDE OF FACE  . Shoulder arthroscopy with subacromial decompression Left 01/29/2014    Procedure: LEFT SHOULDER ARTHROSCOPY WITH SUBACROMIAL DECOMPRESSION/DISTAL CLAVICLE RESECTION AND DEBRIDEMENT;  Surgeon: Sydnee Cabal, MD;  Location[facility];  Service: Orthopedics;  Laterality: Left;    Her Family History Is Significant For: Family History  Problem Relation Age of Onset  . Cancer Mother   . HIV/AIDS Father     Her Social History Is Significant For: History   Social History  . Marital Status: Married    Spouse Name: [name]  . Number of Children: 3  . Years of Education: GED   Occupational History  .      not employed   Social History Main Topics  . Smoking status: Current Every Day Smoker -- 0.75 packs/day for 20 years    Types: Cigarettes  . Smokeless tobacco: Never Used  . Alcohol Use: 0.0 oz/week    0 Standard  drinks or equivalent per week     Comment: VERY RARE  . Drug Use: No  . Sexual Activity: Not on file   Other Topics Concern  . None   Social History Narrative   Patient lives at home with her husband [name]).    Patient is not working as time but she trying for disability.   Education 10th grade   Caffeine daily coffee and mountain dew . One can daily.   Marland Kitchen          Her Allergies Are:  Allergies  Allergen Reactions  . Celebrex [Celecoxib] Hives  :   Her Current Medications Are:  Outpatient Encounter Prescriptions as of 06/14/2014  Medication Sig  . cholecalciferol (VITAMIN D) 1000 UNITS tablet Take 1,000 Units by mouth daily.  . citalopram (CELEXA) 20 MG tablet Take 20 mg by mouth daily.   . diazepam (VALIUM) 5 MG tablet Take 1 tablet by mouth every 8 (eight) hours as needed.   . dicyclomine (BENTYL) 20 MG tablet Take 20 mg by mouth 3 (three) times daily as needed.   Marland Kitchen estradiol (ESTRACE) 0.1 MG/GM vaginal cream Place 1 Applicatorful vaginally daily.  Marland Kitchen gabapentin (NEURONTIN) 300 MG capsule One capsule two to three times daily as directed  . Ginkgo Biloba 40 MG TABS Take by mouth.  . hydrochlorothiazide (MICROZIDE) 12.5 MG capsule Take 12.5 mg by mouth every morning.   Marland Kitchen HYDROcodone-acetaminophen (NORCO) 10-325 MG per tablet Take 1 tablet by mouth.  . losartan (COZAAR) 100 MG tablet Take 100 mg by mouth every morning.   . mirtazapine (REMERON) 15 MG tablet Take 15 mg by mouth at bedtime as needed.   . Multiple Vitamin (MULTIVITAMIN) tablet Take 1 tablet by mouth daily.  . Omega-3 Fatty Acids (FISH OIL) 1000 MG CAPS Take 2 capsules by mouth daily.  . pantoprazole (PROTONIX) 40 MG tablet Take 40 mg by mouth 2 (two) times daily.   . SEROQUEL XR 400 MG 24 hr tablet Take 300 mg by mouth at bedtime.   . VESICARE 5 MG tablet Take 2.5 mg by mouth every morning. 1/2 tablet daily  . vitamin E 400 UNIT capsule Take 400 Units by mouth daily.  . [DISCONTINUED] cephALEXin (KEFLEX) 500  MG capsule Take 1 capsule (500 mg total) by mouth 3 (three) times daily.  . [DISCONTINUED] oxyCODONE-acetaminophen (ROXICET) 5-325 MG per tablet Take 1-2 tablets by mouth every 4 (four) hours as needed.  No facility-administered encounter medications on file as of 06/14/2014.  :  Review of Systems:  Out of a complete 14 point review of systems, all are reviewed and negative with the exception of these symptoms as listed below:   Review of Systems  Cardiovascular:       Swelling in both feet   Neurological:       Decreased balance, patient feels like she is dropping things more, Pain on top side of L hand     Objective:  Neurologic Exam  Physical Exam Physical Examination:   Filed Vitals:   06/14/14 1114  BP: 142/90  Pulse: 88  Resp: 18    General Examination: The patient is a very pleasant 59 y.o. female in no acute distress. She appears well-developed and well-nourished and adequately groomed. She is mildly anxious appearing and does come close to tears at one time.  HEENT: Normocephalic, atraumatic, pupils are equal, round and reactive to light and accommodation. Extraocular tracking is good without limitation to gaze excursion or nystagmus noted. Normal smooth pursuit is noted. Funduscopy is normal. Hearing is grossly intact. Face is symmetric with normal facial animation and normal facial sensation. Speech is clear with no dysarthria noted. There is no hypophonia. There is no lip, neck/head, jaw or voice tremor. Neck is supple with full range of passive and active motion. There are no carotid bruits on auscultation. Oropharynx exam reveals: moderate mouth dryness, adequate dental hygiene and no airway crowding. Mallampati is class II. Tongue protrudes centrally and palate elevates symmetrically.    Chest: Clear to auscultation without wheezing, rhonchi or crackles noted.  Heart: S1+S2+0, regular and normal without murmurs, rubs or gallops noted.   Abdomen: Soft, non-tender  and non-distended with normal bowel sounds appreciated on auscultation.  Extremities: There is trace pitting edema in the ankles bilaterally, right a little bit more than left. She has healing scars from the knee surgery with minimal swelling and some soreness. Pedal pulses are intact.   Skin: Warm and dry without trophic changes noted. There are no varicose veins, except for a few prominent appearing veins.  Musculoskeletal: exam reveals no obvious joint deformities, tenderness or joint swelling or erythema, except for mild arthritic changes in both hands and  well-healed scars from her left shoulder surgery. She has no abnormality palpable or visible in the left dorsum of the hand.     Neurologically:  Mental status: The patient is awake, alert and oriented in all 4 spheres. Her memory, attention, language and knowledge are appropriate. There is no aphasia, agnosia, apraxia or anomia. Speech is clear with normal prosody and enunciation. Thought process is linear. Mood is congruent and affect is flat.  Cranial nerves are as described above under HEENT exam. In addition, shoulder shrug is normal with equal shoulder height noted. Motor exam: Normal bulk, strength and tone is noted. There is no drift, tremor or rebound. Romberg is negative. Reflexes are 2+ throughout, including ankles. Toes are downgoing bilaterally. Fine motor skills are intact with normal finger taps, normal hand movements, normal rapid alternating patting, normal foot taps and normal foot agility.  Cerebellar testing shows no dysmetria or intention tremor on finger to nose testing. Heel to shin is unremarkable bilaterally. There is no truncal or gait ataxia.  Sensory exam is intact to light touch, pinprick, vibration, temperature sense in the upper and lower extremities, with the exception of mild decrease in pinprick sensation, and vibration sense in her feet bilaterally, R and little more than L,  stable from last time. Gait, station  and balance: she stands up slowly. She walks slowly and has a slight limp on the right, unchanged. She turns slowly. She walks with her cane.   Assessment and Plan:   In summary, Laura [name] is a very pleasant 59 year old female with an underlying medical history of hypertension, reflux disease, mood disorder, insomnia, s/p R knee arthroscopic surgery and L shoulder arthroscopic surgery on 01/29/2014, who has a history of burning foot pain and tingling as well as some restless leg symptoms, with the latter not being a big player. Her neurological exam is stable. She has a family history of PN. She has been on gabapentin for symptomatic relief, but has had some sedation for which I asked her to reduce it to bid, but she was using 2 pills at night and 1 in the morning. In addition, she felt sedated from Seroquel, which has been lowered. She is advised to reduce gabapentin to 300 mg bid. Workup for neuropathy was unremarkable thus far was benign, including blood work, EMG and NCV testing in 7/14, skin Bx and her C spine MRI showed post surgical changes. 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 I will see her back routinely in 6 months, sooner if the need arises. She was in agreement.  I spent 15 minutes in total face-to-face time with the patient, more than 50% of which was spent in counseling and coordination of care, reviewing test results, reviewing medication and discussing or reviewing the diagnosis of neuropathy, its prognosis and treatment options.

## 2014-06-14 NOTE — Patient Instructions (Signed)
Let's reduce the gabapentin to 300 mg twice daily. Stay active mentally and physically, drink more water.

## 2014-12-16 ENCOUNTER — Encounter: Payer: Self-pay | Admitting: Neurology

## 2014-12-16 ENCOUNTER — Ambulatory Visit (INDEPENDENT_AMBULATORY_CARE_PROVIDER_SITE_OTHER): Payer: BLUE CROSS/BLUE SHIELD | Admitting: Neurology

## 2014-12-16 VITALS — BP 162/96 | HR 82 | Resp 16 | Ht 62.0 in | Wt 160.0 lb

## 2014-12-16 DIAGNOSIS — M79672 Pain in left foot: Secondary | ICD-10-CM | POA: Diagnosis not present

## 2014-12-16 DIAGNOSIS — M79671 Pain in right foot: Secondary | ICD-10-CM | POA: Diagnosis not present

## 2014-12-16 DIAGNOSIS — G609 Hereditary and idiopathic neuropathy, unspecified: Secondary | ICD-10-CM

## 2014-12-16 NOTE — Patient Instructions (Signed)
Your exam is largely stable, but I would like to investigate things further to look for evidence of neuropathy or nerve damage; therefore, I would like to repeat your electrical muscles and nerve test, EMG/NCV. Neuropathy or nerve disease or damage can be caused by a variety of causes, most commonly diabetes, some toxins including alcohol or metabolic derangements or hereditary disorders.   We will continue with gabapentin 300 mg 2 times a day.

## 2014-12-16 NOTE — Progress Notes (Signed)
Subjective:    Patient ID: Laura [name] is a 59 y.o. female.  HPI     Interim history:   Laura [name] is a very pleasant 59 year-old right-handed woman with an underlying medical history of hypertension, reflux disease, insomnia, and peripheral neuropathy who presents for followup consultation of her neuropathy and complaint of foot pain, and concern for restless leg syndrome. She is unaccompanied today. I last saw her on 06/14/2014, at which time she reported weight gain and swelling of her ankles. She was on hydrochlorothiazide. She had some pain in her left hand at times. She had done well after her left shoulder surgery. She felt that her foot burning sensation was unchanged. She had no new symptoms. She was followed by psychiatry regularly for her mood disorder and stress. She had officially adopted her 47-year-old granddaughter. I suggested she reduce gabapentin to 300 mg twice daily because of excess sedation during the day.   Today, 12/16/2014: She reports having ongoing foot pain, worse on the left. She feels that symptoms are slightly worse than before. She continues to take Seroquel at night and feels that it makes her sleepy. She takes it at 5 PM and goes to bed around 9 PM. She was told to take Seroquel long-acting 3-4 hours before her bedtime. She also takes occasional Valium. She takes as needed Remeron at night for sleep. She continues to take Norco as needed, usually twice daily. She is on gabapentin 300 mg twice daily. She used to see a podiatrist but has not seen him in a while. She feels that there is crunching noises in her right big toe when she moves it.  Previously:  I saw her on 12/14/2013, at which time she reported taking Seroquel long-acting 400 mg each night. She was more groggy during the day. She did not wake up rested. She had gained some weight. She fell some 3 months prior and scraped her right leg. She was reporting some memory issues. He felt sluggish and forgetful. She  had left shoulder pain. She had an MRI of the left shoulder through her orthopedic doctor. She had mild arthritic changes and she stated she may need arthroscopic surgery to the left shoulder. I suggested she reduce gabapentin from 300 mg 3 times a day to twice daily because of sedation reported.   I saw her on 06/12/2013, at which time she reported significant stressors. She reported some neck stiffness and left-sided sciatica. Of note, she had neck surgery under Dr. Vertell Limber in 1998 and lower back surgery in 2001. She reported recurrent UTIs. She was going to see a urologist. She was on gabapentin 100 mg twice daily and 200 mg at night. She was in outpatient physical therapy. She was on antibiotics and prednisone for upper respiratory infection. She reported some residual restless leg symptoms. I suggested a second opinion with Dr. Krista Blue. she saw Dr. Krista Blue on 06/19/2013. She ordered a cervical spine MRI which was done on 06/29/2013: Abnormal MR scan of the cervical spine showing postoperative changes with a cervical fusion at C5-C6 with broad-based disc protrusion at C6-7 resulting in mild canal narrowing but without significant root impingement. Dr. Krista Blue also suggested a skin biopsy, this was normal. Dr. Krista Blue increased her gabapentin to 300 mg tid.    I saw her on 10/24/2012, at which time I did not suggest any medication changes but added more blood work. We did some PT. We talked about her prior blood work and an Public affairs consultant results in  detail last time. I added CK, HTLV antibodies, vitamin B6, heavy metal screen in blood/urine. All of these were fine. We called her with the test results. In the interim, she had right knee arthroscopic surgery on 05/26/2013 for meniscus tears.  I first met her on 07/27/2012, at which time she was on Valium, hydrocodone, gabapentin, Protonix, prednisone 5 mg daily, Ambien as needed, Norvasc, Wellbutrin, Fosamax, Flexeril, magnesium oxide. 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 Her half sister was diagnosed with neuropathy and the patient saw a podiatrist, who did X-rays and told her she had plantar fasciitis. She c/o paresthesias, cramping, burning sensation, which started in her soles and advanced to her foot and ankle areas bilaterally. She also endorsed some RLS symptoms. She has been given a cream by her foot doctor and when she started gabapentin, she had blisters in her mouth, which resolved. She had one syncopal event at work after starting gabapentin for a few days and after a week she fell at home, bumping her head without LOC reported. She had no further issues like this. She had had dizziness and lightheadedness and pre-syncopal spells even before she started the gabapentin. She had been taking the hydrocodone sparingly as she raises her 60 yo GD and knows the narcotic sedates her. She endorsed significant stressors, including losing her daughter to MI some 2 years ago and her GD's father (pt's son) has had issues with drugs, she reported.   She works full-time at a [employer] and her job involves Media planner. She tries not to take hydrocodone or Valium at work. She does complaining of considerable pain during the day and the work week has been exceedingly difficult for her. She has 8 or 10 hour days. She was placed on FMLA by her PCP. She was wondering whether her significant stressors were contributing to her problem.   At the time of her visit in 6/14 I suggested further blood work as well as EMG and nerve conduction studies. She had the EMG and nerve conduction test on 08/24/2012: normal nerve conduction, normal EMG. There was no evidence of peripheral neuropathy, however, small fiber neuropathy may be missed by a standard nerve conduction test.   Her blood work was unremarkable and I we called her on 08/02/2012 with the results. She had normal vitamin D, normal B12, normal CBC, normal CMP and the only thing that came back mildly abnormal was  a borderline low vitamin D and she was advised to start an over-the-counter vitamin D supplement. She had mildly elevated platelets which was nonspecific.   Her Past Medical History Is Significant For: Past Medical History  Diagnosis Date  . Anxiety   . OA (osteoarthritis) of knee     RIGHT  . Unspecified hereditary and idiopathic peripheral neuropathy   . Hypertension   . RLS (restless legs syndrome)   . GERD (gastroesophageal reflux disease)   . H/O hiatal hernia   . Hepatitis B antibody positive   . IBS (irritable bowel syndrome)   . Rotator cuff impingement syndrome of left shoulder   . Acromioclavicular joint arthritis     left shoulder  . History of melanoma excision     01-05-2014  . 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 Carpal tunnel release Right 1995  . Lumbar disc surgery  08-22-2003    LEFT  L5  --  S1  . Cholecystectomy  1996  . Carpal tunnel release Right 1990's  . Total abdominal hysterectomy w/ bilateral salpingoophorectomy  1999  . Anterior cervical decomp/discectomy fusion  2000    C4 -- C5  . Knee arthroscopy with medial menisectomy Right 05/25/2013    Procedure: RIGHT KNEE ARTHROSCOPY WITH PARTIAL LATERAL and MEDIAL MENISECTOMY AND CHONDROPLASTY;  Surgeon: Sydnee Cabal, MD;  Location: [facility];  Service: Orthopedics;  Laterality: Right;  . Mohs surgery  01-05-2014    LEFT SIDE OF FACE  . Shoulder arthroscopy with subacromial decompression Left 01/29/2014    Procedure: LEFT SHOULDER ARTHROSCOPY WITH SUBACROMIAL DECOMPRESSION/DISTAL CLAVICLE RESECTION AND DEBRIDEMENT;  Surgeon: Sydnee Cabal, MD;  Location: [facility];  Service: Orthopedics;  Laterality: Left;    Her Family History Is Significant For: Family History  Problem Relation Age of Onset  . Cancer Mother   . HIV/AIDS Father     Her Social History Is Significant  For: Social History   Social History  . Marital Status: Married    Spouse Name: Laura [name]  . Number of Children: 3  . Years of Education: GED   Occupational History  .      not employed   Social History Main Topics  . Smoking status: Current Every Day Smoker -- 0.75 packs/day for 20 years    Types: Cigarettes  . Smokeless tobacco: Never Used  . Alcohol Use: 0.0 oz/week    0 Standard drinks or equivalent per week     Comment: VERY RARE  . Drug Use: No  . Sexual Activity: Not Asked   Other Topics Concern  . None   Social History Narrative   Patient lives at home with her husband Laura [name]).    Patient is not working as time but she trying for disability.   Education 10th grade   Caffeine daily coffee and mountain dew . One can daily.   Marland Kitchen          Her Allergies Are:  Allergies  Allergen Reactions  . Celebrex [Celecoxib] Hives  :   Her Current Medications Are:  Outpatient Encounter Prescriptions as of 12/16/2014  Medication Sig  . cholecalciferol (VITAMIN D) 1000 UNITS tablet Take 1,000 Units by mouth daily.  . citalopram (CELEXA) 20 MG tablet Take 20 mg by mouth daily.   . diazepam (VALIUM) 5 MG tablet Take 1 tablet by mouth every 8 (eight) hours as needed.   . dicyclomine (BENTYL) 20 MG tablet Take 20 mg by mouth 3 (three) times daily as needed.   . gabapentin (NEURONTIN) 300 MG capsule One capsule two to three times daily as directed  . Ginkgo Biloba 40 MG TABS Take by mouth.  . hydrochlorothiazide (MICROZIDE) 12.5 MG capsule Take 12.5 mg by mouth every morning.   Marland Kitchen HYDROcodone-acetaminophen (NORCO) 10-325 MG per tablet Take 1 tablet by mouth.  . losartan (COZAAR) 100 MG tablet Take 100 mg by mouth every morning.   . mirtazapine (REMERON) 15 MG tablet Take 15 mg by mouth at bedtime as needed.   . Multiple Vitamin (MULTIVITAMIN) tablet Take 1 tablet by mouth daily.  . Omega-3 Fatty Acids (FISH OIL) 1000 MG CAPS Take 2 capsules by mouth daily.  . pantoprazole  (PROTONIX) 40 MG tablet Take 40 mg by mouth 2 (two) times daily.   . QUEtiapine (SEROQUEL XR) 300 MG 24 hr tablet Take 600 mg by mouth 1 day or 1 dose.  . vitamin E 400 UNIT capsule Take 400 Units  by mouth daily.  . [DISCONTINUED] estradiol (ESTRACE) 0.1 MG/GM vaginal cream Place 1 Applicatorful vaginally daily.  . [DISCONTINUED] SEROQUEL XR 400 MG 24 hr tablet Take 300 mg by mouth at bedtime.   . [DISCONTINUED] VESICARE 5 MG tablet Take 2.5 mg by mouth every morning. 1/2 tablet daily   No facility-administered encounter medications on file as of 12/16/2014.  :  Review of Systems:  Out of a complete 14 point review of systems, all are reviewed and negative with the exception of these symptoms as listed below:   Review of Systems  Neurological:       Patient reports that she is still having swelling in feet. Pain felt in top of L foot and toes. Pain in big toe of R foot. States her "pins and needles" sensation is more intense then at last visit.     Objective:  Neurologic Exam  Physical Exam Physical Examination:   Filed Vitals:   12/16/14 1113  BP: 162/96  Pulse: 82  Resp: 16   General Examination: The patient is a very pleasant 59 y.o. female in no acute distress. She appears well-developed and well-nourished and adequately groomed.   HEENT: Normocephalic, atraumatic, pupils are equal, round and reactive to light and accommodation. Extraocular tracking is good without limitation to gaze excursion or nystagmus noted. Normal smooth pursuit is noted. Funduscopy is normal. Hearing is grossly intact. Face is symmetric with normal facial animation and normal facial sensation. Speech is clear with no dysarthria noted. There is no hypophonia. There is no lip, neck/head, jaw or voice tremor. Neck is supple with full range of passive and active motion. There are no carotid bruits on auscultation. Oropharynx exam reveals: mild to moderate mouth dryness, adequate dental hygiene and no airway  crowding.  She has mild pharyngeal erythema. Mallampati is class II. Tongue protrudes centrally and palate elevates symmetrically.    Chest: Clear to auscultation without wheezing, rhonchi or crackles noted.  Heart: S1+S2+0, regular and normal without murmurs, rubs or gallops noted.   Abdomen: Soft, non-tender and non-distended with normal bowel sounds appreciated on auscultation.  Extremities: There is no pitting edema in the ankles bilaterally, right a little bit more than left. She has healing scars from the knee surgery with minimal swelling and some soreness. Pedal pulses are intact.   Skin: Warm and dry without trophic changes noted. There are no varicose veins, except for a few prominent appearing veins.  Musculoskeletal: exam reveals no obvious joint deformities, tenderness or joint swelling or erythema, except for mild arthritic changes in both hands and  well-healed scars from her left shoulder surgery. She has no abnormality palpable or visible in the left dorsum of the hand.     Neurologically:  Mental status: The patient is awake, alert and oriented in all 4 spheres. Her memory, attention, language and knowledge are appropriate. There is no aphasia, agnosia, apraxia or anomia. Speech is clear with normal prosody and enunciation. Thought process is linear. Mood is congruent and affect is flat.  Cranial nerves are as described above under HEENT exam. In addition, shoulder shrug is normal with equal shoulder height noted. Motor exam: Normal bulk, strength and tone is noted. There is no drift, tremor or rebound. Romberg is negative. Reflexes are 2+ throughout, including ankles. Toes are downgoing bilaterally. Fine motor skills are intact with normal finger taps, normal hand movements, normal rapid alternating patting, normal foot taps and normal foot agility.  Cerebellar testing shows no dysmetria or intention tremor on  finger to nose testing. Sensory exam is intact to light touch,  pinprick, vibration, temperature sense in the upper and lower extremities, with the exception of mild decrease in pinprick sensation, and vibration sense in her feet bilaterally, up to shin areas above the ankles, fairly stable from last time. Gait, station and balance: she stands up slowly. She walks slowly and has a slight limp on the right, unchanged. She turns slowly. She walks with her cane, but can walk some steps without it.   Assessment and Plan:   In summary, JALEESA CERVI is a very pleasant 59 year old female with an underlying medical history of hypertension, reflux disease, mood disorder, insomnia, s/p R knee arthroscopic surgery and L shoulder arthroscopic surgery on 01/29/2014, who has a history of burning foot pain and tingling as well as some restless leg symptoms (with the latter not being a big player at this time). Her neurological exam is stable. She has a family history of PN. She has been on gabapentin for symptomatic relief, but has had some sedation from it, which I asked her to reduce it to bid. In addition, she still feels sedated from Seroquel, which is 300 mg once daily long acting.  she has an appointment with her psychiatrist tomorrow. In the past couple of years, her workup for neuropathy has been fairly unremarkable. She had EMG and NCV testing in 7/14, skin Bx  under Dr. Krista Blue and C spine MRI showed post surgical changes.  We did extensive blood work as well. 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 For her right big toe pain and crepitation noted per patient report, she is advised to see her foot doctor again. I suggested we repeat her EMG nerve conduction test as it has been over 2 years and we can compare findings. She was in agreement. I will see her back routinely in 6 months and we will keep you posted regarding her EMG test results. I spent 20 minutes in total  face-to-face time with the patient, more than 50% of which was spent in counseling and coordination of care, reviewing test results, reviewing medication and discussing or reviewing the diagnosis of neuropathy, its prognosis and treatment options.

## 2015-02-14 ENCOUNTER — Ambulatory Visit (INDEPENDENT_AMBULATORY_CARE_PROVIDER_SITE_OTHER): Payer: BLUE CROSS/BLUE SHIELD | Admitting: Neurology

## 2015-02-14 ENCOUNTER — Ambulatory Visit (INDEPENDENT_AMBULATORY_CARE_PROVIDER_SITE_OTHER): Payer: Self-pay | Admitting: Neurology

## 2015-02-14 ENCOUNTER — Telehealth: Payer: Self-pay

## 2015-02-14 ENCOUNTER — Encounter: Payer: Self-pay | Admitting: Neurology

## 2015-02-14 DIAGNOSIS — M79672 Pain in left foot: Secondary | ICD-10-CM

## 2015-02-14 DIAGNOSIS — G609 Hereditary and idiopathic neuropathy, unspecified: Secondary | ICD-10-CM

## 2015-02-14 DIAGNOSIS — M79671 Pain in right foot: Secondary | ICD-10-CM

## 2015-02-14 NOTE — Telephone Encounter (Signed)
I spoke to patient. She voiced understanding. She would like referral to [facility]. Per Dr. Rexene Alberts, ok to start referral process.

## 2015-02-14 NOTE — Progress Notes (Signed)
Quick Note:  Please call and advise the patient that the recent EMG and nerve conduction velocity test, which is the electrical nerve and muscle test we we performed, was reported as within normal limits. We checked for abnormal electrical discharges in the muscles or nerves and the report suggested normal findings. Compared to prior test in 2014, also no change, which is all in all reassuring.  No further action is required on this test at this time. Please remind patient to keep any upcoming appointments or tests and to call us with any interim questions, concerns, problems or updates. Thanks,  Star Age, MD, PhD   ______

## 2015-02-14 NOTE — Telephone Encounter (Signed)
I spoke to Laura [name] and she is aware of results.   Laura [name] wanted me to talk to Dr. Rexene Alberts.... She is filing for disability and is wondering if we can write a letter for her. She has idiopathic peripheral neuropathy but her testing does not show proof of it. [name] asks for a letter confirming her diagnosis and how it can cause trouble for her in carrying out duties at work.

## 2015-02-14 NOTE — Telephone Encounter (Signed)
-----   Message from Star Age, MD sent at 02/14/2015 11:29 AM EST ----- Please call and advise the patient that the recent EMG and nerve conduction velocity test, which is the electrical nerve and muscle test we we performed, was reported as within normal limits. We checked for abnormal electrical discharges in the muscles or nerves and the report suggested normal findings. Compared to prior test in 2014, also no change, which is all in all reassuring.  No further action is required on this test at this time. Please remind patient to keep any upcoming appointments or tests and to call us with any interim questions, concerns, problems or updates. Thanks,  Star Age, MD, PhD

## 2015-02-14 NOTE — Progress Notes (Signed)
Please refer to EMG and nerve conduction study procedure note. 

## 2015-02-14 NOTE — Procedures (Signed)
     HISTORY:  Laura [name] is a 60 year old patient with a prior history of lumbar and cervical spine surgery. She reports chronic issues with discomfort in both feet, some sciatica pain down the left leg. She reports tingling below the knees bilaterally. She is being evaluated for a possible neuropathy or a lumbar radiculopathy.  NERVE CONDUCTION STUDIES:  Nerve conduction studies were performed on both lower extremities. The distal motor latencies and motor amplitudes for the peroneal and posterior tibial nerves were within normal limits. The nerve conduction velocities for these nerves were also normal. The H reflex latencies were normal. The sensory latencies for the peroneal nerves were within normal limits.   EMG STUDIES:  EMG study was performed on the right lower extremity:  The tibialis anterior muscle reveals 2 to 4K motor units with full recruitment. No fibrillations or positive waves were seen. The peroneus tertius muscle reveals 2 to 4K motor units with full recruitment. No fibrillations or positive waves were seen. The medial gastrocnemius muscle reveals 1 to 3K motor units with full recruitment. No fibrillations or positive waves were seen. The vastus lateralis muscle reveals 2 to 4K motor units with full recruitment. No fibrillations or positive waves were seen. The iliopsoas muscle reveals 2 to 4K motor units with full recruitment. No fibrillations or positive waves were seen. The biceps femoris muscle (long head) reveals 2 to 4K motor units with full recruitment. No fibrillations or positive waves were seen. The lumbosacral paraspinal muscles were tested at 3 levels, and revealed no abnormalities of insertional activity at all 3 levels tested. There was good relaxation.   IMPRESSION:  Nerve conduction studies done on both lower extremities were within normal limits. No evidence of a peripheral neuropathy is seen. A small fiber neuropathy may not be apparent by standard  nerve conduction studies, clinical correlation is required. EMG evaluation of the right lower extremity was unremarkable, no evidence of an overlying lumbosacral radiculopathy was seen. On a previous study done on 08/24/2012, nerve conduction studies of the right arm and both legs were normal, EMG evaluations on both legs were normal. This study today is unchanged from the prior evaluation.  Jill Alexanders MD 02/14/2015 11:02 AM  [facility] [address] [place], [place] [address]  Phone [phone] Fax [phone]

## 2015-02-14 NOTE — Telephone Encounter (Signed)
Unfortunately, based on her current test results and exam I would not be able to justify disability. Her skin Bx under Dr. Krista Blue was also normal in 2015. We can request another opinion with a neuropathy specialist at a bigger medical center, such as [facility] or [facility], if she wishes to see, if her Dx can be narrowed down. Pls let patient know. If she would like to proceed with referral pls place referral and let me know. thx

## 2015-02-17 ENCOUNTER — Other Ambulatory Visit: Payer: Self-pay | Admitting: Neurology

## 2015-02-27 ENCOUNTER — Encounter: Payer: Self-pay | Admitting: Neurology

## 2015-02-27 ENCOUNTER — Ambulatory Visit (INDEPENDENT_AMBULATORY_CARE_PROVIDER_SITE_OTHER): Payer: BLUE CROSS/BLUE SHIELD | Admitting: Neurology

## 2015-02-27 VITALS — BP 138/94 | HR 106 | Ht 61.5 in | Wt 172.5 lb

## 2015-02-27 DIAGNOSIS — R202 Paresthesia of skin: Secondary | ICD-10-CM | POA: Insufficient documentation

## 2015-02-27 NOTE — Progress Notes (Signed)
Reason for visit: Leg discomfort  Laura [name] is an 60 y.o. female  History of present illness:  Laura [name] is a 60 year old right-handed white female with a history of onset of foot discomfort that began in January 2014. The patient indicates that when she is off of her feet she feels better, but first thing in the morning when she first bears weight on her feet she has significant discomfort. If she walks for 45 minutes or an hour, the pain improves a bit, but if she is up on her feet too long the pain will then worsen again. The patient has a burning sensation in the bottom of the foot, worse at the heel. The patient has been seen by a podiatrist who felt she had bilateral plantar fasciitis, she was given a cream to put on her foot but this did not offer much benefit. This type of pain issue has continued, she is using a cane for ambulation because of the pain. She has also noted some lancinating pains in the lower extremities below the knees, she has some occasional tingling in the fingers of the hand. She has a history of cervical and lumbar spine surgery in the past. She has had evaluation by MRI of the cervical spine in 2015. Nerve conduction studies and EMG evaluation have not shown evidence of a peripheral neuropathy. 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 The patient has been on gabapentin with some benefit. She does report some mild gait instability, with occasional falls. She does have some urgency of the bowels and the bladder. She reports some neck discomfort, she is not having a lot of back pain but occasionally she will have some discomfort down the left leg consistent with sciatica. She is sent to this office for further evaluation.  Past Medical History  Diagnosis Date  . Anxiety   . OA (osteoarthritis) of knee     RIGHT  . Unspecified hereditary and idiopathic peripheral neuropathy   .  Hypertension   . RLS (restless legs syndrome)   . GERD (gastroesophageal reflux disease)   . H/O hiatal hernia   . Hepatitis B antibody positive   . IBS (irritable bowel syndrome)   . Rotator cuff impingement syndrome of left shoulder   . Acromioclavicular joint arthritis     left shoulder  . History of melanoma excision     01-05-2014  . 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 Carpal tunnel release Right 1995  . Lumbar disc surgery  08-22-2003    LEFT  L5  --  S1  . Cholecystectomy  1996  . Carpal tunnel release Right 1990's  . Total abdominal hysterectomy w/ bilateral salpingoophorectomy  1999  . Anterior cervical decomp/discectomy fusion  2000    C4 -- C5  . Knee arthroscopy with medial menisectomy Right 05/25/2013    Procedure: RIGHT KNEE ARTHROSCOPY WITH PARTIAL LATERAL and MEDIAL MENISECTOMY AND CHONDROPLASTY;  Surgeon: Sydnee Cabal, MD;  Location: [facility];  Service: Orthopedics;  Laterality: Right;  . Mohs surgery  01-05-2014    LEFT SIDE OF FACE  . Shoulder arthroscopy with subacromial decompression Left 01/29/2014    Procedure: LEFT SHOULDER ARTHROSCOPY WITH SUBACROMIAL DECOMPRESSION/DISTAL CLAVICLE RESECTION AND DEBRIDEMENT;  Surgeon: Sydnee Cabal, MD;  Location: [facility];  Service: Orthopedics;  Laterality: Left;    Family History  Problem Relation Age of Onset  . Cancer Mother   . HIV/AIDS Father     Social history:  reports that she quit smoking about 4 weeks ago. Her smoking use included Cigarettes. She has a 15 pack-year smoking history. She has never used smokeless tobacco. She reports that she drinks alcohol. She reports that she does not use illicit drugs.    Allergies  Allergen Reactions  . Celebrex [Celecoxib] Hives    Medications:  Prior to Admission medications   Medication Sig Start Date End Date Taking? Authorizing Provider  cholecalciferol (VITAMIN D) 1000  UNITS tablet Take 1,000 Units by mouth daily.   Yes Historical Provider, MD  citalopram (CELEXA) 20 MG tablet Take 40 mg by mouth daily.  11/28/13  Yes Historical Provider, MD  diazepam (VALIUM) 5 MG tablet Take 1 tablet by mouth every 8 (eight) hours as needed.  07/06/12  Yes Historical Provider, MD  dicyclomine (BENTYL) 20 MG tablet Take 20 mg by mouth 3 (three) times daily as needed.  05/24/12  Yes Historical Provider, MD  gabapentin (NEURONTIN) 300 MG capsule TAKE 1 CAPSULE BY MOUTH TWICE DAILY TO THREE TIMES DAILY AS DIRECTED 02/17/15  Yes Star Age, MD  Ginkgo Biloba 40 MG TABS Take by mouth.   Yes Historical Provider, MD  hydrochlorothiazide (MICROZIDE) 12.5 MG capsule Take 12.5 mg by mouth every morning.  07/23/12  Yes Historical Provider, MD  HYDROcodone-acetaminophen (NORCO) 10-325 MG per tablet Take 1 tablet by mouth every 6 (six) hours as needed.    Yes Historical Provider, MD  losartan (COZAAR) 100 MG tablet Take 100 mg by mouth every morning.  07/23/12  Yes Historical Provider, MD  mirtazapine (REMERON) 15 MG tablet Take 15 mg by mouth at bedtime as needed.  10/26/13  Yes Historical Provider, MD  Multiple Vitamin (MULTIVITAMIN) tablet Take 1 tablet by mouth daily.   Yes Historical Provider, MD  Omega-3 Fatty Acids (FISH OIL) 1000 MG CAPS Take 2 capsules by mouth daily.   Yes Historical Provider, MD  pantoprazole (PROTONIX) 40 MG tablet Take 40 mg by mouth 2 (two) times daily.  07/23/12  Yes Historical Provider, MD  QUEtiapine (SEROQUEL XR) 300 MG 24 hr tablet Take 600 mg by mouth 1 day or 1 dose.   Yes Historical Provider, MD  vitamin E 400 UNIT capsule Take 400 Units by mouth daily.   Yes Historical Provider, MD    ROS:  Out of a complete 14 system review of symptoms, the patient complains only of the following symptoms, and all other reviewed systems are negative.  Weight gain, fatigue Palpitations of the heart Blurred vision Diarrhea, constipation Increased thirst Muscle  cramps Memory loss, confusion, numbness, difficulty swallowing, dizziness, passing out Depression, anxiety, too much sleep, racing thoughts Snoring  Blood pressure 138/94, pulse 106, height 5' 1.5" (1.562 m), weight 172 lb 8 oz (78.245 kg).  Physical Exam  General: The patient is alert and cooperative at the time of the examination. The patient is moderately obese.  Eyes: Pupils are equal, round, and reactive to light. Discs are flat bilaterally.  Neck: The neck is supple, no carotid bruits are noted.  Respiratory: The respiratory examination is clear.  Cardiovascular: The cardiovascular examination reveals a regular rate and rhythm, no obvious murmurs or rubs are noted.  Neuromuscular: The patient has flexion of the low back to about 120. The patient has relatively full range of movement of the cervical spine.  Skin:  Extremities are without significant edema.  Neurologic Exam  Mental status: The patient is alert and oriented x 3 at the time of the examination. The patient has apparent normal recent and remote memory, with an apparently normal attention span and concentration ability.  Cranial nerves: Facial symmetry is present. There is good sensation of the face to pinprick and soft touch bilaterally. The strength of the facial muscles and the muscles to head turning and shoulder shrug are normal bilaterally. Speech is well enunciated, no aphasia or dysarthria is noted. Extraocular movements are full. Visual fields are full. The tongue is midline, and the patient has symmetric elevation of the soft palate. No obvious hearing deficits are noted.  Motor: The motor testing reveals 5 over 5 strength of all 4 extremities. Good symmetric motor tone is noted throughout.  Sensory: Sensory testing is intact to pinprick, soft touch, vibration sensation, and position sense on all 4 extremities, with exception of some decreased pinprick sensation on the left leg and foot as compared to the  right, decreased vibration sensation on the left arm and left leg. No evidence of extinction is noted.  Coordination: Cerebellar testing reveals good finger-nose-finger and heel-to-shin bilaterally.  Gait and station: Gait is associated with a limping quality on the right leg, the patient usually uses a cane for ambulation. Tandem gait is normal. Romberg is negative. No drift is seen.  Reflexes: Deep tendon reflexes are symmetric and normal bilaterally. Deep tendon reflexes are well-maintained on all 4 extremities with excellent ankle jerk reflexes bilaterally. Toes are downgoing bilaterally.    Assessment/Plan:  1. Lower extremity discomfort, probable bilateral plantar fasciitis  2. Lancinating pains of the legs  The patient has a history of prior cervical and lumbar spine surgery. The patient does not have a peripheral neuropathy. The diagnosis of peripheral neuropathy has been excluded from nerve conduction studies and skin biopsy. There is no evidence of an axonal peripheral neuropathy, no evidence of a small fiber neuropathy. The patient likely does have bilateral plantar fasciitis. The patient has pain with weightbearing, worse first thing in the morning. The patient does have some other sensory alterations involving the arms and legs, etiology of this is not clear. The patient will be sent for MRI of the lumbar spine, and she will be sent for blood work today. We may consider MRI of the brain in the future if these studies are unrevealing. The patient needs to be reevaluated through her podiatrist.   Jill Alexanders MD 02/27/2015 7:37 PM  [facility] [address] [place], [place] [address]  Phone [phone] Fax [phone]

## 2015-02-27 NOTE — Patient Instructions (Addendum)
   We will check blood work today and get MRI of the lumbar spine.  Paresthesia Paresthesia is an abnormal burning or prickling sensation. This sensation is generally felt in the hands, arms, legs, or feet. However, it may occur in any part of the body. Usually, it is not painful. The feeling may be described as:  Tingling or numbness.  Pins and needles.  Skin crawling.  Buzzing.  Limbs falling asleep.  Itching. Most people experience temporary (transient) paresthesia at some time in their lives. Paresthesia may occur when you breathe too quickly (hyperventilation). It can also occur without any apparent cause. Commonly, paresthesia occurs when pressure is placed on a nerve. The sensation quickly goes away after the pressure is removed. For some people, however, paresthesia is a long-lasting (chronic) condition that is caused by an underlying disorder. If you continue to have paresthesia, you may need further medical evaluation. HOME CARE INSTRUCTIONS Watch your condition for any changes. Taking the following actions may help to lessen any discomfort that you are feeling:  Avoid drinking alcohol.  Try acupuncture or massage to help relieve your symptoms.  Keep all follow-up visits as directed by your health care provider. This is important. SEEK MEDICAL CARE IF:  You continue to have episodes of paresthesia.  Your burning or prickling feeling gets worse when you walk.  You have pain, cramps, or dizziness.  You develop a rash. SEEK IMMEDIATE MEDICAL CARE IF:  You feel weak.  You have trouble walking or moving.  You have problems with speech, understanding, or vision.  You feel confused.  You cannot control your bladder or bowel movements.  You have numbness after an injury.  You faint.   This information is not intended to replace advice given to you by your health care provider. Make sure you discuss any questions you have with your health care provider.    Document Released: 01/08/2002 Document Revised: 06/04/2014 Document Reviewed: 01/14/2014 Elsevier Interactive Patient Education [employer].

## 2015-03-01 LAB — HIV ANTIBODY (ROUTINE TESTING W REFLEX): HIV Screen 4th Generation wRfx: NONREACTIVE

## 2015-03-01 LAB — B. BURGDORFI ANTIBODIES

## 2015-03-01 LAB — RPR: RPR: NONREACTIVE

## 2015-03-01 LAB — ANGIOTENSIN CONVERTING ENZYME: Angio Convert Enzyme: 90 U/L — ABNORMAL HIGH (ref 14–82)

## 2015-03-01 LAB — COPPER, SERUM: Copper: 101 ug/dL (ref 72–166)

## 2015-03-17 ENCOUNTER — Ambulatory Visit (HOSPITAL_COMMUNITY)
Admission: RE | Admit: 2015-03-17 | Discharge: 2015-03-17 | Disposition: A | Discharge status: Home / Self Care | Payer: BLUE CROSS/BLUE SHIELD | Source: Ambulatory Visit | Attending: Neurology | Admitting: Neurology

## 2015-03-17 ENCOUNTER — Telehealth: Payer: Self-pay | Admitting: Neurology

## 2015-03-17 DIAGNOSIS — M79605 Pain in left leg: Secondary | ICD-10-CM | POA: Diagnosis not present

## 2015-03-17 DIAGNOSIS — M5136 Other intervertebral disc degeneration, lumbar region: Secondary | ICD-10-CM | POA: Diagnosis not present

## 2015-03-17 DIAGNOSIS — Z981 Arthrodesis status: Secondary | ICD-10-CM | POA: Insufficient documentation

## 2015-03-17 DIAGNOSIS — M545 Low back pain: Secondary | ICD-10-CM | POA: Diagnosis present

## 2015-03-17 DIAGNOSIS — M47897 Other spondylosis, lumbosacral region: Secondary | ICD-10-CM | POA: Diagnosis not present

## 2015-03-17 DIAGNOSIS — R202 Paresthesia of skin: Secondary | ICD-10-CM | POA: Diagnosis not present

## 2015-03-17 DIAGNOSIS — G8929 Other chronic pain: Secondary | ICD-10-CM | POA: Insufficient documentation

## 2015-03-17 NOTE — Telephone Encounter (Signed)
I called patient. MRI of the low back does not show spinal stenosis, no evidence of nerve root impingement. If the sensory changes on the arms are worsening over time, evaluation of the cervical spine or brain can be done.   MRI lumbar 03/17/15:  IMPRESSION: 1. Although there is no overt impingement, there is borderline right foraminal narrowing at L5-S1 due to mildly progressive spondylosis and degenerative disc disease. Prior left laminectomy at this level without any left-sided impingement. 2. There is degenerative disc disease at the other lumbar levels, without impingement.

## 2015-04-16 ENCOUNTER — Other Ambulatory Visit: Payer: Self-pay | Admitting: Neurology

## 2015-06-16 ENCOUNTER — Other Ambulatory Visit: Payer: Self-pay | Admitting: Neurology

## 2015-06-16 ENCOUNTER — Encounter: Payer: Self-pay | Admitting: Neurology

## 2015-06-16 ENCOUNTER — Encounter: Payer: Self-pay | Admitting: Internal Medicine

## 2015-06-16 ENCOUNTER — Ambulatory Visit (INDEPENDENT_AMBULATORY_CARE_PROVIDER_SITE_OTHER): Payer: BLUE CROSS/BLUE SHIELD | Admitting: Neurology

## 2015-06-16 VITALS — BP 140/82 | HR 82 | Resp 16 | Ht 61.5 in | Wt 163.0 lb

## 2015-06-16 DIAGNOSIS — G4710 Hypersomnia, unspecified: Secondary | ICD-10-CM | POA: Diagnosis not present

## 2015-06-16 DIAGNOSIS — R0683 Snoring: Secondary | ICD-10-CM

## 2015-06-16 DIAGNOSIS — M79672 Pain in left foot: Secondary | ICD-10-CM | POA: Diagnosis not present

## 2015-06-16 DIAGNOSIS — E669 Obesity, unspecified: Secondary | ICD-10-CM

## 2015-06-16 DIAGNOSIS — R202 Paresthesia of skin: Secondary | ICD-10-CM

## 2015-06-16 DIAGNOSIS — R413 Other amnesia: Secondary | ICD-10-CM

## 2015-06-16 DIAGNOSIS — M79671 Pain in right foot: Secondary | ICD-10-CM

## 2015-06-16 DIAGNOSIS — R519 Headache, unspecified: Secondary | ICD-10-CM

## 2015-06-16 DIAGNOSIS — R51 Headache: Secondary | ICD-10-CM | POA: Diagnosis not present

## 2015-06-16 NOTE — Patient Instructions (Addendum)
Based on your symptoms and your exam I believe you are at risk for obstructive sleep apnea or OSA, and I think we should proceed with a sleep study to determine whether you do or do not have OSA and how severe it is. If you have more than mild OSA, I want you to consider treatment with CPAP. Please remember, the risks and ramifications of moderate to severe obstructive sleep apnea or OSA are: Cardiovascular disease, including congestive heart failure, stroke, difficult to control hypertension, arrhythmias, and even type 2 diabetes has been linked to untreated OSA. Sleep apnea causes disruption of sleep and sleep deprivation in most cases, which, in turn, can cause recurrent headaches, problems with memory, mood, concentration, focus, and vigilance. Most people with untreated sleep apnea report excessive daytime sleepiness, which can affect their ability to drive. Please do not drive if you feel sleepy.   I will likely see you back after your sleep study to go over the test results and where to go from there. We will call you after your sleep study to advise about the results (most likely, you will hear from Beverlee Nims, my nurse) and to set up an appointment at the time, as necessary.    Our sleep lab administrative assistant, Arrie Aran will meet with you or call you to schedule your sleep study. If you don't hear back from her by next week please feel free to call her at [phone]. This is her direct line and please leave a message with your phone number to call back if you get the voicemail box. She will call back as soon as possible.   For you memory complaints: let's do a brain scan, called MRI and call you with the test results. We will have to schedule you for this on a separate date. This test requires authorization from your insurance, and we will take care of the insurance process.  You may take your trazodone for your sleep study.

## 2015-06-16 NOTE — Progress Notes (Signed)
Subjective:    [name] ID: Laura [name] is a 60 y.o. female.  HPI     Interim history:   Laura [name] is a very pleasant 60 year-old right-handed woman with an underlying medical history of hypertension, reflux disease, insomnia, and peripheral neuropathy who presents for followup consultation of her foot pain. She is unaccompanied today. I last saw her on 12/16/2014, at which time she reported ongoing foot pain, worse on Laura left. She felt that her symptoms were slightly worse from before. She was taking Seroquel at night from another provider. She felt that it made her sleepy. She would take it at 5 PM and go to bed at 9 PM. She she was told to take long-acting Seroquel 3-4 hours before her bedtime. She was also taking Valium, as needed Remeron and Norco as needed, typically twice daily, gabapentin 300 mg twice daily. She used to see a podiatrist but had not seen one in a while. I suggested we repeat her EMG and nerve conduction test for comparison. I also advised her to see her podiatrist. I suggested an opinion with a neuromuscular specialist at [facility].  Today, 06/16/15: She reports unchanged foot symptoms. She reports additional issues with morning headaches, which is dull and achy, this has been going on for Laura past 2 months or so. Of note, she snores and has woken up with a sense of gasping or snorting. She is off of Seroquel and has been started on trazodone for sleep by her psychiatrist. She reports issues with short-term memory. She feels that she is getting more forgetful. She feels that her foot pain is unchanged, as well as unchanged tingling at times in her toes and fingers. She had repeat EMG and nerve conduction testing on 02/14/2015: IMPRESSION: Nerve conduction studies done on both lower extremities were within normal limits. No evidence of a peripheral neuropathy is seen. A small fiber neuropathy may not be apparent by standard nerve conduction studies, clinical correlation is  required. EMG evaluation of Laura right lower extremity was unremarkable, no evidence of an overlying lumbosacral radiculopathy was seen. On a previous study done on 08/24/2012, nerve conduction studies of Laura right arm and both legs were normal, EMG evaluations on both legs were normal. This study today is unchanged from Laura prior evaluation. We called her with her test results. She was seen in Laura interim by my colleague, Dr. Jannifer Franklin on 02/27/15 and I reviewed his note. He agreed with reevaluation with her podiatrist. He felt she did not have any evidence of neuropathy and that this was reliably excluded by a negative EMG testing, blood work, and biopsy testing in Laura past.   He suggested lumbar spine MRI. She had a lumbar spine MRI without contrast on 03/17/2015: IMPRESSION: 1. Although there is no overt impingement, there is borderline right foraminal narrowing at L5-S1 due to mildly progressive spondylosis and degenerative disc disease. Prior left laminectomy at this level without any left-sided impingement. 2. There is degenerative disc disease at Laura other lumbar levels, without impingement.  Dr. Jannifer Franklin called her with her test results.  Previously:  I saw her on 06/14/2014, at which time she reported weight gain and swelling of her ankles. She was on hydrochlorothiazide. She had some pain in her left hand at times. She had done well after her left shoulder surgery. She felt that her foot burning sensation was unchanged. She had no new symptoms. She was followed by psychiatry regularly for her mood disorder and stress. She had  officially adopted her 35-year-old granddaughter. I suggested she reduce gabapentin to 300 mg twice daily because of excess sedation during Laura day.   I saw her on 12/14/2013, at which time she reported taking Seroquel long-acting 400 mg each night. She was more groggy during Laura day. She did not wake up rested. She had gained some weight. She fell some 3 months prior and  scraped her right leg. She was reporting some memory issues. He felt sluggish and forgetful. She had left shoulder pain. She had an MRI of Laura left shoulder through her orthopedic doctor. She had mild arthritic changes and she stated she may need arthroscopic surgery to Laura left shoulder. I suggested she reduce gabapentin from 300 mg 3 times a day to twice daily because of sedation reported.  I saw her on 06/12/2013, at which time she reported significant stressors. She reported some neck stiffness and left-sided sciatica. Of note, she had neck surgery under Dr. Vertell Limber in 1998 and lower back surgery in 2001. She reported recurrent UTIs. She was going to see a urologist. She was on gabapentin 100 mg twice daily and 200 mg at night. She was in outpatient physical therapy. She was on antibiotics and prednisone for upper respiratory infection. She reported some residual restless leg symptoms. I suggested a second opinion with Dr. Krista Blue. she saw Dr. Krista Blue on 06/19/2013. She ordered a cervical spine MRI which was done on 06/29/2013: Abnormal MR scan of Laura cervical spine showing postoperative changes with a cervical fusion at C5-C6 with broad-based disc protrusion at C6-7 resulting in mild canal narrowing but without significant root impingement. Dr. Krista Blue also suggested a skin biopsy, this was normal. Dr. Krista Blue increased her gabapentin to 300 mg tid.   I saw her on 10/24/2012, at which time I did not suggest any medication changes but added more blood work. We did some PT. We talked about her prior blood work and an electrophysiological test results in detail last time. I added CK, HTLV antibodies, vitamin B6, heavy metal screen in blood/urine. All of these were fine. We called her with Laura test results. In Laura interim, she had right knee arthroscopic surgery on 05/26/2013 for meniscus tears.  I first met her on 07/27/2012, at which time she was on Valium, hydrocodone, gabapentin, Protonix, prednisone 5 mg daily, Ambien as  needed, Norvasc, Wellbutrin, Fosamax, Flexeril, magnesium oxide. 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 Her half sister was diagnosed with neuropathy and Laura [name] saw a podiatrist, who did X-rays and told her she had plantar fasciitis. She c/o paresthesias, cramping, burning sensation, which started in her soles and advanced to her foot and ankle areas bilaterally. She also endorsed some RLS symptoms. She has been given a cream by her foot doctor and when she started gabapentin, she had blisters in her mouth, which resolved. She had one syncopal event at work after starting gabapentin for a few days and after a week she fell at home, bumping her head without LOC reported. She had no further issues like this. She had had dizziness and lightheadedness and pre-syncopal spells even before she started Laura gabapentin. She had been taking Laura hydrocodone sparingly as she raises her 61 yo GD and knows Laura narcotic sedates her. She endorsed significant stressors, including losing her daughter to MI some 2 years ago and her GD's father (pt's son) has had issues with drugs, she reported.   She works full-time at a [employer] and her job involves Media planner.  She tries not to take hydrocodone or Valium at work. She does complaining of considerable pain during Laura day and Laura work week has been exceedingly difficult for her. She has 8 or 10 hour days. She was placed on FMLA by her PCP. She was wondering whether her significant stressors were contributing to her problem.   At Laura time of her visit in 6/14 I suggested further blood work as well as EMG and nerve conduction studies. She had Laura EMG and nerve conduction test on 08/24/2012: normal nerve conduction, normal EMG. There was no evidence of peripheral neuropathy, however, small fiber neuropathy may be missed by a standard nerve conduction test.   Her blood work was unremarkable and I we called her on 08/02/2012 with Laura results. She had normal  vitamin D, normal B12, normal CBC, normal CMP and Laura only thing that came back mildly abnormal was a borderline low vitamin D and she was advised to start an over-Laura-counter vitamin D supplement. She had mildly elevated platelets which was nonspecific.    Her Past Medical History Is Significant For: Past Medical History  Diagnosis Date  . Anxiety   . OA (osteoarthritis) of knee     RIGHT  . Unspecified hereditary and idiopathic peripheral neuropathy   . Hypertension   . RLS (restless legs syndrome)   . GERD (gastroesophageal reflux disease)   . H/O hiatal hernia   . Hepatitis B antibody positive   . IBS (irritable bowel syndrome)   . Rotator cuff impingement syndrome of left shoulder   . Acromioclavicular joint arthritis     left shoulder  . History of melanoma excision     01-05-2014  . 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 Carpal tunnel release Right 1995  . Lumbar disc surgery  08-22-2003    LEFT  L5  --  S1  . Cholecystectomy  1996  . Carpal tunnel release Right 1990's  . Total abdominal hysterectomy w/ bilateral salpingoophorectomy  1999  . Anterior cervical decomp/discectomy fusion  2000    C4 -- C5  . Knee arthroscopy with medial menisectomy Right 05/25/2013    Procedure: RIGHT KNEE ARTHROSCOPY WITH PARTIAL LATERAL and MEDIAL MENISECTOMY AND CHONDROPLASTY;  Surgeon: Sydnee Cabal, MD;  Location: [facility];  Service: Orthopedics;  Laterality: Right;  . Mohs surgery  01-05-2014    LEFT SIDE OF FACE  . Shoulder arthroscopy with subacromial decompression Left 01/29/2014    Procedure: LEFT SHOULDER ARTHROSCOPY WITH SUBACROMIAL DECOMPRESSION/DISTAL CLAVICLE RESECTION AND DEBRIDEMENT;  Surgeon: Sydnee Cabal, MD;  Location: [facility];  Service: Orthopedics;  Laterality: Left;    Her Family History Is Significant For: Family History  Problem  Relation Age of Onset  . Cancer Mother   . HIV/AIDS Father     Her Social History Is Significant For: Social History   Social History  . Marital Status: Married    Spouse Name: [name]  . Number of Children: 3  . Years of Education: GED   Occupational History  .      not employed   Social History Main Topics  . Smoking status: Former Smoker -- 0.75 packs/day for 20 years    Types: Cigarettes    Quit date: 01/26/2015  . Smokeless tobacco: Never Used  . Alcohol Use: 0.0 oz/week    0 Standard drinks or equivalent per week  Comment: VERY RARE  . Drug Use: No  . Sexual Activity: Not Asked   Other Topics Concern  . None   Social History Narrative   [name] lives at home with her husband [name]).    [name] is not working as time but she trying for disability.   Education 10th grade   Caffeine daily coffee and mountain dew . One can daily.   Marland Kitchen          Her Allergies Are:  Allergies  Allergen Reactions  . Aspirin Other (See Comments)    Severe stomach upset due to IBS.  Marland Kitchen Celebrex [Celecoxib] Hives  :   Her Current Medications Are:  Outpatient Encounter Prescriptions as of 06/16/2015  Medication Sig  . cholecalciferol (VITAMIN D) 1000 UNITS tablet Take 1,000 Units by mouth daily.  . citalopram (CELEXA) 20 MG tablet Take 40 mg by mouth daily.   . diazepam (VALIUM) 5 MG tablet Take 1 tablet by mouth every 8 (eight) hours as needed.   . dicyclomine (BENTYL) 20 MG tablet Take 20 mg by mouth 3 (three) times daily as needed.   . gabapentin (NEURONTIN) 300 MG capsule TAKE 1 CAPSULE BY MOUTH 2-3 TIMES DAILY AS DIRECTED  . Ginkgo Biloba 40 MG TABS Take by mouth.  . hydrochlorothiazide (MICROZIDE) 12.5 MG capsule Take 12.5 mg by mouth every morning.   Marland Kitchen HYDROcodone-acetaminophen (NORCO) 10-325 MG per tablet Take 1 tablet by mouth every 6 (six) hours as needed.   Marland Kitchen losartan (COZAAR) 100 MG tablet Take 100 mg by mouth every morning.   . Multiple Vitamin (MULTIVITAMIN)  tablet Take 1 tablet by mouth daily.  . Omega-3 Fatty Acids (FISH OIL) 1000 MG CAPS Take 2 capsules by mouth daily.  . pantoprazole (PROTONIX) 40 MG tablet Take 40 mg by mouth 2 (two) times daily.   . traZODone (DESYREL) 50 MG tablet   . vitamin E 400 UNIT capsule Take 400 Units by mouth daily.  . [DISCONTINUED] mirtazapine (REMERON) 15 MG tablet Take 15 mg by mouth at bedtime as needed.   . [DISCONTINUED] QUEtiapine (SEROQUEL XR) 300 MG 24 hr tablet Take 600 mg by mouth 1 day or 1 dose.   No facility-administered encounter medications on file as of 06/16/2015.  :  Review of Systems:  Out of a complete 14 point review of systems, all are reviewed and negative with Laura exception of these symptoms as listed below:   Review of Systems  Endocrine:       [name] reports that PCP has checked her A1C, and they are concerned about risk for diabetes.   Neurological:       [name] reports that she has a headache for 2 months. States that it is constant.     Objective:  Neurologic Exam  Physical Exam Physical Examination:   Filed Vitals:   06/16/15 1149  BP: 140/82  Pulse: 82  Resp: 16   General Examination: Laura [name] is a very pleasant 60 y.o. female in no acute distress. She appears well-developed and well-nourished and adequately groomed.   HEENT: Normocephalic, atraumatic, pupils are equal, round and reactive to light and accommodation. Extraocular tracking is good without limitation to gaze excursion or nystagmus noted. Normal smooth pursuit is noted. Hearing is grossly intact. Face is symmetric with normal facial animation and normal facial sensation. Speech is clear with no dysarthria noted. There is no hypophonia. There is no lip, neck/head, jaw or voice tremor. Neck is supple with full range of passive and  active motion. There are no carotid bruits on auscultation. Oropharynx exam reveals: mild to moderate mouth dryness, adequate dental hygiene and no sinister airway crowding, but  she does appear to have a smaller airway entry, tonsils are in place, neck circumference of 14-3/4 inches. Mallampati is class II. Tongue protrudes centrally and palate elevates symmetrically.    Chest: Clear to auscultation without wheezing, rhonchi or crackles noted.  Heart: S1+S2+0, regular and normal without murmurs, rubs or gallops noted.   Abdomen: Soft, non-tender and non-distended with normal bowel sounds appreciated on auscultation.  Extremities: There is no pitting edema in Laura ankles bilaterally, right a little bit more than left. She has scars from Laura knee surgery.   Skin: Warm and dry without trophic changes noted. There are no varicose veins, except for a few prominent appearing veins.  Musculoskeletal: exam reveals no obvious joint deformities, tenderness or joint swelling or erythema, except for mild arthritic changes in both hands.   Neurologically:  Mental status: Laura [name] is awake, alert and oriented in all 4 spheres. Her memory, attention, language and knowledge are appropriate. There is no aphasia, agnosia, apraxia or anomia. Speech is clear with normal prosody and enunciation. Thought process is linear. Mood is congruent and affect is flat.  Cranial nerves are as described above under HEENT exam. In addition, shoulder shrug is normal with equal shoulder height noted. Motor exam: Normal bulk, strength and tone is noted. There is no drift, tremor or rebound. Romberg is negative. Reflexes are 2+ to 3 + throughout, including ankles. Fine motor skills are intact with normal finger taps, normal hand movements, normal rapid alternating patting, normal foot taps and normal foot agility.  Cerebellar testing shows no dysmetria or intention tremor on finger to nose testing. Sensory exam is stable from last time. Gait, station and balance: she stands up slowly. She walks slowly and cautiously.    Assessment and Plan:   In summary, Laura [name] is a very pleasant 60 year old  female with an underlying medical history of hypertension, reflux disease, mood disorder, insomnia, s/p R knee arthroscopic surgery and L shoulder arthroscopic surgery on 01/29/2014, who has a history of burning foot pain and tingling as well as some restless leg symptoms (with Laura latter not being a big player at this time). Her neurological exam is stable. She has a family history of PN. She has been on gabapentin for symptomatic relief, but has had some sedation from it. She had 2 separate evaluations and second opinion for neuropathy and thankfully her workup has been negative for peripheral neuropathy. She is reassured in that regard. She recently saw Dr. Jannifer Franklin for Laura same issue and was advised that she did not have a history or symptoms, in keeping with peripheral neuropathy. She presents with new complaints today. She feels that her memory is not as good. She also reports recurrent headaches, particularly morning headaches and not sleeping well. She is off of Seroquel now and has been started on trazodone by her psychiatrist, 100 mg at night. In addition, she reports snoring, waking up with a snort or gasping at times. In Laura past couple of years, her workup for neuropathy has been fairly unremarkable. She had EMG and NCV testing in 7/14, skin Bx  under Dr. Krista Blue and C spine MRI showed post surgical changes.  We did extensive blood work as well. She may have a small fiber neuropathy, but with unclear etiology at this time and her Sx and exam have been  fairly stable over Laura past 2 years, so all in all, unlikely presentation for PN. She is advised to continue follow-up with her podiatrist for her put pain.  She had repeat EMG and nerve conduction testing on 02/14/2015 which showed benign findings and unchanged results from before. She had a lumbar spine MRI without contrast on 03/17/2015, which showed borderline right foraminal narrowing at L5-S1 due to mildly progressive spondylosis and degenerative disc  disease. There was degenerative disc disease at Laura other lumbar levels, without impingement. This juncture, I suggested we proceed with sleep study testing and a brain MRI without contrast. We will call her with her test results and I will see her back after Laura tests are completed. I answered all her questions today and she was in agreement.  I spent 25 minutes in total face-to-face time with Laura [name], more than 50% of which was spent in counseling and coordination of care, reviewing test results, reviewing medication and discussing or reviewing Laura diagnosis of neuropathy, its prognosis and treatment options.

## 2015-06-17 ENCOUNTER — Telehealth: Payer: Self-pay | Admitting: *Deleted

## 2015-06-17 NOTE — Telephone Encounter (Signed)
The correct medical records faxed to [facility] on 06/17/15.

## 2015-06-26 ENCOUNTER — Ambulatory Visit (HOSPITAL_COMMUNITY): Admission: RE | Admit: 2015-06-26 | Payer: BLUE CROSS/BLUE SHIELD | Source: Ambulatory Visit

## 2015-07-03 ENCOUNTER — Ambulatory Visit (HOSPITAL_COMMUNITY)
Admission: RE | Admit: 2015-07-03 | Discharge: 2015-07-03 | Disposition: A | Discharge status: Home / Self Care | Payer: BLUE CROSS/BLUE SHIELD | Source: Ambulatory Visit | Attending: Neurology | Admitting: Neurology

## 2015-07-03 DIAGNOSIS — E669 Obesity, unspecified: Secondary | ICD-10-CM

## 2015-07-03 DIAGNOSIS — G4710 Hypersomnia, unspecified: Secondary | ICD-10-CM | POA: Insufficient documentation

## 2015-07-03 DIAGNOSIS — M79672 Pain in left foot: Secondary | ICD-10-CM | POA: Diagnosis not present

## 2015-07-03 DIAGNOSIS — M79671 Pain in right foot: Secondary | ICD-10-CM | POA: Diagnosis not present

## 2015-07-03 DIAGNOSIS — R202 Paresthesia of skin: Secondary | ICD-10-CM | POA: Diagnosis present

## 2015-07-03 DIAGNOSIS — R413 Other amnesia: Secondary | ICD-10-CM | POA: Diagnosis present

## 2015-07-03 DIAGNOSIS — R9082 White matter disease, unspecified: Secondary | ICD-10-CM | POA: Diagnosis not present

## 2015-07-03 DIAGNOSIS — R51 Headache: Secondary | ICD-10-CM | POA: Insufficient documentation

## 2015-07-03 DIAGNOSIS — R0683 Snoring: Secondary | ICD-10-CM | POA: Insufficient documentation

## 2015-07-03 DIAGNOSIS — E236 Other disorders of pituitary gland: Secondary | ICD-10-CM | POA: Insufficient documentation

## 2015-07-03 DIAGNOSIS — R519 Headache, unspecified: Secondary | ICD-10-CM

## 2015-07-07 ENCOUNTER — Encounter: Payer: Self-pay | Admitting: Gastroenterology

## 2015-07-07 ENCOUNTER — Ambulatory Visit (INDEPENDENT_AMBULATORY_CARE_PROVIDER_SITE_OTHER): Payer: BLUE CROSS/BLUE SHIELD | Admitting: Gastroenterology

## 2015-07-07 VITALS — BP 83/57 | HR 94 | Temp 98.5°F | Ht 61.0 in | Wt 157.4 lb

## 2015-07-07 DIAGNOSIS — R197 Diarrhea, unspecified: Secondary | ICD-10-CM | POA: Diagnosis not present

## 2015-07-07 DIAGNOSIS — R894 Abnormal immunological findings in specimens from other organs, systems and tissues: Secondary | ICD-10-CM | POA: Diagnosis not present

## 2015-07-07 DIAGNOSIS — K219 Gastro-esophageal reflux disease without esophagitis: Secondary | ICD-10-CM | POA: Insufficient documentation

## 2015-07-07 DIAGNOSIS — R634 Abnormal weight loss: Secondary | ICD-10-CM | POA: Diagnosis not present

## 2015-07-07 DIAGNOSIS — R768 Other specified abnormal immunological findings in serum: Secondary | ICD-10-CM | POA: Insufficient documentation

## 2015-07-07 MED ORDER — RABEPRAZOLE SODIUM 20 MG PO TBEC: 20.0000 mg | DELAYED_RELEASE_TABLET | Freq: Two times a day (BID) | ORAL | Status: DC

## 2015-07-07 NOTE — Progress Notes (Signed)
Quick Note:  Please call patient regarding the recent brain MRI: The brain scan showed a normal structure of the brain and no significant volume loss, which we call atrophy. There were changes in the deeper structures of the brain, which we call white matter changes or microvascular changes. These were reported as mild in Her case. These are tiny white spots, that occur with time and are seen in a variety of conditions, including with normal aging, chronic hypertension, chronic headaches, especially migraine HAs, chronic diabetes, chronic hyperlipidemia. These are not strokes and no mass or lesion or contrast enhancement was seen which is reassuring. Again, there were no acute findings, such as a stroke, or mass or blood products. No further action is required on this test at this time, other than re-enforcing the importance of good blood pressure control, good cholesterol control, good blood sugar control, and weight management.  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 I would say, if nothing else, can have a FU scan (even through PCP) in about 6 mo, if stable then, infrequently after.  Please remind patient to keep any upcoming appointments or tests and to call us with any interim questions, concerns, problems or updates. Thanks,  Star Age, MD, PhD    ______

## 2015-07-07 NOTE — Progress Notes (Signed)
Primary Care Physician:  Glo Herring., MD  Primary Gastroenterologist:  Garfield Cornea, MD   Chief Complaint  Patient presents with  . Diarrhea    HPI:  Laura [name] is a 60 y.o. female here At the request of Dr. Gerarda Fraction for further evaluation of diarrhea. Labs in stool studies obtained, per patient unremarkable. We have requested results.  Patient seen remotely back in 2009 for diarrhea with workup including EGD and colonoscopy. Small bowel biopsies negative for celiac disease. Random colon biopsies unremarkable. States her diarrhea resolved for years she was predominantly constipated. One year ago, woke up with blood in underwear, previous hysterectomy. Went to see GYN, unremarkable exam. She presumed it came from her rectum. For the most part she has utilize Bentyl as needed for abdominal cramping. Admits to a lot of things going on lately and has been seeing a psychiatrist. Gained 45 pounds on Seroquel, now back down off medication. For the past 3-1/2 months she has had 6-7 loose, "muddy water" stools. No blood per rectum or melena. No solid stool. Rare nocturnal diarrhea. No longer on Bentyl. Also with bad heartburn and indigestion. Increased her PPI to twice daily and add Carafate at bedtime but still with refractory symptoms. Had been on Prilosec but it quit working, most recently on pantoprazole twice a day. Denies dysphagia, vomiting.  Z-Pak 2 weeks ago. Has had multiple medication changes related to psychiatric drugs.  Patient states she has a history of hepatitis B antibody positive test, notified by [employer].. States her ex-husband had hepatitis C.    Current Outpatient Prescriptions  Medication Sig Dispense Refill  . cholecalciferol (VITAMIN D) 1000 UNITS tablet Take 1,000 Units by mouth daily.    . citalopram (CELEXA) 20 MG tablet Take 40 mg by mouth daily.   0  . diazepam (VALIUM) 5 MG tablet Take 1 tablet by mouth every 8 (eight) hours as needed.     . dicyclomine (BENTYL)  20 MG tablet Take 20 mg by mouth 3 (three) times daily as needed.     . DULoxetine (CYMBALTA) 30 MG capsule 30 mg.   0  . gabapentin (NEURONTIN) 300 MG capsule TAKE 1 CAPSULE BY MOUTH 2-3 TIMES DAILY AS DIRECTED 180 capsule 3  . Ginkgo Biloba 40 MG TABS Take by mouth.    . hydrochlorothiazide (MICROZIDE) 12.5 MG capsule Take 12.5 mg by mouth every morning.     Marland Kitchen HYDROcodone-acetaminophen (NORCO) 10-325 MG per tablet Take 1 tablet by mouth every 6 (six) hours as needed.     Marland Kitchen losartan (COZAAR) 100 MG tablet Take 100 mg by mouth every morning.     . Multiple Vitamin (MULTIVITAMIN) tablet Take 1 tablet by mouth daily.    . Omega-3 Fatty Acids (FISH OIL) 1000 MG CAPS Take 2 capsules by mouth daily.    . traZODone (DESYREL) 50 MG tablet   0  . vitamin E 400 UNIT capsule Take 400 Units by mouth daily.    . Pantoprazole 40mg  Take 1 tablet (20 mg total) by mouth 2 (two) times daily before a meal. 60 tablet 5   No current facility-administered medications for this visit.    Allergies as of 07/07/2015 - Review Complete 07/07/2015  Allergen Reaction Noted  . Aspirin Other (See Comments) 06/16/2015  . Celebrex [celecoxib] Hives 07/27/2012    Past Medical History  Diagnosis Date  . Anxiety   . OA (osteoarthritis) of knee     RIGHT  . Unspecified hereditary and idiopathic peripheral neuropathy   .  Hypertension   . RLS (restless legs syndrome)   . GERD (gastroesophageal reflux disease)   . H/O hiatal hernia   . Hepatitis B antibody positive     [facility]  . IBS (irritable bowel syndrome)   . Rotator cuff impingement syndrome of left shoulder   . Acromioclavicular joint arthritis     left shoulder  . History of melanoma excision     01-05-2014  . 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 Carpal tunnel release Right 1995  . Lumbar disc surgery  08-22-2003    LEFT  L5  --  S1  . Cholecystectomy  1996  . Carpal tunnel release  Right 1990's  . Total abdominal hysterectomy w/ bilateral salpingoophorectomy  1999  . Anterior cervical decomp/discectomy fusion  2000    C4 -- C5  . Knee arthroscopy with medial menisectomy Right 05/25/2013    Procedure: RIGHT KNEE ARTHROSCOPY WITH PARTIAL LATERAL and MEDIAL MENISECTOMY AND CHONDROPLASTY;  Surgeon: Sydnee Cabal, MD;  Location: [place];  Service: Orthopedics;  Laterality: Right;  . Mohs surgery  01-05-2014    LEFT SIDE OF FACE  . Shoulder arthroscopy with subacromial decompression Left 01/29/2014    Procedure: LEFT SHOULDER ARTHROSCOPY WITH SUBACROMIAL DECOMPRESSION/DISTAL CLAVICLE RESECTION AND DEBRIDEMENT;  Surgeon: Sydnee Cabal, MD;  Location: [facility];  Service: Orthopedics;  Laterality: Left;  . Cardiac cath      unremarkable  . Esophagogastroduodenoscopy  07/2007    Dr. Gala Romney: Small hiatal hernia, duodenal biopsy unremarkable. Fundic gland gastric polyps.  . Colonoscopy  07/2007    Dr. Gala Romney: Terminal ileum normal. Status post segmental colonic mucosal biopsy.    Family History  Problem Relation Age of Onset  . Lung cancer Mother   . HIV/AIDS Father   . Colon cancer Neg Hx   . Crohn's disease Cousin   . Breast cancer Maternal Aunt     Social History   Social History  . Marital Status: Married    Spouse Name: [name]  . Number of Children: 3  . Years of Education: GED   Occupational History  .      not employed   Social History Main Topics  . Smoking status: Former Smoker -- 0.75 packs/day for 20 years    Types: Cigarettes    Quit date: 01/26/2015  . Smokeless tobacco: Never Used  . Alcohol Use: 0.0 oz/week    0 Standard drinks or equivalent per week     Comment: VERY RARE  . Drug Use: No  . Sexual Activity: Not on file   Other Topics Concern  . Not on file   Social History Narrative   Patient lives at home with her husband [name]).    Patient is not working as time but she trying for disability.    Education 10th grade   Caffeine daily coffee and mountain dew . One can daily.   .            ROS:  General: Negative for anorexia,  fever, chills, fatigue, weakness. See history of present illness Eyes: Negative for vision changes.  ENT: Negative for hoarseness, difficulty swallowing , nasal congestion. CV: Negative for chest pain, angina, palpitations, dyspnea on exertion, peripheral edema.  Respiratory: Negative for dyspnea at rest, dyspnea on exertion, cough, sputum, wheezing.  GI: See history of present illness. GU:  Negative for dysuria, hematuria, urinary incontinence, urinary  frequency, nocturnal urination.  MS: Chronic pain Derm: Negative for rash or itching.  Neuro: Negative for weakness, abnormal sensation, seizure, frequent headaches, memory loss, confusion.  Psych: Negative for anxiety, depression, suicidal ideation, hallucinations.  Endo: Negative for unusual weight change.  Heme: Negative for bruising or bleeding. Allergy: Negative for rash or hives.    Physical Examination:  BP 83/57 mmHg  Pulse 94  Temp(Src) 98.5 F (36.9 C)  Ht 5\' 1"  (1.549 m)  Wt 157 lb 6.4 oz (71.396 kg)  BMI 29.76 kg/m2   General: Well-nourished, well-developed in no acute distress. Ambulates with cane. Head: Normocephalic, atraumatic.   Eyes: Conjunctiva pink, no icterus. Mouth: Oropharyngeal mucosa moist and pink , no lesions erythema or exudate. Neck: Supple without thyromegaly, masses, or lymphadenopathy.  Lungs: Clear to auscultation bilaterally.  Heart: Regular rate and rhythm, no murmurs rubs or gallops.  Abdomen: Bowel sounds are normal, nontender, nondistended, no hepatosplenomegaly or masses, no abdominal bruits or    hernia , no rebound or guarding.   Rectal: not performed Extremities: No lower extremity edema. No clubbing or deformities.  Neuro: Alert and oriented x 4 , grossly normal neurologically.  Skin: Warm and dry, no rash or jaundice.   Psych: Alert and  cooperative, normal mood and affect.  Labs: requested  Imaging Studies: Mr Brain Wo Contrast  07/03/2015  CLINICAL DATA:  60 year old female with left side weakness and difficulty walking. Memory complaints, morning headaches. Depression for 2 years. Initial encounter. EXAM: MRI HEAD WITHOUT CONTRAST TECHNIQUE: Multiplanar, multiecho pulse sequences of the brain and surrounding structures were obtained without intravenous contrast. COMPARISON:  Cervical spine radiographs 07/26/2013. FINDINGS: Cerebral volume is within normal limits for age. No restricted diffusion to suggest acute infarction. No midline shift, mass effect, evidence of mass lesion, ventriculomegaly, extra-axial collection or acute intracranial hemorrhage. Cervicomedullary junction within normal limits. Negative visualized cervical spine. Major intracranial vascular flow voids are preserved. The pituitary is remarkable for a small 4-6 mm area of increased T2 and decreased T1 signal at the central aspect of the gland (series 2, image 11 and series 9, image 17). No suprasellar extension or mass effect. The infundibulum appears normal. Normal noncontrast appearance of the cavernous sinus. Scattered small mostly subcortical bilateral cerebral white matter foci of T2 and FLAIR hyperintensity in a nonspecific configuration. The extent is mild for age. No cortical encephalomalacia or chronic cerebral blood products deep gray matter nuclei, brainstem, and cerebellum are normal. Visible internal auditory structures appear normal. Mastoids and paranasal sinuses are clear. Negative orbit and scalp soft tissues. Normal bone marrow signal. IMPRESSION: 1.  No acute intracranial abnormality. 2. Mild for age nonspecific cerebral white matter signal changes, most commonly due to chronic small vessel disease. Acute significance doubtful. 3. 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  Clinical significance doubtful in the absence of pituitary hormone abnormality. Electronically Signed   By: Genevie Ann M.D.   On: 07/03/2015 11:29

## 2015-07-07 NOTE — Assessment & Plan Note (Signed)
Denies previous vaccination. Likely previous exposure, with subsequent immunity. Patient's ex-husband had hepatitis C. Review recent labs. Consider further evaluation.

## 2015-07-07 NOTE — Patient Instructions (Signed)
1. Stop pantoprazole and start Aciphex twice daily before a meal. 2. We will call you after I review your records. You will likely need an upper endoscopy and colonoscopy.

## 2015-07-07 NOTE — Assessment & Plan Note (Signed)
60 year old female with several month history of change in bowel habits, significant diarrhea. Refractory GERD as well. Weight loss likely related to changes psychiatric medications, significant weight gain on Seroquel which is been discontinued. Obtain recent labs and stool studies. Switch pantoprazole AcipHex 20 mg twice a day. Patient will likely need to have a colonoscopy and upper endoscopy for further evaluation of her symptoms pending review of labs and stool test.

## 2015-07-08 ENCOUNTER — Telehealth: Payer: Self-pay

## 2015-07-08 NOTE — Telephone Encounter (Signed)
-----   Message from Star Age, MD sent at 07/07/2015 11:35 AM EDT ----- Please call patient regarding the recent brain MRI: The brain scan showed a normal structure of the brain and no significant volume loss, which we call atrophy. There were changes in the deeper structures of the brain, which we call white matter changes or microvascular changes. These were reported as mild in Her case. These are tiny white spots, that occur with time and are seen in a variety of conditions, including with normal aging, chronic hypertension, chronic headaches, especially migraine HAs, chronic diabetes, chronic hyperlipidemia. These are not strokes and no mass or lesion or contrast enhancement was seen which is reassuring. Again, there were no acute findings, such as a stroke, or mass or blood products. No further action is required on this test at this time, other than re-enforcing the importance of good blood pressure control, good cholesterol control, good blood sugar control, and weight management.  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 I would say, if nothing else, can have a FU scan (even through PCP) in about 6 mo, if stable then, infrequently after.  Please remind patient to keep any upcoming appointments or tests and to call us with any interim questions, concerns, problems or updates. Thanks,  Star Age, MD, PhD

## 2015-07-08 NOTE — Telephone Encounter (Signed)
Patient called back and would like to go over results again.  Please call.

## 2015-07-08 NOTE — Progress Notes (Signed)
cc'ed to pcp °

## 2015-07-08 NOTE — Telephone Encounter (Signed)
I spoke to patient and she is aware of results and recommendations. Patient just wanted Dr. 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 She will discuss further at next visit.

## 2015-07-09 NOTE — Telephone Encounter (Signed)
LM for patient to call back if any questions.

## 2015-07-18 ENCOUNTER — Ambulatory Visit (INDEPENDENT_AMBULATORY_CARE_PROVIDER_SITE_OTHER): Payer: BLUE CROSS/BLUE SHIELD | Admitting: Neurology

## 2015-07-18 DIAGNOSIS — G4710 Hypersomnia, unspecified: Secondary | ICD-10-CM

## 2015-07-18 DIAGNOSIS — E669 Obesity, unspecified: Secondary | ICD-10-CM

## 2015-07-18 DIAGNOSIS — R202 Paresthesia of skin: Secondary | ICD-10-CM | POA: Diagnosis not present

## 2015-07-18 DIAGNOSIS — G4734 Idiopathic sleep related nonobstructive alveolar hypoventilation: Secondary | ICD-10-CM

## 2015-07-18 DIAGNOSIS — R413 Other amnesia: Secondary | ICD-10-CM

## 2015-07-18 DIAGNOSIS — R51 Headache: Secondary | ICD-10-CM | POA: Diagnosis not present

## 2015-07-18 DIAGNOSIS — M79672 Pain in left foot: Secondary | ICD-10-CM | POA: Diagnosis not present

## 2015-07-18 DIAGNOSIS — R0683 Snoring: Secondary | ICD-10-CM | POA: Diagnosis not present

## 2015-07-18 DIAGNOSIS — M79671 Pain in right foot: Secondary | ICD-10-CM

## 2015-07-18 DIAGNOSIS — G4720 Circadian rhythm sleep disorder, unspecified type: Secondary | ICD-10-CM

## 2015-07-24 ENCOUNTER — Telehealth: Payer: Self-pay | Admitting: Neurology

## 2015-07-24 ENCOUNTER — Telehealth: Payer: Self-pay

## 2015-07-24 DIAGNOSIS — G4734 Idiopathic sleep related nonobstructive alveolar hypoventilation: Secondary | ICD-10-CM

## 2015-07-24 DIAGNOSIS — E669 Obesity, unspecified: Secondary | ICD-10-CM

## 2015-07-24 DIAGNOSIS — F1190 Opioid use, unspecified, uncomplicated: Secondary | ICD-10-CM

## 2015-07-24 DIAGNOSIS — J984 Other disorders of lung: Secondary | ICD-10-CM

## 2015-07-24 NOTE — Telephone Encounter (Signed)
Pt called and states that she thought she was going to be set up for a TCS and EGD with RMR after her labs and stool studies were done. Pt states that she is going on vacation from 06/23- 07/02.  Wants to know if LSL has reviewed labs and stool results so she can get set up for procedure. Told pt that she may have to come back in for an OV to set those up once LSL reviews everything.  Routing to LSL for review.

## 2015-07-24 NOTE — Telephone Encounter (Signed)
Routing to LL so she can speak with RMR about this tomorrow

## 2015-07-24 NOTE — Telephone Encounter (Addendum)
  Reviewed labs. Cdiff Toxin A+B negative. OK to schedule TCS/EGD with RMR in OR. Try to get done before 30 days up.  Hold bentyl 3 days before prep.  If cannot get within 30 day time period, ask Rosendo Gros if I can see patient to update H+P at no charge (due to my delay).

## 2015-07-24 NOTE — Telephone Encounter (Signed)
Patient last seen on 06/16/15, diagnostic PSG on 07/18/15.   Beverlee Nims: Please call and notify the patient that the recent sleep study did not show any significant obstructive sleep apnea. She did have lower oxygen sats during the study and would benefit from evaluation with a lung doctor. We used supplemental O2 at 1 lpm, and she may qualify for Oxygen at home. I went ahead an placed a referral to pulm. Please facilitate Hinton Dyer Cox). Please inform patient that I would like to go over the details of the study during a follow up appointment. Arrange a followup appointment. Also, route or fax report to PCP and referring MD, if other than PCP.  Once you have spoken to patient, you can close this encounter.   Thanks,  Star Age, MD, PhD [facility])

## 2015-07-24 NOTE — Telephone Encounter (Signed)
Pt is aware of results and will be returning on 07/01. States to call her to confirm date and time for procedure

## 2015-07-24 NOTE — Telephone Encounter (Signed)
Routing to Ginger.  

## 2015-07-24 NOTE — Progress Notes (Signed)
Reviewed labs dated 05/20/2015 White blood cell count 6300, hemoglobin 14.3, hematocrit 41.8, platelets 311,000, gliadin IgA/IgG both negative, TTG IGA/IgG negative, and endomesial antibody IgA negative, creatinine 0.89, total bilirubin 0.3, alkaline phosphatase 124, AST 24, ALT 28, albumin 4.1, TSH 1.190   Never got the stool studies from PCP. Please request. OK to schedule TCS/EGD with RMR in OR.  Hold bentyl 3 days before prep. See telephone note from 07/24/15.

## 2015-07-24 NOTE — Telephone Encounter (Signed)
I spoke to patient and she is aware of results and recommendations. She is willing to proceed with referral.  Laura [name]: FYI patient is going on vacation tomorrow and will not be back until 08/02/15

## 2015-07-25 NOTE — Telephone Encounter (Signed)
Discussed with Dr. Gala Romney. He agrees to proceed with colonoscopy/EGD in the OR beyond 30 days as long as she is the last case of propofol day preferably on Monday.

## 2015-07-25 NOTE — Telephone Encounter (Signed)
Routing to Ginger to schedule  

## 2015-07-28 ENCOUNTER — Other Ambulatory Visit: Payer: Self-pay

## 2015-07-28 DIAGNOSIS — K219 Gastro-esophageal reflux disease without esophagitis: Secondary | ICD-10-CM

## 2015-07-28 DIAGNOSIS — R197 Diarrhea, unspecified: Secondary | ICD-10-CM

## 2015-07-28 DIAGNOSIS — R634 Abnormal weight loss: Secondary | ICD-10-CM

## 2015-07-28 MED ORDER — PEG-KCL-NACL-NASULF-NA ASC-C 100 G PO SOLR: 1.0000 | ORAL | Status: DC

## 2015-07-28 NOTE — Telephone Encounter (Signed)
PT IS SET UP FOR 08/11/15 @ 1:30. SHE IS AWARE AND INSTRUCTIONS ARE IN THE MAIL.

## 2015-07-29 NOTE — Telephone Encounter (Signed)
referral sent

## 2015-08-01 NOTE — Patient Instructions (Signed)
Laura [name]  08/01/2015     @PREFPERIOPPHARMACY @   Your procedure is scheduled on 08/11/2015.  Report to [facility] at 73;00 A.M.  Call this number if you have problems the morning of surgery:  [phone]   Remember:  Do not eat food or drink liquids after midnight.  Take these medicines the morning of surgery with A SIP OF WATER:   CYMBALTA, NEURONTIN, MICROZIDE, NORCO, COZAAR AND ACIPHEX   Do not wear jewelry, make-up or nail polish.  Do not wear lotions, powders, or perfumes.  You may wear deoderant.  Do not shave 48 hours prior to surgery.  Men may shave face and neck.  Do not bring valuables to the hospital.  [facility] is not responsible for any belongings or valuables.  Contacts, dentures or bridgework may not be worn into surgery.  Leave your suitcase in the car.  After surgery it may be brought to your room.  For patients admitted to the hospital, discharge time will be determined by your treatment team.  Patients discharged the day of surgery will not be allowed to drive home.   Name and phone number of your driver:   FAMILY Special instructions:  N/A  Please read over the following fact sheets that you were given. Care and Recovery After Surgery   Esophagogastroduodenoscopy Esophagogastroduodenoscopy (EGD) is a procedure that is used to examine the lining of the esophagus, stomach, and first part of the small intestine (duodenum). A long, flexible, lighted tube with a camera attached (endoscope) is inserted down the throat to view these organs. This procedure is done to detect problems or abnormalities, such as inflammation, bleeding, ulcers, or growths, in order to treat them. The procedure lasts 5-20 minutes. It is usually an outpatient procedure, but it may need to be performed in a hospital in emergency cases. LET [facility] CARE PROVIDER KNOW ABOUT:  Any allergies you have.  All medicines you are taking, including vitamins, herbs, eye drops, creams, and  over-the-counter medicines.  Previous problems you or members of your family have had with the use of anesthetics.  Any blood disorders you have.  Previous surgeries you have had.  Medical conditions you have. RISKS AND COMPLICATIONS Generally, this is a safe procedure. However, problems can occur and include:  Infection.  Bleeding.  Tearing (perforation) of the esophagus, stomach, or duodenum.  Difficulty breathing or not being able to breathe.  Excessive sweating.  Spasms of the larynx.  Slowed heartbeat.  Low blood pressure. BEFORE THE PROCEDURE  Do not eat or drink anything after midnight on the night before the procedure or as directed by your health care provider.  Do not take your regular medicines before the procedure if your health care provider asks you not to. Ask your health care provider about changing or stopping those medicines.  If you wear dentures, be prepared to remove them before the procedure.  Arrange for someone to drive you home after the procedure. PROCEDURE  A numbing medicine (local anesthetic) may be sprayed in your throat for comfort and to stop you from gagging or coughing.  You will have an IV tube inserted in a vein in your hand or arm. You will receive medicines and fluids through this tube.  You will be given a medicine to relax you (sedative).  A pain reliever will be given through the IV tube.  A mouth guard may be placed in your mouth to protect your teeth and to keep you from biting on  the endoscope.  You will be asked to lie on your left side.  The endoscope will be inserted down your throat and into your esophagus, stomach, and duodenum.  Air will be put through the endoscope to allow your health care provider to clearly view the lining of your esophagus.  The lining of your esophagus, stomach, and duodenum will be examined. During the exam, your health care provider may:  Remove tissue to be examined under a microscope  (biopsy) for inflammation, infection, or other medical problems.  Remove growths.  Remove objects (foreign bodies) that are stuck.  Treat any bleeding with medicines or other devices that stop tissues from bleeding (hot cautery, clipping devices).  Widen (dilate) or stretch narrowed areas of your esophagus and stomach.  The endoscope will be withdrawn. AFTER THE PROCEDURE  You will be taken to a recovery area for observation. Your blood pressure, heart rate, breathing rate, and blood oxygen level will be monitored often until the medicines you were given have worn off.  Do not eat or drink anything until the numbing medicine has worn off and your gag reflex has returned. You may choke.  Your health care provider should be able to discuss his or her findings with you. It will take longer to discuss the test results if any biopsies were taken.   This information is not intended to replace advice given to you by your health care provider. Make sure you discuss any questions you have with your health care provider.   Document Released: 05/21/2004 Document Revised: 02/08/2014 Document Reviewed: 12/22/2011 Elsevier Interactive Patient Education 2016 [employer]. Colonoscopy A colonoscopy is an exam to look at the entire large intestine (colon). This exam can help find problems such as tumors, polyps, inflammation, and areas of bleeding. The exam takes about 1 hour.  LET [facility] CARE PROVIDER KNOW ABOUT:   Any allergies you have.  All medicines you are taking, including vitamins, herbs, eye drops, creams, and over-the-counter medicines.  Previous problems you or members of your family have had with the use of anesthetics.  Any blood disorders you have.  Previous surgeries you have had.  Medical conditions you have. RISKS AND COMPLICATIONS  Generally, this is a safe procedure. However, as with any procedure, complications can occur. Possible complications  include:  Bleeding.  Tearing or rupture of the colon wall.  Reaction to medicines given during the exam.  Infection (rare). BEFORE THE PROCEDURE   Ask your health care provider about changing or stopping your regular medicines.  You may be prescribed an oral bowel prep. This involves drinking a large amount of medicated liquid, starting the day before your procedure. The liquid will cause you to have multiple loose stools until your stool is almost clear or light green. This cleans out your colon in preparation for the procedure.  Do not eat or drink anything else once you have started the bowel prep, unless your health care provider tells you it is safe to do so.  Arrange for someone to drive you home after the procedure. PROCEDURE   You will be given medicine to help you relax (sedative).  You will lie on your side with your knees bent.  A long, flexible tube with a light and camera on the end (colonoscope) will be inserted through the rectum and into the colon. The camera sends video back to a computer screen as it moves through the colon. The colonoscope also releases carbon dioxide gas to inflate the colon. This  helps your health care provider see the area better.  During the exam, your health care provider may take a small tissue sample (biopsy) to be examined under a microscope if any abnormalities are found.  The exam is finished when the entire colon has been viewed. AFTER THE PROCEDURE   Do not drive for 24 hours after the exam.  You may have a small amount of blood in your stool.  You may pass moderate amounts of gas and have mild abdominal cramping or bloating. This is caused by the gas used to inflate your colon during the exam.  Ask when your test results will be ready and how you will get your results. Make sure you get your test results.   This information is not intended to replace advice given to you by your health care provider. Make sure you discuss any  questions you have with your health care provider.   Document Released: 01/16/2000 Document Revised: 11/08/2012 Document Reviewed: 09/25/2012 Elsevier Interactive Patient Education 2016 [place] Anesthesia, Adult General anesthesia is a sleep-like state of non-feeling produced by medicines (anesthetics). General anesthesia prevents you from being alert and feeling pain during a medical procedure. Your caregiver may recommend general anesthesia if your procedure:  Is long.  Is painful or uncomfortable.  Would be frightening to see or hear.  Requires you to be still.  Affects your breathing.  Causes significant blood loss. LET YOUR CAREGIVER KNOW ABOUT:  Allergies to food or medicine.  Medicines taken, including vitamins, herbs, eyedrops, over-the-counter medicines, and creams.  Use of steroids (by mouth or creams).  Previous problems with anesthetics or numbing medicines, including problems experienced by relatives.  History of bleeding problems or blood clots.  Previous surgeries and types of anesthetics received.  Possibility of pregnancy, if this applies.  Use of cigarettes, alcohol, or illegal drugs.  Any health condition(s), especially diabetes, sleep apnea, and high blood pressure. RISKS AND COMPLICATIONS General anesthesia rarely causes complications. However, if complications do occur, they can be life threatening. Complications include:  A lung infection.  A stroke.  A heart attack.  Waking up during the procedure. When this occurs, the patient may be unable to move and communicate that he or she is awake. The patient may feel severe pain. Older adults and adults with serious medical problems are more likely to have complications than adults who are young and healthy. Some complications can be prevented by answering all of your caregiver's questions thoroughly and by following all pre-procedure instructions. It is important to tell your caregiver  if any of the pre-procedure instructions, especially those related to diet, were not followed. Any food or liquid in the stomach can cause problems when you are under general anesthesia. BEFORE THE PROCEDURE  Ask your caregiver if you will have to spend the night at the hospital. If you will not have to spend the night, arrange to have an adult drive you and stay with you for 24 hours.  Follow your caregiver's instructions if you are taking dietary supplements or medicines. Your caregiver may tell you to stop taking them or to reduce your dosage.  Do not smoke for as long as possible before your procedure. If possible, stop smoking 3-6 weeks before the procedure.  Do not take new dietary supplements or medicines within 1 week of your procedure unless your caregiver approves them.  Do not eat within 8 hours of your procedure or as directed by your caregiver. Drink only clear liquids, such as  water, black coffee (without milk or cream), and fruit juices (without pulp).  Do not drink within 3 hours of your procedure or as directed by your caregiver.  You may brush your teeth on the morning of the procedure, but make sure to spit out the toothpaste and water when finished. PROCEDURE  You will receive anesthetics through a mask, through an intravenous (IV) access tube, or through both. A doctor who specializes in anesthesia (anesthesiologist) or a nurse who specializes in anesthesia (nurse anesthetist) or both will stay with you throughout the procedure to make sure you remain unconscious. He or she will also watch your blood pressure, pulse, and oxygen levels to make sure that the anesthetics do not cause any problems. Once you are asleep, a breathing tube or mask may be used to help you breathe. AFTER THE PROCEDURE You will wake up after the procedure is complete. You may be in the room where the procedure was performed or in a recovery area. You may have a sore throat if a breathing tube was used.  You may also feel:  Dizzy.  Weak.  Drowsy.  Confused.  Nauseous.  Cold. These are all normal responses and can be expected to last for up to 24 hours after the procedure is complete. A caregiver will tell you when you are ready to go home. This will usually be when you are fully awake and in stable condition.   This information is not intended to replace advice given to you by your health care provider. Make sure you discuss any questions you have with your health care provider.   Document Released: 04/27/2007 Document Revised: 02/08/2014 Document Reviewed: 05/19/2011 Elsevier Interactive Patient Education [employer].

## 2015-08-04 ENCOUNTER — Encounter (HOSPITAL_COMMUNITY): Payer: Self-pay

## 2015-08-04 ENCOUNTER — Other Ambulatory Visit: Payer: Self-pay

## 2015-08-04 ENCOUNTER — Encounter (HOSPITAL_COMMUNITY)
Admission: RE | Admit: 2015-08-04 | Discharge: 2015-08-04 | Disposition: A | Discharge status: Home / Self Care | Payer: BLUE CROSS/BLUE SHIELD | Source: Ambulatory Visit | Attending: Internal Medicine | Admitting: Internal Medicine

## 2015-08-04 DIAGNOSIS — Z01812 Encounter for preprocedural laboratory examination: Secondary | ICD-10-CM | POA: Diagnosis present

## 2015-08-04 DIAGNOSIS — Z01810 Encounter for preprocedural cardiovascular examination: Secondary | ICD-10-CM | POA: Diagnosis not present

## 2015-08-04 HISTORY — DX: Idiopathic sleep related nonobstructive alveolar hypoventilation: G47.34

## 2015-08-04 HISTORY — DX: Reserved for inherently not codable concepts without codable children: IMO0001

## 2015-08-04 LAB — BASIC METABOLIC PANEL
ANION GAP: 11 (ref 5–15)
BUN: 20 mg/dL (ref 6–20)
CO2: 23 mmol/L (ref 22–32)
Calcium: 9.1 mg/dL (ref 8.9–10.3)
Chloride: 104 mmol/L (ref 101–111)
Creatinine, Ser: 0.88 mg/dL (ref 0.44–1.00)
GLUCOSE: 155 mg/dL — AB (ref 65–99)
POTASSIUM: 3.6 mmol/L (ref 3.5–5.1)
Sodium: 138 mmol/L (ref 135–145)

## 2015-08-04 LAB — CBC
HCT: 42.4 % (ref 36.0–46.0)
Hemoglobin: 14.6 g/dL (ref 12.0–15.0)
MCH: 31.4 pg (ref 26.0–34.0)
MCHC: 34.4 g/dL (ref 30.0–36.0)
MCV: 91.2 fL (ref 78.0–100.0)
PLATELETS: 277 10*3/uL (ref 150–400)
RBC: 4.65 MIL/uL (ref 3.87–5.11)
RDW: 12.7 % (ref 11.5–15.5)
WBC: 5.9 10*3/uL (ref 4.0–10.5)

## 2015-08-07 ENCOUNTER — Ambulatory Visit (INDEPENDENT_AMBULATORY_CARE_PROVIDER_SITE_OTHER)
Admission: RE | Admit: 2015-08-07 | Discharge: 2015-08-07 | Disposition: A | Discharge status: Home / Self Care | Payer: BLUE CROSS/BLUE SHIELD | Source: Ambulatory Visit | Attending: Internal Medicine | Admitting: Internal Medicine

## 2015-08-07 ENCOUNTER — Other Ambulatory Visit (INDEPENDENT_AMBULATORY_CARE_PROVIDER_SITE_OTHER): Payer: BLUE CROSS/BLUE SHIELD

## 2015-08-07 ENCOUNTER — Encounter: Payer: Self-pay | Admitting: Internal Medicine

## 2015-08-07 ENCOUNTER — Ambulatory Visit (INDEPENDENT_AMBULATORY_CARE_PROVIDER_SITE_OTHER): Payer: BLUE CROSS/BLUE SHIELD | Admitting: Internal Medicine

## 2015-08-07 ENCOUNTER — Telehealth: Payer: Self-pay

## 2015-08-07 VITALS — BP 132/80 | HR 96 | Ht 61.5 in | Wt 161.0 lb

## 2015-08-07 DIAGNOSIS — J9611 Chronic respiratory failure with hypoxia: Secondary | ICD-10-CM | POA: Diagnosis not present

## 2015-08-07 DIAGNOSIS — R058 Other specified cough: Secondary | ICD-10-CM | POA: Insufficient documentation

## 2015-08-07 DIAGNOSIS — R05 Cough: Secondary | ICD-10-CM

## 2015-08-07 LAB — CBC WITH DIFFERENTIAL/PLATELET
BASOS ABS: 0.1 10*3/uL (ref 0.0–0.1)
Basophils Relative: 0.7 % (ref 0.0–3.0)
Eosinophils Absolute: 0.1 10*3/uL (ref 0.0–0.7)
Eosinophils Relative: 1.5 % (ref 0.0–5.0)
HCT: 42.3 % (ref 36.0–46.0)
Hemoglobin: 14.6 g/dL (ref 12.0–15.0)
LYMPHS ABS: 3.2 10*3/uL (ref 0.7–4.0)
Lymphocytes Relative: 40.4 % (ref 12.0–46.0)
MCHC: 34.6 g/dL (ref 30.0–36.0)
MCV: 90.3 fl (ref 78.0–100.0)
MONO ABS: 0.6 10*3/uL (ref 0.1–1.0)
MONOS PCT: 7 % (ref 3.0–12.0)
NEUTROS ABS: 4 10*3/uL (ref 1.4–7.7)
NEUTROS PCT: 50.4 % (ref 43.0–77.0)
PLATELETS: 304 10*3/uL (ref 150.0–400.0)
RBC: 4.68 Mil/uL (ref 3.87–5.11)
RDW: 13.2 % (ref 11.5–15.5)
WBC: 7.9 10*3/uL (ref 4.0–10.5)

## 2015-08-07 MED ORDER — BENZONATATE 200 MG PO CAPS: ORAL_CAPSULE | ORAL | Status: DC

## 2015-08-07 NOTE — Progress Notes (Signed)
Quick Note:  Spoke with pt and notified of results per Dr. Wert. Pt verbalized understanding and denied any questions.  ______ 

## 2015-08-07 NOTE — Assessment & Plan Note (Addendum)
Allergy profile 08/07/2015 >  Eos 0.1/  IgE  pending    Classic Upper airway cough syndrome, so named because it's frequently impossible to sort out how much is  CR/sinusitis with freq throat clearing (which can be related to primary GERD)   vs  causing  secondary (" extra esophageal")  GERD from wide swings in gastric pressure that occur with throat clearing, often  promoting self use of mint and menthol lozenges that reduce the lower esophageal sphincter tone and exacerbate the problem further in a cyclical fashion.   These are the same pts (now being labeled as having "irritable larynx syndrome" by some cough centers) who not infrequently have a history of having failed to tolerate ace inhibitors(as is the case here) ,  dry powder inhalers or biphosphonates or report having atypical reflux symptoms that don't respond to standard doses of PPI , and are easily confused as having aecopd or asthma flares by even experienced allergists/ pulmonologists.   First step is eliminated cyclical coughing with non sedating meds eg tessalon and max rx for gerd with acid suppression/ diet and 1st gen H1 as tol then regroup in 6 weeks with pfts to sort out ? Airways dz

## 2015-08-07 NOTE — Assessment & Plan Note (Signed)
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  Will repeat ono RA and proceed with pfts then regroup re ? Need for rx with 02 or bronchodilators, in meantime should min sedating meds

## 2015-08-07 NOTE — Progress Notes (Signed)
Subjective:    Patient ID: Laura [name], female    DOB: 1955-08-11, 60 y.o.   MRN: [id number]  HPI  52 yowf quit smoking 01/2015 with remote h/o acei cough but this had resolved off acei and while still on cigs with onset of new cough early June 2017 referred to pulmonary clinic 08/07/2015 by Dr Frances Furbish with this complaint and also desats on Sleep study 07/18/15     08/07/2015 1st [facility] office visit/ Saleem Coccia   Chief Complaint  Patient presents with  . Pulmonary Consult    Referred by Dr. Huston Foley for nocturnal hypoxia. Pt c/o cough x 3 wks and SOB "for a while" "I just run out of air".   had been seeing neuro for neuropathy and underwent sleep study with h/o snoring and desaturated during the sleep study done at [facility] sleep  07/18/15   and neg sleep apnea/ pos for hypoxemia x 51 min.  Cough was not present at GI ov 07/07/15 while protonix  40 mg bid but after change to aciphex bid for diarrhea so back to protonix but not ac x one week s benefit  Cough is worse after  30 min - 1 hours at hs Coughs so hard vomits and loses her breath Takes clariton for pnds x forever s benefit for sneezing Really mostly sob when coughing, not really limited from desired activities    No obvious other patterns in day to day or daytime variabilty or assoc cp or chest tightness, subjective wheeze . No unusual exp hx or h/o childhood pna/ asthma or knowledge of premature birth.  Sleeping ok now without nocturnal  or early am exacerbation  of respiratory  c/o's or need for noct saba. Also denies any obvious fluctuation of symptoms with weather or environmental changes or other aggravating or alleviating factors except as outlined above   Current Medications, Allergies, Complete Past Medical History, Past Surgical History, Family History, and Social History were reviewed in [employer] record.       Review of Systems  Constitutional: Negative for fever, chills and unexpected  weight change.  HENT: Positive for sneezing. Negative for congestion, dental problem, ear pain, nosebleeds, postnasal drip, rhinorrhea, sinus pressure, sore throat, trouble swallowing and voice change.   Eyes: Negative for visual disturbance.  Respiratory: Positive for cough and shortness of breath. Negative for choking.   Cardiovascular: Negative for chest pain and leg swelling.  Gastrointestinal: Positive for abdominal pain. Negative for vomiting and diarrhea.       Acid heartburn Indigestion  Genitourinary: Negative for difficulty urinating.  Musculoskeletal: Negative for arthralgias.  Skin: Negative for rash.  Neurological: Positive for headaches. Negative for tremors and syncope.  Hematological: Does not bruise/bleed easily.       Objective:   Physical Exam  amb wf nad  Wt Readings from Last 3 Encounters:  08/07/15 161 lb (73.029 kg)  08/04/15 157 lb (71.215 kg)  07/07/15 157 lb 6.4 oz (71.396 kg)    Vital signs reviewed - note sats 97% RA at rest   HEENT: nl dentition, turbinates, and oropharynx. Nl external ear canals without cough reflex   NECK :  without JVD/Nodes/TM/ nl carotid upstrokes bilaterally   LUNGS: no acc muscle use,  Nl contour chest which is clear to A and P bilaterally without cough on insp or exp maneuvers   CV:  RRR  no s3 or murmur or increase in P2, no edema   ABD:  soft and nontender with  nl inspiratory excursion in the supine position. No bruits or organomegaly, bowel sounds nl  MS:  Nl gait/ ext warm without deformities, calf tenderness, cyanosis or clubbing No obvious joint restrictions   SKIN: warm and dry without lesions    NEURO:  alert, approp, nl sensorium with  no motor deficits     CXR PA and Lateral:   08/07/2015 :    I personally reviewed images and agree with radiology impression as follows:    Mild interstitial prominence likely reflects the patient's smoking history. No pneumonia, CHF, nor other acute  cardiopulmonary abnormality       Assessment & Plan:

## 2015-08-07 NOTE — Patient Instructions (Addendum)
Please see patient coordinator before you leave today  to schedule for Overnight 02 sats on RA  For cough try tessalon 200 mg every  4 hours as needed  Stop clariton and For drainage / throat tickle try take CHLORPHENIRAMINE  4 mg - take one every 4 hours as needed - available over the counter- may cause drowsiness so start with just a bedtime dose or two and see how you tolerate it before trying in daytime    Protonix Take 30- 60 min before your first and last meals of the day   GERD (REFLUX)  is an extremely common cause of respiratory symptoms just like yours , many times with no obvious heartburn at all.    It can be treated with medication, but also with lifestyle changes including elevation of the head of your bed (ideally with 6 inch  bed blocks),  Smoking cessation, avoidance of late meals, excessive alcohol, and avoid fatty foods, chocolate, peppermint, colas, red wine, and acidic juices such as orange juice.  NO MINT OR MENTHOL PRODUCTS SO NO COUGH DROPS  USE SUGARLESS CANDY INSTEAD (Jolley ranchers or Stover's or Life Savers) or even ice chips will also do - the key is to swallow to prevent all throat clearing. NO OIL BASED VITAMINS - use powdered substitutes.    Please schedule a follow up office visit in 6 weeks, call sooner if needed with pfts ok to push back if needed

## 2015-08-07 NOTE — Telephone Encounter (Signed)
LMOM to call back about changing the time for her TCS

## 2015-08-08 ENCOUNTER — Other Ambulatory Visit: Payer: Self-pay

## 2015-08-08 LAB — RESPIRATORY ALLERGY PROFILE REGION II ~~LOC~~
Allergen, D pternoyssinus,d7: <0.1 kU/L
Allergen, Mulberry, t76: <0.1 kU/L
Alternaria Alternata: <0.1 kU/L
Box Elder IgE: <0.1 kU/L
Cat Dander: <0.1 kU/L
Cladosporium Herbarum: <0.1 kU/L
Cockroach: <0.1 kU/L
D. farinae: <0.1 kU/L
Dog Dander: <0.1 kU/L
Elm IgE: <0.1 kU/L
IGE (IMMUNOGLOBULIN E), SERUM: 19 kU/L (ref <115)
Johnson Grass: <0.1 kU/L
Pecan/Hickory Tree IgE: <0.1 kU/L
Sheep Sorrel IgE: <0.1 kU/L
Timothy Grass: <0.1 kU/L

## 2015-08-08 NOTE — Progress Notes (Signed)
Quick Note:  Spoke with pt and notified of results per Dr. Wert. Pt verbalized understanding and denied any questions.  ______ 

## 2015-08-11 ENCOUNTER — Encounter (HOSPITAL_COMMUNITY): Payer: Self-pay

## 2015-08-11 ENCOUNTER — Ambulatory Visit (HOSPITAL_COMMUNITY): Payer: BLUE CROSS/BLUE SHIELD | Admitting: Anesthesiology

## 2015-08-11 ENCOUNTER — Ambulatory Visit (HOSPITAL_COMMUNITY)
Admission: RE | Admit: 2015-08-11 | Discharge: 2015-08-11 | Disposition: A | Discharge status: Home / Self Care | Payer: BLUE CROSS/BLUE SHIELD | Source: Ambulatory Visit | Attending: Internal Medicine | Admitting: Internal Medicine

## 2015-08-11 ENCOUNTER — Encounter (HOSPITAL_COMMUNITY)
Admission: RE | Disposition: A | Discharge status: Home / Self Care | Payer: Self-pay | Source: Ambulatory Visit | Attending: Internal Medicine

## 2015-08-11 DIAGNOSIS — Z85828 Personal history of other malignant neoplasm of skin: Secondary | ICD-10-CM | POA: Diagnosis not present

## 2015-08-11 DIAGNOSIS — Z79899 Other long term (current) drug therapy: Secondary | ICD-10-CM | POA: Diagnosis not present

## 2015-08-11 DIAGNOSIS — K209 Esophagitis, unspecified: Secondary | ICD-10-CM | POA: Diagnosis not present

## 2015-08-11 DIAGNOSIS — Z888 Allergy status to other drugs, medicaments and biological substances status: Secondary | ICD-10-CM | POA: Insufficient documentation

## 2015-08-11 DIAGNOSIS — K317 Polyp of stomach and duodenum: Secondary | ICD-10-CM | POA: Diagnosis not present

## 2015-08-11 DIAGNOSIS — Z801 Family history of malignant neoplasm of trachea, bronchus and lung: Secondary | ICD-10-CM | POA: Diagnosis not present

## 2015-08-11 DIAGNOSIS — Z8379 Family history of other diseases of the digestive system: Secondary | ICD-10-CM | POA: Diagnosis not present

## 2015-08-11 DIAGNOSIS — K2100 Gastro-esophageal reflux disease with esophagitis, without bleeding: Secondary | ICD-10-CM | POA: Insufficient documentation

## 2015-08-11 DIAGNOSIS — Z9049 Acquired absence of other specified parts of digestive tract: Secondary | ICD-10-CM | POA: Insufficient documentation

## 2015-08-11 DIAGNOSIS — Z87891 Personal history of nicotine dependence: Secondary | ICD-10-CM | POA: Insufficient documentation

## 2015-08-11 DIAGNOSIS — Z832 Family history of diseases of the blood and blood-forming organs and certain disorders involving the immune mechanism: Secondary | ICD-10-CM | POA: Insufficient documentation

## 2015-08-11 DIAGNOSIS — Z886 Allergy status to analgesic agent status: Secondary | ICD-10-CM | POA: Insufficient documentation

## 2015-08-11 DIAGNOSIS — G609 Hereditary and idiopathic neuropathy, unspecified: Secondary | ICD-10-CM | POA: Diagnosis not present

## 2015-08-11 DIAGNOSIS — M1711 Unilateral primary osteoarthritis, right knee: Secondary | ICD-10-CM | POA: Diagnosis not present

## 2015-08-11 DIAGNOSIS — K449 Diaphragmatic hernia without obstruction or gangrene: Secondary | ICD-10-CM | POA: Diagnosis not present

## 2015-08-11 DIAGNOSIS — R0602 Shortness of breath: Secondary | ICD-10-CM | POA: Insufficient documentation

## 2015-08-11 DIAGNOSIS — Z90710 Acquired absence of both cervix and uterus: Secondary | ICD-10-CM | POA: Diagnosis not present

## 2015-08-11 DIAGNOSIS — K589 Irritable bowel syndrome without diarrhea: Secondary | ICD-10-CM | POA: Insufficient documentation

## 2015-08-11 DIAGNOSIS — G2581 Restless legs syndrome: Secondary | ICD-10-CM | POA: Insufficient documentation

## 2015-08-11 DIAGNOSIS — K529 Noninfective gastroenteritis and colitis, unspecified: Secondary | ICD-10-CM | POA: Insufficient documentation

## 2015-08-11 DIAGNOSIS — Z981 Arthrodesis status: Secondary | ICD-10-CM | POA: Diagnosis not present

## 2015-08-11 DIAGNOSIS — G4730 Sleep apnea, unspecified: Secondary | ICD-10-CM | POA: Insufficient documentation

## 2015-08-11 DIAGNOSIS — F419 Anxiety disorder, unspecified: Secondary | ICD-10-CM | POA: Insufficient documentation

## 2015-08-11 DIAGNOSIS — Z803 Family history of malignant neoplasm of breast: Secondary | ICD-10-CM | POA: Insufficient documentation

## 2015-08-11 DIAGNOSIS — K219 Gastro-esophageal reflux disease without esophagitis: Secondary | ICD-10-CM | POA: Diagnosis not present

## 2015-08-11 DIAGNOSIS — R12 Heartburn: Secondary | ICD-10-CM | POA: Diagnosis present

## 2015-08-11 HISTORY — PX: COLONOSCOPY WITH PROPOFOL: SHX5780

## 2015-08-11 HISTORY — PX: ESOPHAGOGASTRODUODENOSCOPY (EGD) WITH PROPOFOL: SHX5813

## 2015-08-11 HISTORY — PX: BIOPSY: SHX5522

## 2015-08-11 LAB — GASTROINTESTINAL PANEL BY PCR, STOOL (REPLACES STOOL CULTURE)
ASTROVIRUS: NOT DETECTED
Adenovirus F40/41: NOT DETECTED
CAMPYLOBACTER SPECIES: NOT DETECTED
Cryptosporidium: NOT DETECTED
Cyclospora cayetanensis: NOT DETECTED
E. COLI O157: NOT DETECTED
ENTEROTOXIGENIC E COLI (ETEC): NOT DETECTED
Entamoeba histolytica: NOT DETECTED
Enteroaggregative E coli (EAEC): NOT DETECTED
Enteropathogenic E coli (EPEC): NOT DETECTED
Giardia lamblia: NOT DETECTED
NOROVIRUS GI/GII: NOT DETECTED
PLESIMONAS SHIGELLOIDES: NOT DETECTED
ROTAVIRUS A: NOT DETECTED
SAPOVIRUS (I, II, IV, AND V): NOT DETECTED
SHIGA LIKE TOXIN PRODUCING E COLI (STEC): NOT DETECTED
Salmonella species: NOT DETECTED
Shigella/Enteroinvasive E coli (EIEC): NOT DETECTED
VIBRIO CHOLERAE: NOT DETECTED
Vibrio species: NOT DETECTED
Yersinia enterocolitica: NOT DETECTED

## 2015-08-11 LAB — CLOSTRIDIUM DIFFICILE BY PCR: Toxigenic C. Difficile by PCR: NEGATIVE

## 2015-08-11 SURGERY — COLONOSCOPY WITH PROPOFOL
Anesthesia: Monitor Anesthesia Care

## 2015-08-11 MED ORDER — FENTANYL CITRATE (PF) 100 MCG/2ML IJ SOLN: 25.0000 ug | INTRAMUSCULAR | Status: DC | PRN

## 2015-08-11 MED ORDER — MIDAZOLAM HCL 2 MG/2ML IJ SOLN
INTRAMUSCULAR | Status: AC
  Filled 2015-08-11: qty 2

## 2015-08-11 MED ORDER — MIDAZOLAM HCL 2 MG/2ML IJ SOLN
1.0000 mg | INTRAMUSCULAR | Status: DC | PRN
  Administered 2015-08-11: 2 mg via INTRAVENOUS

## 2015-08-11 MED ORDER — ONDANSETRON HCL 4 MG/2ML IJ SOLN: 4.0000 mg | Freq: Once | INTRAMUSCULAR | Status: DC | PRN

## 2015-08-11 MED ORDER — FENTANYL CITRATE (PF) 100 MCG/2ML IJ SOLN
INTRAMUSCULAR | Status: AC
  Filled 2015-08-11: qty 2

## 2015-08-11 MED ORDER — MIDAZOLAM HCL 5 MG/5ML IJ SOLN
INTRAMUSCULAR | Status: DC | PRN
  Administered 2015-08-11: 2 mg via INTRAVENOUS

## 2015-08-11 MED ORDER — PROPOFOL 10 MG/ML IV BOLUS
INTRAVENOUS | Status: DC | PRN
  Administered 2015-08-11: 10 mg via INTRAVENOUS

## 2015-08-11 MED ORDER — FENTANYL CITRATE (PF) 100 MCG/2ML IJ SOLN
25.0000 ug | INTRAMUSCULAR | Status: AC | PRN
  Administered 2015-08-11 (×2): 25 ug via INTRAVENOUS

## 2015-08-11 MED ORDER — LIDOCAINE HCL (CARDIAC) 10 MG/ML IV SOLN
INTRAVENOUS | Status: DC | PRN
  Administered 2015-08-11: 50 mg via INTRAVENOUS

## 2015-08-11 MED ORDER — LIDOCAINE VISCOUS 2 % MT SOLN: 15.0000 mL | Freq: Once | OROMUCOSAL | Status: DC

## 2015-08-11 MED ORDER — ONDANSETRON HCL 4 MG/2ML IJ SOLN
4.0000 mg | Freq: Once | INTRAMUSCULAR | Status: AC
  Administered 2015-08-11: 4 mg via INTRAVENOUS

## 2015-08-11 MED ORDER — LACTATED RINGERS IV SOLN
INTRAVENOUS | Status: DC
  Administered 2015-08-11: 08:00:00 via INTRAVENOUS

## 2015-08-11 MED ORDER — ONDANSETRON HCL 4 MG/2ML IJ SOLN
INTRAMUSCULAR | Status: AC
  Filled 2015-08-11: qty 2

## 2015-08-11 MED ORDER — LIDOCAINE HCL (PF) 1 % IJ SOLN
INTRAMUSCULAR | Status: AC
  Filled 2015-08-11: qty 5

## 2015-08-11 MED ORDER — LIDOCAINE VISCOUS 2 % MT SOLN
OROMUCOSAL | Status: AC
  Filled 2015-08-11: qty 15

## 2015-08-11 MED ORDER — PROPOFOL 10 MG/ML IV BOLUS
INTRAVENOUS | Status: AC
  Filled 2015-08-11: qty 40

## 2015-08-11 MED ORDER — PROPOFOL 500 MG/50ML IV EMUL
INTRAVENOUS | Status: DC | PRN
  Administered 2015-08-11: 75 ug/kg/min via INTRAVENOUS

## 2015-08-11 NOTE — H&P (View-Only) (Signed)
Reviewed labs dated 05/20/2015 White blood cell count 6300, hemoglobin 14.3, hematocrit 41.8, platelets 311,000, gliadin IgA/IgG both negative, TTG IGA/IgG negative, and endomesial antibody IgA negative, creatinine 0.89, total bilirubin 0.3, alkaline phosphatase 124, AST 24, ALT 28, albumin 4.1, TSH 1.190   Never got the stool studies from PCP. Please request. OK to schedule TCS/EGD with RMR in OR.  Hold bentyl 3 days before prep. See telephone note from 07/24/15.

## 2015-08-11 NOTE — Anesthesia Postprocedure Evaluation (Signed)
Anesthesia Post Note  Patient: Laura [name]  Procedure(s) Performed: Procedure(s) (LRB): COLONOSCOPY WITH PROPOFOL (N/A) ESOPHAGOGASTRODUODENOSCOPY (EGD) WITH PROPOFOL (N/A) BIOPSY  Patient location during evaluation: PACU Anesthesia Type: MAC Level of consciousness: awake Pain management: satisfactory to patient Vital Signs Assessment: post-procedure vital signs reviewed and stable Respiratory status: spontaneous breathing Cardiovascular status: stable Anesthetic complications: no    Last Vitals:  Filed Vitals:   08/11/15 0945 08/11/15 1000  BP:  103/42  Pulse: 67 75  Temp:  36.4 C  Resp: 14 16    Last Pain:  Filed Vitals:   08/11/15 1001  PainSc: 0-No pain                 Kinslei Labine

## 2015-08-11 NOTE — Telephone Encounter (Signed)
Pt was aware that the time was changed for today

## 2015-08-11 NOTE — Op Note (Signed)
[facility] Patient Name: Laura [name] Procedure Date: 08/11/2015 8:29 AM MRN: [id number] Date of Birth: 10/18/1955 Attending MD: Gennette Pac , MD CSN: [id number] Age: 60 Admit Type: Outpatient Procedure:                Upper GI endoscopy with gastric biopsy Indications:              Heartburn, Suspected reflux esophagitis Providers:                Gennette Pac, MD, Nena Polio, RN, Lollie Marrow.                            Val Eagle, Technician Referring MD:              Medicines:                Monitored Anesthesia Care Complications:            No immediate complications. Estimated Blood Loss:     Estimated blood loss was minimal. Procedure:                Pre-Anesthesia Assessment:                           - Prior to the procedure, a History and Physical                            was performed, and patient medications and                            allergies were reviewed. The patient's tolerance of                            previous anesthesia was also reviewed. The risks                            and benefits of the procedure and the sedation                            options and risks were discussed with the patient.                            All questions were answered, and informed consent                            was obtained. Prior Anticoagulants: The patient has                            taken no previous anticoagulant or antiplatelet                            agents. ASA Grade Assessment: II - A patient with                            mild systemic disease. After reviewing the risks  and benefits, the patient was deemed in                            satisfactory condition to undergo the procedure.                           After obtaining informed consent, the endoscope was                            passed under direct vision. Throughout the                            procedure, the patient's blood pressure, pulse, and                           oxygen saturations were monitored continuously. The                            [id number] ([id number]) scope was introduced through the                            mouth, and advanced to the second part of duodenum.                            The upper GI endoscopy was accomplished without                            difficulty. The patient tolerated the procedure                            well. Scope In: 8:37:10 AM Scope Out: 8:40:52 AM Total Procedure Duration: 0 hours 3 minutes 42 seconds  Findings:      LA Grade A (one or more mucosal breaks less than 5 mm, not extending       between tops of 2 mucosal folds) esophagitis was found 35 to 36 cm from       the incisors. no Barrett's epithelium seen.      A small hiatal hernia was present. This was biopsied with a cold forceps       for histology.      A few 4 mm pedunculated and sessile polyps were found in the stomach.       This was biopsied with a cold forceps for histology. Estimated blood       loss was minimal.      The second portion of the duodenum was normal. Impression:               - LA Grade A esophagitis.                           - Small hiatal hernia. - A few gastric polyps.                            Biopsied.                           -  Normal second portion of the duodenum. Moderate Sedation:      Moderate (conscious) sedation was personally administered by an       anesthesia professional. The following parameters were monitored: oxygen       saturation, heart rate, blood pressure, respiratory rate, EKG, adequacy       of pulmonary ventilation, and response to care. Total physician       intraservice time was 13 minutes. Recommendation:           - Patient has a contact number available for                            emergencies. The signs and symptoms of potential                            delayed complications were discussed with the                            patient. Return to normal  activities tomorrow.                            Written discharge instructions were provided to the                            patient.                           - Advance diet as tolerated.                           - Continue present medications. Resume AcipHex 20                            mg twice daily. If cough recurs, stop this                            medication and call our office.                           - Await pathology results.                           - No repeat upper endoscopy.                           - Return to GI office in 2 months. Procedure Code(s):        --- Professional ---                           [phone], Esophagogastroduodenoscopy, flexible,                            transoral; with biopsy, single or multiple Diagnosis Code(s):        --- Professional ---                           K20.9, Esophagitis, unspecified  K44.9, Diaphragmatic hernia without obstruction or                            gangrene                           K31.7, Polyp of stomach and duodenum                           R12, Heartburn CPT copyright 2016 American [employer]. All rights reserved. The codes documented in this report are preliminary and upon coder review may  be revised to meet current compliance requirements. [name], MD Gennette Pac, MD 08/11/2015 3:27:50 PM This report has been signed electronically. Number of Addenda: 0

## 2015-08-11 NOTE — Interval H&P Note (Signed)
History and Physical Interval Note:  08/11/2015 8:22 AM  Laura [name]  has presented today for surgery, with the diagnosis of WEIGHT LOSS/GERD/DIARRHEA  The various methods of treatment have been discussed with the patient and family. After consideration of risks, benefits and other options for treatment, the patient has consented to  Procedure(s) with comments: COLONOSCOPY WITH PROPOFOL (N/A) - 130 - moved to 8:15, pt knows to arrive at 6:45 ESOPHAGOGASTRODUODENOSCOPY (EGD) WITH PROPOFOL (N/A) as a surgical intervention .  The patient's history has been reviewed, patient examined, no change in status, stable for surgery.  I have reviewed the patient's chart and labs.  Questions were answered to the patient's satisfaction.     [name]  No change. Perceived a cough with AcipHex stopped taking it. No other symptoms. No dysphagia. EGD and colonoscopy per plan.

## 2015-08-11 NOTE — Transfer of Care (Signed)
Immediate Anesthesia Transfer of Care Note  Patient: Laura [name]  Procedure(s) Performed: Procedure(s) with comments: COLONOSCOPY WITH PROPOFOL (N/A) - 130 - moved to 8:15, pt knows to arrive at 6:45 ESOPHAGOGASTRODUODENOSCOPY (EGD) WITH PROPOFOL (N/A) BIOPSY - gastric polyp;  Patient Location: PACU  Anesthesia Type:MAC  Level of Consciousness: awake and patient cooperative  Airway & Oxygen Therapy: Patient Spontanous Breathing and Patient connected to nasal cannula oxygen  Post-op Assessment: Report given to RN, Post -op Vital signs reviewed and stable and Patient moving all extremities  Post vital signs: Reviewed and stable   Last Pain: There were no vitals filed for this visit.    Patients Stated Pain Goal: 9 ([id number])  Complications: No apparent anesthesia complications

## 2015-08-11 NOTE — Discharge Instructions (Addendum)
Colonoscopy Discharge Instructions  Read the instructions outlined below and refer to this sheet in the next few weeks. These discharge instructions provide you with general information on caring for yourself after you leave the hospital. Your doctor may also give you specific instructions. While your treatment has been planned according to the most current medical practices available, unavoidable complications occasionally occur. If you have any problems or questions after discharge, call Dr. Gala Romney at [phone]. ACTIVITY  You may resume your regular activity, but move at a slower pace for the next 24 hours.   Take frequent rest periods for the next 24 hours.   Walking will help get rid of the air and reduce the bloated feeling in your belly (abdomen).   No driving for 24 hours (because of the medicine (anesthesia) used during the test).    Do not sign any important legal documents or operate any machinery for 24 hours (because of the anesthesia used during the test).  NUTRITION  Drink plenty of fluids.   You may resume your normal diet as instructed by your doctor.   Begin with a light meal and progress to your normal diet. Heavy or fried foods are harder to digest and may make you feel sick to your stomach (nauseated).   Avoid alcoholic beverages for 24 hours or as instructed.  MEDICATIONS  You may resume your normal medications unless your doctor tells you otherwise.  WHAT YOU CAN EXPECT TODAY  Some feelings of bloating in the abdomen.   Passage of more gas than usual.   Spotting of blood in your stool or on the toilet paper.  IF YOU HAD POLYPS REMOVED DURING THE COLONOSCOPY:  No aspirin products for 7 days or as instructed.   No alcohol for 7 days or as instructed.   Eat a soft diet for the next 24 hours.  FINDING OUT THE RESULTS OF YOUR TEST Not all test results are available during your visit. If your test results are not back during the visit, make an appointment  with your caregiver to find out the results. Do not assume everything is normal if you have not heard from your caregiver or the medical facility. It is important for you to follow up on all of your test results.  SEEK IMMEDIATE MEDICAL ATTENTION IF:  You have more than a spotting of blood in your stool.   Your belly is swollen (abdominal distention).   You are nauseated or vomiting.   You have a temperature over 101.   You have abdominal pain or discomfort that is severe or gets worse throughout the day.   EGD Discharge instructions Please read the instructions outlined below and refer to this sheet in the next few weeks. These discharge instructions provide you with general information on caring for yourself after you leave the hospital. Your doctor may also give you specific instructions. While your treatment has been planned according to the most current medical practices available, unavoidable complications occasionally occur. If you have any problems or questions after discharge, please call your doctor. ACTIVITY  You may resume your regular activity but move at a slower pace for the next 24 hours.   Take frequent rest periods for the next 24 hours.   Walking will help expel (get rid of) the air and reduce the bloated feeling in your abdomen.   No driving for 24 hours (because of the anesthesia (medicine) used during the test).   You may shower.   Do not sign any  important legal documents or operate any machinery for 24 hours (because of the anesthesia used during the test).  NUTRITION  Drink plenty of fluids.   You may resume your normal diet.   Begin with a light meal and progress to your normal diet.   Avoid alcoholic beverages for 24 hours or as instructed by your caregiver.  MEDICATIONS  You may resume your normal medications unless your caregiver tells you otherwise.  WHAT YOU CAN EXPECT TODAY  You may experience abdominal discomfort such as a feeling of  fullness or gas pains.  FOLLOW-UP  Your doctor will discuss the results of your test with you.  SEEK IMMEDIATE MEDICAL ATTENTION IF ANY OF THE FOLLOWING OCCUR:  Excessive nausea (feeling sick to your stomach) and/or vomiting.   Severe abdominal pain and distention (swelling).   Trouble swallowing.   Temperature over 101 F (37.8 C).   Rectal bleeding or vomiting of blood.   GERD information provided  Try AcipHex again-20 mg twice daily. If cough recurs, stop the medication and call me  Further recommendations to follow pending review of pathology report   Gastroesophageal Reflux Disease, Adult Normally, food travels down the esophagus and stays in the stomach to be digested. However, when a person has gastroesophageal reflux disease (GERD), food and stomach acid move back up into the esophagus. When this happens, the esophagus becomes sore and inflamed. Over time, GERD can create small holes (ulcers) in the lining of the esophagus.  CAUSES This condition is caused by a problem with the muscle between the esophagus and the stomach (lower esophageal sphincter, or LES). Normally, the LES muscle closes after food passes through the esophagus to the stomach. When the LES is weakened or abnormal, it does not close properly, and that allows food and stomach acid to go back up into the esophagus. The LES can be weakened by certain dietary substances, medicines, and medical conditions, including: Tobacco use. Pregnancy. Having a hiatal hernia. Heavy alcohol use. Certain foods and beverages, such as coffee, chocolate, onions, and peppermint. RISK FACTORS This condition is more likely to develop in: People who have an increased body weight. People who have connective tissue disorders. People who use NSAID medicines. SYMPTOMS Symptoms of this condition include: Heartburn. Difficult or painful swallowing. The feeling of having a lump in the throat. Abitter taste in the mouth. Bad  breath. Having a large amount of saliva. Having an upset or bloated stomach. Belching. Chest pain. Shortness of breath or wheezing. Ongoing (chronic) cough or a night-time cough. Wearing away of tooth enamel. Weight loss. Different conditions can cause chest pain. Make sure to see your health care provider if you experience chest pain. DIAGNOSIS Your health care provider will take a medical history and perform a physical exam. To determine if you have mild or severe GERD, your health care provider may also monitor how you respond to treatment. You may also have other tests, including: An endoscopy toexamine your stomach and esophagus with a small camera. A test thatmeasures the acidity level in your esophagus. A test thatmeasures how much pressure is on your esophagus. A barium swallow or modified barium swallow to show the shape, size, and functioning of your esophagus. TREATMENT The goal of treatment is to help relieve your symptoms and to prevent complications. Treatment for this condition may vary depending on how severe your symptoms are. Your health care provider may recommend: Changes to your diet. Medicine. Surgery. HOME CARE INSTRUCTIONS Diet Follow a diet as recommended by  your health care provider. This may involve avoiding foods and drinks such as: Coffee and tea (with or without caffeine). Drinks that containalcohol. Energy drinks and sports drinks. Carbonated drinks or sodas. Chocolate and cocoa. Peppermint and mint flavorings. Garlic and onions. Horseradish. Spicy and acidic foods, including peppers, chili powder, curry powder, vinegar, hot sauces, and barbecue sauce. Citrus fruit juices and citrus fruits, such as oranges, lemons, and limes. Tomato-based foods, such as red sauce, chili, salsa, and pizza with red sauce. Fried and fatty foods, such as donuts, french fries, potato chips, and high-fat dressings. High-fat meats, such as hot dogs and fatty cuts of red  and white meats, such as rib eye steak, sausage, ham, and bacon. High-fat dairy items, such as whole milk, butter, and cream cheese. Eat small, frequent meals instead of large meals. Avoid drinking large amounts of liquid with your meals. Avoid eating meals during the 2-3 hours before bedtime. Avoid lying down right after you eat. Do not exercise right after you eat. General Instructions Pay attention to any changes in your symptoms. Take over-the-counter and prescription medicines only as told by your health care provider. Do not take aspirin, ibuprofen, or other NSAIDs unless your health care provider told you to do so. Do not use any tobacco products, including cigarettes, chewing tobacco, and e-cigarettes. If you need help quitting, ask your health care provider. Wear loose-fitting clothing. Do not wear anything tight around your waist that causes pressure on your abdomen. Raise (elevate) the head of your bed 6 inches (15cm). Try to reduce your stress, such as with yoga or meditation. If you need help reducing stress, ask your health care provider. If you are overweight, reduce your weight to an amount that is healthy for you. Ask your health care provider for guidance about a safe weight loss goal. Keep all follow-up visits as told by your health care provider. This is important. SEEK MEDICAL CARE IF: You have new symptoms. You have unexplained weight loss. You have difficulty swallowing, or it hurts to swallow. You have wheezing or a persistent cough. Your symptoms do not improve with treatment. You have a hoarse voice. SEEK IMMEDIATE MEDICAL CARE IF: You have pain in your arms, neck, jaw, teeth, or back. You feel sweaty, dizzy, or light-headed. You have chest pain or shortness of breath. You vomit and your vomit looks like blood or coffee grounds. You faint. Your stool is bloody or black. You cannot swallow, drink, or eat.   This information is not intended to replace advice  given to you by your health care provider. Make sure you discuss any questions you have with your health care provider.   Document Released: 10/28/2004 Document Revised: 10/09/2014 Document Reviewed: 05/15/2014 Elsevier Interactive Patient Education [employer].

## 2015-08-11 NOTE — Anesthesia Preprocedure Evaluation (Signed)
Anesthesia Evaluation  Patient identified by MRN, date of birth, ID band Patient awake    Reviewed: Allergy & Precautions, H&P , NPO status , Patient's Chart, lab work & pertinent test results  Airway Mallampati: II  TM Distance: >3 FB Neck ROM: Full    Dental no notable dental hx. (+) Teeth Intact   Pulmonary neg pulmonary ROS, shortness of breath and with exertion, sleep apnea , Current Smoker, former smoker,    Pulmonary exam normal breath sounds clear to auscultation       Cardiovascular hypertension, Pt. on medications Normal cardiovascular exam Rhythm:Regular Rate:Normal     Neuro/Psych Anxiety Unspecified hereditary and idiopathic peripheral neuropathy.  Neuromuscular disease    GI/Hepatic hiatal hernia, GERD  Medicated,(+) Hepatitis -, BHep B antibody positive.   Endo/Other  negative endocrine ROS  Renal/GU negative Renal ROS     Musculoskeletal  (+) Arthritis ,   Abdominal   Peds  Hematology negative hematology ROS (+)   Anesthesia Other Findings   Reproductive/Obstetrics negative OB ROS                             Anesthesia Physical Anesthesia Plan  ASA: III  Anesthesia Plan: MAC   Post-op Pain Management:    Induction: Intravenous  Airway Management Planned: Simple Face Mask  Additional Equipment:   Intra-op Plan:   Post-operative Plan:   Informed Consent: I have reviewed the patients History and Physical, chart, labs and discussed the procedure including the risks, benefits and alternatives for the proposed anesthesia with the patient or authorized representative who has indicated his/her understanding and acceptance.     Plan Discussed with:   Anesthesia Plan Comments:         Anesthesia Quick Evaluation

## 2015-08-11 NOTE — Anesthesia Procedure Notes (Signed)
Procedure Name: MAC Date/Time: 08/11/2015 8:25 AM Performed by: Vista Deck Pre-anesthesia Checklist: Patient identified, Emergency Drugs available, Suction available, Timeout performed and Patient being monitored Patient Re-evaluated:Patient Re-evaluated prior to inductionOxygen Delivery Method: Non-rebreather mask

## 2015-08-11 NOTE — Op Note (Signed)
[facility] Patient Name: Laura [name] Procedure Date: 08/11/2015 8:45 AM MRN: [id number] Date of Birth: 07-02-1955 Attending MD: Gennette Pac , MD CSN: [id number] Age: 60 Admit Type: Outpatient Procedure:                Ileo-colonoscopy with a single biopsy and stool                            sampling Indications:              Chronic diarrhea Providers:                Gennette Pac, MD, Nena Polio, RN, Calton Dach, Technician Referring MD:              Medicines:                Monitored Anesthesia Care Complications:            No immediate complications. Estimated Blood Loss:     Estimated blood loss was minimal. Procedure:                Pre-Anesthesia Assessment:                           - Prior to the procedure, a History and Physical                            was performed, and patient medications and                            allergies were reviewed. The patient's tolerance of                            previous anesthesia was also reviewed. The risks                            and benefits of the procedure and the sedation                            options and risks were discussed with the patient.                            All questions were answered, and informed consent                            was obtained. Prior Anticoagulants: The patient has                            taken no previous anticoagulant or antiplatelet                            agents. ASA Grade Assessment: II - A patient with  mild systemic disease. After reviewing the risks                            and benefits, the patient was deemed in                            satisfactory condition to undergo the procedure.                           After obtaining informed consent, the colonoscope                            was passed under direct vision. Throughout the                            procedure, the patient's blood  pressure, pulse, and                            oxygen saturations were monitored continuously. The                            [id number]( [id number]) was introduced through the anus                            and advanced to the 5 cm into the ileum. The                            colonoscopy was performed without difficulty. The                            patient tolerated the procedure well. The quality                            of the bowel preparation was adequate. The terminal                            ileum, ileocecal valve, appendiceal orifice, and                            rectum were photographed. The entire colon was well                            visualized. Scope In: 8:47:33 AM Scope Out: 9:01:09 AM Scope Withdrawal Time: 0 hours 8 minutes 7 seconds  Total Procedure Duration: 0 hours 13 minutes 36 seconds  Findings:      The perianal and digital rectal examinations were normal.      The colon (entire examined portion) appeared normal. Segmental biopsies       were taken. Estimated blood loss was minimal. The terminal ileum mucosa       appeared normal. Stool sample submitted to the microbiology lab. Impression:               - The entire examined colon is normal. Biopsy and  stool sample obtained Moderate Sedation:      Moderate (conscious) sedation was personally administered by an       anesthesia professional. The following parameters were monitored: oxygen       saturation, heart rate, blood pressure, respiratory rate, EKG, adequacy       of pulmonary ventilation, and response to care. Total physician       intraservice time was 34 minutes. Recommendation:           - Patient has a contact number available for                            emergencies. The signs and symptoms of potential                            delayed complications were discussed with the                            patient. Return to normal activities tomorrow.                             Written discharge instructions were provided to the                            patient.                           - Advance diet as tolerated.                           - Continue present medications.                           - Await pathology results.                           - Repeat colonoscopy in 10 years for screening                            purposes.                           - Return to GI clinic in 2 months. See EGD report. Procedure Code(s):        --- Professional ---                           [phone], Colonoscopy, flexible; with biopsy, single                            or multiple Diagnosis Code(s):        --- Professional ---                           K52.9, Noninfective gastroenteritis and colitis,                            unspecified CPT copyright 2016 American Medical Association. All rights reserved. The codes  documented in this report are preliminary and upon coder review may  be revised to meet current compliance requirements. Gerrit Friends. Amellia Panik, MD Gennette Pac, MD 08/11/2015 9:10:20 AM This report has been signed electronically. Number of Addenda: 0

## 2015-08-12 ENCOUNTER — Encounter: Payer: Self-pay | Admitting: Internal Medicine

## 2015-08-19 ENCOUNTER — Telehealth: Payer: Self-pay | Admitting: Internal Medicine

## 2015-08-19 NOTE — Telephone Encounter (Signed)
ONO on RA ([facility] on 08/13/15) was normal, no need for o2  Spoke with pt and notified of results per Dr. Melvyn Novas. Pt verbalized understanding and denied any questions.

## 2015-08-20 NOTE — Telephone Encounter (Signed)
I really don't have any additional recommendations, would follow Dr. Gustavus Bryant recommendations. Please relay back to [employer].

## 2015-08-20 NOTE — Telephone Encounter (Signed)
I called patient back. She states that she saw Dr. Melvyn Novas and did ONO (office note and telephone note with results is in chart). Per Dr. Melvyn Novas patient does not need O2 at night. Laura [name] would like to know if Dr. Rexene Alberts agrees with Dr. Gustavus Bryant recommendations. She does have f/u with him in 6 weeks.

## 2015-08-20 NOTE — Telephone Encounter (Signed)
Patient is calling.  She was referred to Dr. Melvyn Novas and he referred the patient to [facility] to check sleep study and check oxygen. Please call and discuss.

## 2015-08-21 NOTE — Telephone Encounter (Signed)
I spoke to patient and she is aware of recommendations below. Voiced understanding and will f/u with Dr. Melvyn Novas and will call us back if needed.

## 2015-08-22 ENCOUNTER — Encounter (HOSPITAL_COMMUNITY): Payer: Self-pay | Admitting: Internal Medicine

## 2015-08-25 ENCOUNTER — Encounter: Payer: Self-pay | Admitting: Internal Medicine

## 2015-09-08 NOTE — H&P (Signed)
Office Visit   07/07/2015 [facility]  Mahala Menghini, PA-C  Gastroenterology   Gastroesophageal reflux disease, esophagitis presence not specified +3 more  Dx   Diarrhea; Referred by [employer], MD  Reason for Visit   Progress Notes     Reviewed labs dated 05/20/2015 White blood cell count 6300, hemoglobin 14.3, hematocrit 41.8, platelets 311,000, gliadin IgA/IgG both negative, TTG IGA/IgG negative, and endomesial antibody IgA negative, creatinine 0.89, total bilirubin 0.3, alkaline phosphatase 124, AST 24, ALT 28, albumin 4.1, TSH 1.190   Never got the stool studies from PCP. Please request. OK to schedule TCS/EGD with [facility] in OR.  Hold bentyl 3 days before prep. See telephone note from 07/24/15.    Hepatitis B antibody positive A&P     Denies previous vaccination. Likely previous exposure, with subsequent immunity. Patient's ex-husband had hepatitis C. Review recent labs. Consider further evaluation.    Diarrhea A&P     60 year old female with several month history of change in bowel habits, significant diarrhea. Refractory GERD as well. Weight loss likely related to changes psychiatric medications, significant weight gain on Seroquel which is been discontinued. Obtain recent labs and stool studies. Switch pantoprazole AcipHex 20 mg twice a day. Patient will likely need to have a colonoscopy and upper endoscopy for further evaluation of her symptoms pending review of labs and stool test.      Progress Notes   Expand All Collapse All   Primary Care Physician:  Glo Herring., MD  Primary Gastroenterologist:  Garfield Cornea, MD      Chief Complaint  Patient presents with  . Diarrhea    HPI:  Laura [name] is a 60 y.o. female here At the request of Dr. Gerarda Fraction for further evaluation of diarrhea. Labs in stool studies obtained, per patient unremarkable. We have requested results.  Patient seen remotely back in 2009 for diarrhea with  workup including EGD and colonoscopy. Small bowel biopsies negative for celiac disease. Random colon biopsies unremarkable. States her diarrhea resolved for years she was predominantly constipated. One year ago, woke up with blood in underwear, previous hysterectomy. Went to see GYN, unremarkable exam. She presumed it came from her rectum. For the most part she has utilize Bentyl as needed for abdominal cramping. Admits to a lot of things going on lately and has been seeing a psychiatrist. Gained 45 pounds on Seroquel, now back down off medication. For the past 3-1/2 months she has had 6-7 loose, "muddy water" stools. No blood per rectum or melena. No solid stool. Rare nocturnal diarrhea. No longer on Bentyl. Also with bad heartburn and indigestion. Increased her PPI to twice daily and add Carafate at bedtime but still with refractory symptoms. Had been on Prilosec but it quit working, most recently on pantoprazole twice a day. Denies dysphagia, vomiting.  Z-Pak 2 weeks ago. Has had multiple medication changes related to psychiatric drugs.  Patient states she has a history of hepatitis B antibody positive test, notified by [employer].. States her ex-husband had hepatitis C.          Current Outpatient Prescriptions  Medication Sig Dispense Refill  . cholecalciferol (VITAMIN D) 1000 UNITS tablet Take 1,000 Units by mouth daily.    . citalopram (CELEXA) 20 MG tablet Take 40 mg by mouth daily.   0  . diazepam (VALIUM) 5 MG tablet Take 1 tablet by mouth every 8 (eight) hours as needed.     . dicyclomine (BENTYL) 20 MG tablet Take 20 mg by  mouth 3 (three) times daily as needed.     . DULoxetine (CYMBALTA) 30 MG capsule 30 mg.   0  . gabapentin (NEURONTIN) 300 MG capsule TAKE 1 CAPSULE BY MOUTH 2-3 TIMES DAILY AS DIRECTED 180 capsule 3  . Ginkgo Biloba 40 MG TABS Take by mouth.    . hydrochlorothiazide (MICROZIDE) 12.5 MG capsule Take 12.5 mg by mouth every morning.     Marland Kitchen  HYDROcodone-acetaminophen (NORCO) 10-325 MG per tablet Take 1 tablet by mouth every 6 (six) hours as needed.     Marland Kitchen losartan (COZAAR) 100 MG tablet Take 100 mg by mouth every morning.     . Multiple Vitamin (MULTIVITAMIN) tablet Take 1 tablet by mouth daily.    . Omega-3 Fatty Acids (FISH OIL) 1000 MG CAPS Take 2 capsules by mouth daily.    . traZODone (DESYREL) 50 MG tablet   0  . vitamin E 400 UNIT capsule Take 400 Units by mouth daily.    . Pantoprazole 40mg  Take 1 tablet (20 mg total) by mouth 2 (two) times daily before a meal. 60 tablet 5   No current facility-administered medications for this visit.         Allergies as of 07/07/2015 - Review Complete 07/07/2015  Allergen Reaction Noted  . Aspirin Other (See Comments) 06/16/2015  . Celebrex [celecoxib] Hives 07/27/2012    Past Medical History  Diagnosis Date  . Anxiety   . OA (osteoarthritis) of knee     RIGHT  . Unspecified hereditary and idiopathic peripheral neuropathy   . Hypertension   . RLS (restless legs syndrome)   . GERD (gastroesophageal reflux disease)   . H/O hiatal hernia   . Hepatitis B antibody positive     [facility]  . IBS (irritable bowel syndrome)   . Rotator cuff impingement syndrome of left shoulder   . Acromioclavicular joint arthritis     left shoulder  . History of melanoma excision     01-05-2014  . 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 Carpal tunnel release Right 1995  . Lumbar disc surgery  08-22-2003    LEFT  L5  --  S1  . Cholecystectomy  1996  . Carpal tunnel release Right 1990's  . Total abdominal hysterectomy w/ bilateral salpingoophorectomy  1999  . Anterior cervical decomp/discectomy fusion  2000    C4 -- C5  . Knee arthroscopy with medial menisectomy Right 05/25/2013    Procedure: RIGHT KNEE ARTHROSCOPY WITH PARTIAL LATERAL and MEDIAL MENISECTOMY AND  CHONDROPLASTY;  Surgeon: Sydnee Cabal, MD;  Location: [facility];  Service: Orthopedics;  Laterality: Right;  . Mohs surgery  01-05-2014    LEFT SIDE OF FACE  . Shoulder arthroscopy with subacromial decompression Left 01/29/2014    Procedure: LEFT SHOULDER ARTHROSCOPY WITH SUBACROMIAL DECOMPRESSION/DISTAL CLAVICLE RESECTION AND DEBRIDEMENT;  Surgeon: Sydnee Cabal, MD;  Location: [facility];  Service: Orthopedics;  Laterality: Left;  . Cardiac cath      unremarkable  . Esophagogastroduodenoscopy  07/2007    Dr. Gala Romney: Small hiatal hernia, duodenal biopsy unremarkable. Fundic gland gastric polyps.  . Colonoscopy  07/2007    Dr. Gala Romney: Terminal ileum normal. Status post segmental colonic mucosal biopsy.         Family History  Problem Relation Age of Onset  . Lung cancer Mother   . HIV/AIDS Father   .  Colon cancer Neg Hx   . Crohn's disease Cousin   . Breast cancer Maternal Aunt     Social History        Social History  . Marital Status: Married    Spouse Name: [name]  . Number of Children: 3  . Years of Education: GED        Occupational History  .      not employed         Social History Main Topics  . Smoking status: Former Smoker -- 0.75 packs/day for 20 years    Types: Cigarettes    Quit date: 01/26/2015  . Smokeless tobacco: Never Used  . Alcohol Use: 0.0 oz/week    0 Standard drinks or equivalent per week     Comment: VERY RARE  . Drug Use: No  . Sexual Activity: Not on file       Other Topics Concern  . Not on file      Social History Narrative   Patient lives at home with her husband Laura [name]).    Patient is not working as time but she trying for disability.   Education 10th grade   Caffeine daily coffee and mountain dew . One can daily.   .            ROS:  General: Negative for anorexia,  fever, chills, fatigue, weakness. See history of present  illness Eyes: Negative for vision changes.  ENT: Negative for hoarseness, difficulty swallowing , nasal congestion. CV: Negative for chest pain, angina, palpitations, dyspnea on exertion, peripheral edema.  Respiratory: Negative for dyspnea at rest, dyspnea on exertion, cough, sputum, wheezing.  GI: See history of present illness. GU:  Negative for dysuria, hematuria, urinary incontinence, urinary frequency, nocturnal urination.  MS: Chronic pain Derm: Negative for rash or itching.  Neuro: Negative for weakness, abnormal sensation, seizure, frequent headaches, memory loss, confusion.  Psych: Negative for anxiety, depression, suicidal ideation, hallucinations.  Endo: Negative for unusual weight change.  Heme: Negative for bruising or bleeding. Allergy: Negative for rash or hives.    Physical Examination:   General: Well-nourished, well-developed in no acute distress. Ambulates with cane. Head: Normocephalic, atraumatic.   Eyes: Conjunctiva pink, no icterus. Mouth: Oropharyngeal mucosa moist and pink , no lesions erythema or exudate. Neck: Supple without thyromegaly, masses, or lymphadenopathy.  Lungs: Clear to auscultation bilaterally.  Heart: Regular rate and rhythm, no murmurs rubs or gallops.  Abdomen: Bowel sounds are normal, nontender, nondistended, no hepatosplenomegaly or masses, no abdominal bruits or    hernia , no rebound or guarding.   Rectal: not performed Extremities: No lower extremity edema. No clubbing or deformities.  Neuro: Alert and oriented x 4 , grossly normal neurologically.  Skin: Warm and dry, no rash or jaundice.   Psych: Alert and cooperative, normal mood and affect.  Labs: requested  Imaging Studies:  Imaging Results  Mr Brain Wo Contrast  07/03/2015  CLINICAL DATA:  60 year old female with left side weakness and difficulty walking. Memory complaints, morning headaches. Depression for 2 years. Initial encounter. EXAM: MRI HEAD WITHOUT CONTRAST  TECHNIQUE: Multiplanar, multiecho pulse sequences of the brain and surrounding structures were obtained without intravenous contrast. COMPARISON:  Cervical spine radiographs 07/26/2013. FINDINGS: Cerebral volume is within normal limits for age. No restricted diffusion to suggest acute infarction. No midline shift, mass effect, evidence of mass lesion, ventriculomegaly, extra-axial collection or acute intracranial hemorrhage. Cervicomedullary junction within normal limits. Negative visualized cervical spine. Major intracranial vascular flow voids are  preserved. The pituitary is remarkable for a small 4-6 mm area of increased T2 and decreased T1 signal at the central aspect of the gland (series 2, image 11 and series 9, image 17). No suprasellar extension or mass effect. The infundibulum appears normal. Normal noncontrast appearance of the cavernous sinus. Scattered small mostly subcortical bilateral cerebral white matter foci of T2 and FLAIR hyperintensity in a nonspecific configuration. The extent is mild for age. No cortical encephalomalacia or chronic cerebral blood products deep gray matter nuclei, brainstem, and cerebellum are normal. Visible internal auditory structures appear normal. Mastoids and paranasal sinuses are clear. Negative orbit and scalp soft tissues. Normal bone marrow signal. IMPRESSION: 1.  No acute intracranial abnormality. 2. Mild for age nonspecific cerebral white matter signal changes, most commonly due to chronic small vessel disease. Acute significance doubtful. 3. 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 Clinical significance doubtful in the absence of pituitary hormone abnormality. Electronically Signed   By: Genevie Ann M.D.   On: 07/03/2015 11:29    Impression:  Chronic diarrhea. Refractory GERD. Stool studies not done. Labs unremarkable negative celiac screen. Plan for EGD and colonoscopy  The risks,  benefits, limitations, imponderables and alternatives regarding both EGD and colonoscopy have been reviewed with the patient. Questions have been answered. All parties agreeable.       Other Notes      Progress Notes from Theadora Rama  Instructions   1.      After Visit Summary (Printed 07/07/2015)  Additional Documentation   Vitals:   BP  83/57   Pulse 94   Temp 98.5 F (36.9 C)   Ht 5\' 1"  (1.549 m)   Wt 157 lb 6.4 oz (71.4 kg)   BMI 29.74 kg/m   BSA 1.75 m   Flowsheets:   MEWS Score,   Custom Formula Data,   Anthropometrics     Encounter Info:   Billing Info,   History,   Allergies,   Detailed Report     Communications      [facility] Provider CC Chart Rep sent to [employer], MD     Chart Routed to Theadora Rama and Daneil Dolin, MD  Media   Electronic signature on 07/07/2015 12:29 PM   Orders Placed    None  Medication Changes      RABEprazole Sodium 20 mg Oral 2 times daily before meals          Medication List  Visit Diagnoses         Problem List

## 2015-10-02 ENCOUNTER — Ambulatory Visit: Payer: BLUE CROSS/BLUE SHIELD | Admitting: Acute Care

## 2015-10-02 ENCOUNTER — Ambulatory Visit: Payer: BLUE CROSS/BLUE SHIELD | Admitting: Internal Medicine

## 2016-01-18 ENCOUNTER — Telehealth: Payer: Self-pay | Admitting: Gastroenterology

## 2016-01-18 DIAGNOSIS — R768 Other specified abnormal immunological findings in serum: Secondary | ICD-10-CM

## 2016-01-18 NOTE — Telephone Encounter (Signed)
Following up on old record requests. I still need copy of labs from PCP regarding Hep B and C testing. Thanks!

## 2016-01-19 NOTE — Telephone Encounter (Signed)
Requested records from [facility]

## 2016-01-23 NOTE — Telephone Encounter (Signed)
Pt would still like to come see [place]. She is willing to have the blood work done. Do you want me to put in the labs or will you?

## 2016-01-23 NOTE — Addendum Note (Signed)
Addended by: Mahala Menghini on: 01/23/2016 01:41 PM   Modules accepted: Orders

## 2016-01-23 NOTE — Telephone Encounter (Signed)
Please let patient know that we have requested records from PCP on two occasions now and never received labs regarding Hep B or C.  If she is interested in having [place] follow up on this for her, I would recommend she have labs at this point to see if she has active hepatitis B and screen for HCV.   Hep B surface Ag Hep B surface Ab Hep B core total Ab Hep C Ab  If any are positive, she may need further labs done.

## 2016-01-23 NOTE — Telephone Encounter (Signed)
She does not necessarily need an OV. Just labs for now unless she has other concerns. I have entered labs for [facility].

## 2016-01-28 NOTE — Telephone Encounter (Signed)
Lab orders mailed

## 2016-02-10 ENCOUNTER — Other Ambulatory Visit: Payer: Self-pay | Admitting: Neurology

## 2016-03-02 ENCOUNTER — Telehealth: Payer: Self-pay | Admitting: Internal Medicine

## 2016-03-02 ENCOUNTER — Other Ambulatory Visit (HOSPITAL_COMMUNITY): Payer: Self-pay | Admitting: Respiratory Therapy

## 2016-03-02 DIAGNOSIS — R0602 Shortness of breath: Secondary | ICD-10-CM

## 2016-03-02 NOTE — Telephone Encounter (Signed)
Lab orders have been faxed

## 2016-03-02 NOTE — Telephone Encounter (Signed)
Pt needs her lab orders faxed to [facility]

## 2016-03-03 LAB — HEPATITIS B CORE ANTIBODY, TOTAL: Hep B Core Total Ab: POSITIVE — AB

## 2016-03-03 LAB — HEPATITIS B SURFACE ANTIGEN: HEP B S AG: NEGATIVE

## 2016-03-03 LAB — HEPATITIS B SURFACE ANTIBODY,QUALITATIVE: Hep B Surface Ab, Qual: NONREACTIVE

## 2016-03-03 LAB — HEPATITIS C ANTIBODY: Hep C Virus Ab: <0.1 s/co ratio (ref 0.0–0.9)

## 2016-03-15 ENCOUNTER — Other Ambulatory Visit: Payer: Self-pay | Admitting: Gastroenterology

## 2016-03-19 ENCOUNTER — Ambulatory Visit (HOSPITAL_COMMUNITY)
Admission: RE | Admit: 2016-03-19 | Discharge: 2016-03-19 | Disposition: A | Discharge status: Home / Self Care | Payer: PPO | Source: Ambulatory Visit | Attending: Pulmonary Disease | Admitting: Pulmonary Disease

## 2016-03-19 DIAGNOSIS — Z87891 Personal history of nicotine dependence: Secondary | ICD-10-CM | POA: Diagnosis not present

## 2016-03-19 DIAGNOSIS — R0602 Shortness of breath: Secondary | ICD-10-CM | POA: Insufficient documentation

## 2016-03-19 MED ORDER — ALBUTEROL SULFATE (2.5 MG/3ML) 0.083% IN NEBU
2.5000 mg | INHALATION_SOLUTION | Freq: Once | RESPIRATORY_TRACT | Status: AC
  Administered 2016-03-19: 2.5 mg via RESPIRATORY_TRACT

## 2016-03-23 DIAGNOSIS — Z1283 Encounter for screening for malignant neoplasm of skin: Secondary | ICD-10-CM | POA: Diagnosis not present

## 2016-03-23 DIAGNOSIS — Z08 Encounter for follow-up examination after completed treatment for malignant neoplasm: Secondary | ICD-10-CM | POA: Diagnosis not present

## 2016-03-23 DIAGNOSIS — Z85820 Personal history of malignant melanoma of skin: Secondary | ICD-10-CM | POA: Diagnosis not present

## 2016-03-25 ENCOUNTER — Telehealth: Payer: Self-pay | Admitting: Internal Medicine

## 2016-03-25 NOTE — Telephone Encounter (Signed)
Pt called to see if her lab results were available yet. Please call [phone]

## 2016-03-25 NOTE — Telephone Encounter (Signed)
Patient may have Hep B core antibody + due to previous infection that she got over but does not have detectable surface ag OR false positive OR occult chronic Hep B.  At this point, she needs labs in 2 months. HBV DNA level, LFTs, Hep BE Ag.

## 2016-03-29 NOTE — Telephone Encounter (Signed)
Spoke with the pt, gave her lab results. She would like an ov with [facility] anytime after March 17th because she is going out of town. I have scheduled her for March 19th. Pt is aware of appt date and time.

## 2016-03-31 ENCOUNTER — Other Ambulatory Visit: Payer: Self-pay | Admitting: Gastroenterology

## 2016-03-31 DIAGNOSIS — R768 Other specified abnormal immunological findings in serum: Secondary | ICD-10-CM

## 2016-04-08 DIAGNOSIS — F419 Anxiety disorder, unspecified: Secondary | ICD-10-CM | POA: Diagnosis not present

## 2016-04-08 DIAGNOSIS — E6609 Other obesity due to excess calories: Secondary | ICD-10-CM | POA: Diagnosis not present

## 2016-04-08 DIAGNOSIS — G894 Chronic pain syndrome: Secondary | ICD-10-CM | POA: Diagnosis not present

## 2016-04-08 DIAGNOSIS — Z6831 Body mass index (BMI) 31.0-31.9, adult: Secondary | ICD-10-CM | POA: Diagnosis not present

## 2016-04-08 DIAGNOSIS — Z1389 Encounter for screening for other disorder: Secondary | ICD-10-CM | POA: Diagnosis not present

## 2016-04-08 DIAGNOSIS — F329 Major depressive disorder, single episode, unspecified: Secondary | ICD-10-CM | POA: Diagnosis not present

## 2016-04-08 DIAGNOSIS — Z0001 Encounter for general adult medical examination with abnormal findings: Secondary | ICD-10-CM | POA: Diagnosis not present

## 2016-04-09 DIAGNOSIS — E039 Hypothyroidism, unspecified: Secondary | ICD-10-CM | POA: Diagnosis not present

## 2016-04-09 DIAGNOSIS — E748 Other specified disorders of carbohydrate metabolism: Secondary | ICD-10-CM | POA: Diagnosis not present

## 2016-04-19 ENCOUNTER — Ambulatory Visit: Payer: PPO | Admitting: Gastroenterology

## 2016-04-26 ENCOUNTER — Other Ambulatory Visit: Payer: Self-pay

## 2016-04-26 DIAGNOSIS — R768 Other specified abnormal immunological findings in serum: Secondary | ICD-10-CM

## 2016-05-21 DIAGNOSIS — Z1231 Encounter for screening mammogram for malignant neoplasm of breast: Secondary | ICD-10-CM | POA: Diagnosis not present

## 2016-05-21 DIAGNOSIS — R768 Other specified abnormal immunological findings in serum: Secondary | ICD-10-CM | POA: Diagnosis not present

## 2016-05-22 LAB — HEPATIC FUNCTION PANEL
ALBUMIN: 4.4 g/dL (ref 3.6–4.8)
ALK PHOS: 157 IU/L — AB (ref 39–117)
ALT: 32 IU/L (ref 0–32)
AST: 22 IU/L (ref 0–40)
BILIRUBIN, DIRECT: 0.14 mg/dL (ref 0.00–0.40)
Bilirubin Total: 0.3 mg/dL (ref 0.0–1.2)
TOTAL PROTEIN: 7.1 g/dL (ref 6.0–8.5)

## 2016-05-22 LAB — HEPATITIS B DNA, ULTRAQUANTITATIVE, PCR: HBV DNA SERPL PCR-ACNC: NOT DETECTED IU/mL

## 2016-05-22 LAB — HEPATITIS B E ANTIGEN: HEP B E AG: NEGATIVE

## 2016-05-25 ENCOUNTER — Encounter: Payer: Self-pay | Admitting: Gastroenterology

## 2016-05-25 ENCOUNTER — Ambulatory Visit (INDEPENDENT_AMBULATORY_CARE_PROVIDER_SITE_OTHER): Payer: PPO | Admitting: Gastroenterology

## 2016-05-25 VITALS — BP 143/88 | HR 88 | Temp 97.2°F | Ht 61.5 in | Wt 178.2 lb

## 2016-05-25 DIAGNOSIS — K529 Noninfective gastroenteritis and colitis, unspecified: Secondary | ICD-10-CM

## 2016-05-25 LAB — PULMONARY FUNCTION TEST
DL/VA % pred: 99 %
DL/VA: 4.45 ml/min/mmHg/L
DLCO unc % pred: 79 %
DLCO unc: 16.59 ml/min/mmHg
FEF 25-75 Post: 2.92 L/sec
FEF 25-75 Pre: 2.08 L/sec
FEF2575-%CHANGE-POST: 40 %
FEF2575-%Pred-Post: 136 %
FEF2575-%Pred-Pre: 97 %
FEV1-%CHANGE-POST: 8 %
FEV1-%PRED-POST: 102 %
FEV1-%PRED-PRE: 94 %
FEV1-POST: 2.35 L
FEV1-PRE: 2.16 L
FEV1FVC-%CHANGE-POST: 8 %
FEV1FVC-%Pred-Pre: 102 %
FEV6-%Change-Post: 0 %
FEV6-%PRED-PRE: 94 %
FEV6-%Pred-Post: 94 %
FEV6-PRE: 2.69 L
FEV6-Post: 2.71 L
FEV6FVC-%PRED-PRE: 104 %
FEV6FVC-%Pred-Post: 104 %
FVC-%CHANGE-POST: 0 %
FVC-%Pred-Post: 91 %
FVC-%Pred-Pre: 90 %
FVC-POST: 2.71 L
FVC-Pre: 2.69 L
POST FEV1/FVC RATIO: 87 %
PRE FEV6/FVC RATIO: 100 %
Post FEV6/FVC ratio: 100 %
Pre FEV1/FVC ratio: 80 %
RV % PRED: 95 %
RV: 1.81 L
TLC % pred: 95 %
TLC: 4.45 L

## 2016-05-25 MED ORDER — DICYCLOMINE HCL 20 MG PO TABS: 20.0000 mg | ORAL_TABLET | Freq: Three times a day (TID) | ORAL | 5 refills | Status: DC

## 2016-05-25 NOTE — Progress Notes (Signed)
CC'ED TO PCP 

## 2016-05-25 NOTE — Patient Instructions (Signed)
1. Start Bentyl 20mg  30 minutes before each meal three times daily.  2. Let me know how your are doing in 2 weeks.   Diet for Irritable Bowel Syndrome When you have irritable bowel syndrome (IBS), the foods you eat and your eating habits are very important. IBS may cause various symptoms, such as abdominal pain, constipation, or diarrhea. Choosing the right foods can help ease discomfort caused by these symptoms. Work with your health care provider and dietitian to find the best eating plan to help control your symptoms. What general guidelines do I need to follow?  Keep a food diary. This will help you identify foods that cause symptoms. Write down:  What you eat and when.  What symptoms you have.  When symptoms occur in relation to your meals.  Avoid foods that cause symptoms. Talk with your dietitian about other ways to get the same nutrients that are in these foods.  Eat more foods that contain fiber. Take a fiber supplement if directed by your dietitian.  Eat your meals slowly, in a relaxed setting.  Aim to eat 5-6 small meals per day. Do not skip meals.  Drink enough fluids to keep your urine clear or pale yellow.  Ask your health care provider if you should take an over-the-counter probiotic during flare-ups to help restore healthy gut bacteria.  If you have cramping or diarrhea, try making your meals low in fat and high in carbohydrates. Examples of carbohydrates are pasta, rice, whole grain breads and cereals, fruits, and vegetables.  If dairy products cause your symptoms to flare up, try eating less of them. You might be able to handle yogurt better than other dairy products because it contains bacteria that help with digestion. What foods are not recommended? The following are some foods and drinks that may worsen your symptoms:  Fatty foods, such as [place] fries.  Milk products, such as cheese or ice cream.  Chocolate.  Alcohol.  Products with caffeine, such as  coffee.  Carbonated drinks, such as soda. The items listed above may not be a complete list of foods and beverages to avoid. Contact your dietitian for more information.  What foods are good sources of fiber? Your health care provider or dietitian may recommend that you eat more foods that contain fiber. Fiber can help reduce constipation and other IBS symptoms. Add foods with fiber to your diet a little at a time so that your body can get used to them. Too much fiber at once might cause gas and swelling of your abdomen. The following are some foods that are good sources of fiber:  Apples.  Peaches.  Pears.  Berries.  Figs.  Broccoli (raw).  Cabbage.  Carrots.  Raw peas.  Kidney beans.  Lima beans.  Whole grain bread.  Whole grain cereal. Where to find more information: [employer] for Functional Gastrointestinal Disorders: www.iffgd.[employer] of Diabetes and Digestive and Kidney Diseases: NetworkAffair.co.za.aspx This information is not intended to replace advice given to you by your health care provider. Make sure you discuss any questions you have with your health care provider. Document Released: 04/10/2003 Document Revised: 06/26/2015 Document Reviewed: 04/20/2013 Elsevier Interactive Patient Education  2017 [employer].
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
## 2016-05-25 NOTE — Assessment & Plan Note (Signed)
61 year old female with one-year history of chronic diarrhea, similar presentation several years back and was worked up for microscopic colitis and celiac disease. Or recent evaluation includes negative GI pathogen panel and C. difficile PCR. She had ileocolonoscopy and July 2017 with random colon biopsies negative for microscopic colitis. Endoscopically her colon appeared normal. Repeat colonoscopy planned for 10 years. EGD showed LA grade a esophagitis, small hiatal hernia, a few pedunculated and sessile polyps in the stomach, biopsies showed fundic gland polyps.  Almost certain that we're dealing with IBS-D. Recent vacationing resulted in constipation. Denies nocturnal symptoms. No flushing. Often with early a.m. stools prior to eating. Currently not on Bentyl. Resume Bentyl 20 mg every before meals (3 times a day). Hold for constipation. She'll call in a couple weeks and let us know how she is doing. If no dramatic improvement consider changing therapy versus further workup. Patient is not a candidate for Viberzi given previous cholecystectomy.

## 2016-05-28 DIAGNOSIS — H40013 Open angle with borderline findings, low risk, bilateral: Secondary | ICD-10-CM | POA: Diagnosis not present

## 2016-05-28 DIAGNOSIS — Z83511 Family history of glaucoma: Secondary | ICD-10-CM | POA: Diagnosis not present

## 2016-05-28 DIAGNOSIS — H2513 Age-related nuclear cataract, bilateral: Secondary | ICD-10-CM | POA: Diagnosis not present

## 2016-06-02 ENCOUNTER — Encounter: Payer: Self-pay | Admitting: Gastroenterology

## 2016-06-02 NOTE — Progress Notes (Signed)
Discussed at recent [facility]. Need to [facility]IC for repeat LFTs along with Hep BE antigen in 4 months.

## 2016-06-03 ENCOUNTER — Other Ambulatory Visit: Payer: Self-pay | Admitting: Gastroenterology

## 2016-06-03 ENCOUNTER — Telehealth: Payer: Self-pay

## 2016-06-03 DIAGNOSIS — R768 Other specified abnormal immunological findings in serum: Secondary | ICD-10-CM

## 2016-06-03 DIAGNOSIS — K529 Noninfective gastroenteritis and colitis, unspecified: Secondary | ICD-10-CM

## 2016-06-03 NOTE — Telephone Encounter (Signed)
-----   Message from Mahala Menghini, PA-C sent at 06/03/2016 11:10 AM EDT ----- Almyra Free, I told you to order Hep BE antigen BUT I meant Hep BE antibody. Please change the order. Copy this into a telephone note. Thanks!

## 2016-06-03 NOTE — Telephone Encounter (Signed)
New Lab order on file. Other lab order cancelled.

## 2016-06-09 ENCOUNTER — Telehealth: Payer: Self-pay

## 2016-06-09 NOTE — Telephone Encounter (Signed)
Pt called- left voicemail with progress report. She said she has been taking the bentyl and is doing ok but now she is having some cramping when she has a bm. She wanted to know if this was something she should be worried about?  [phone]

## 2016-06-10 ENCOUNTER — Encounter: Payer: Self-pay | Admitting: Internal Medicine

## 2016-06-10 NOTE — Telephone Encounter (Signed)
Let's have her decrease dose to Bentyl 20mg  in the morning before breakfast. She can take it one more before lunch or dinner if loose stool or frequent stool. Call if still with ongoing cramping.   OV with rmr in 2 months.

## 2016-06-10 NOTE — Telephone Encounter (Signed)
APPT MADE AND LETTER SENT  °

## 2016-06-10 NOTE — Telephone Encounter (Signed)
Pt is aware.  Please nic ov with RMR.

## 2016-07-06 ENCOUNTER — Ambulatory Visit (INDEPENDENT_AMBULATORY_CARE_PROVIDER_SITE_OTHER): Payer: PPO | Admitting: Urology

## 2016-07-06 DIAGNOSIS — D3502 Benign neoplasm of left adrenal gland: Secondary | ICD-10-CM

## 2016-07-06 DIAGNOSIS — R311 Benign essential microscopic hematuria: Secondary | ICD-10-CM

## 2016-07-06 DIAGNOSIS — R3915 Urgency of urination: Secondary | ICD-10-CM

## 2016-08-11 DIAGNOSIS — F419 Anxiety disorder, unspecified: Secondary | ICD-10-CM | POA: Diagnosis not present

## 2016-08-11 DIAGNOSIS — E669 Obesity, unspecified: Secondary | ICD-10-CM | POA: Diagnosis not present

## 2016-08-11 DIAGNOSIS — Z1389 Encounter for screening for other disorder: Secondary | ICD-10-CM | POA: Diagnosis not present

## 2016-08-11 DIAGNOSIS — G894 Chronic pain syndrome: Secondary | ICD-10-CM | POA: Diagnosis not present

## 2016-08-11 DIAGNOSIS — Z6832 Body mass index (BMI) 32.0-32.9, adult: Secondary | ICD-10-CM | POA: Diagnosis not present

## 2016-08-11 DIAGNOSIS — G4700 Insomnia, unspecified: Secondary | ICD-10-CM | POA: Diagnosis not present

## 2016-08-17 ENCOUNTER — Ambulatory Visit: Payer: PPO | Admitting: Internal Medicine

## 2016-08-18 ENCOUNTER — Encounter: Payer: Self-pay | Admitting: Internal Medicine

## 2016-08-31 ENCOUNTER — Other Ambulatory Visit: Payer: Self-pay | Admitting: Nurse Practitioner

## 2016-08-31 ENCOUNTER — Other Ambulatory Visit: Payer: Self-pay

## 2016-08-31 DIAGNOSIS — R768 Other specified abnormal immunological findings in serum: Secondary | ICD-10-CM

## 2016-09-22 DIAGNOSIS — Z85820 Personal history of malignant melanoma of skin: Secondary | ICD-10-CM | POA: Diagnosis not present

## 2016-09-22 DIAGNOSIS — Z08 Encounter for follow-up examination after completed treatment for malignant neoplasm: Secondary | ICD-10-CM | POA: Diagnosis not present

## 2016-09-22 DIAGNOSIS — D225 Melanocytic nevi of trunk: Secondary | ICD-10-CM | POA: Diagnosis not present

## 2016-09-22 DIAGNOSIS — Z1283 Encounter for screening for malignant neoplasm of skin: Secondary | ICD-10-CM | POA: Diagnosis not present

## 2016-10-07 DIAGNOSIS — R768 Other specified abnormal immunological findings in serum: Secondary | ICD-10-CM | POA: Diagnosis not present

## 2016-10-08 ENCOUNTER — Ambulatory Visit: Payer: PPO | Admitting: Internal Medicine

## 2016-10-08 LAB — HEPATIC FUNCTION PANEL
ALK PHOS: 164 IU/L — AB (ref 39–117)
ALT: 27 IU/L (ref 0–32)
AST: 25 IU/L (ref 0–40)
Albumin: 4.4 g/dL (ref 3.6–4.8)
BILIRUBIN, DIRECT: 0.1 mg/dL (ref 0.00–0.40)
Bilirubin Total: 0.3 mg/dL (ref 0.0–1.2)
Total Protein: 7 g/dL (ref 6.0–8.5)

## 2016-10-08 LAB — HEPATITIS B E ANTIBODY: HEP B E AB: NEGATIVE

## 2016-10-12 ENCOUNTER — Encounter: Payer: Self-pay | Admitting: Internal Medicine

## 2016-10-12 ENCOUNTER — Ambulatory Visit (INDEPENDENT_AMBULATORY_CARE_PROVIDER_SITE_OTHER): Payer: PPO | Admitting: Internal Medicine

## 2016-10-12 VITALS — BP 135/90 | HR 110 | Temp 97.5°F | Ht 61.5 in | Wt 175.6 lb

## 2016-10-12 DIAGNOSIS — K219 Gastro-esophageal reflux disease without esophagitis: Secondary | ICD-10-CM | POA: Diagnosis not present

## 2016-10-12 DIAGNOSIS — R109 Unspecified abdominal pain: Secondary | ICD-10-CM

## 2016-10-12 DIAGNOSIS — K580 Irritable bowel syndrome with diarrhea: Secondary | ICD-10-CM | POA: Diagnosis not present

## 2016-10-12 DIAGNOSIS — R945 Abnormal results of liver function studies: Secondary | ICD-10-CM

## 2016-10-12 DIAGNOSIS — R7989 Other specified abnormal findings of blood chemistry: Secondary | ICD-10-CM

## 2016-10-12 MED ORDER — HYOSCYAMINE SULFATE SL 0.125 MG SL SUBL: 0.1250 mg | SUBLINGUAL_TABLET | Freq: Three times a day (TID) | SUBLINGUAL | 11 refills | Status: DC

## 2016-10-12 NOTE — Progress Notes (Signed)
Primary Care Physician:  Redmond School, MD Primary Gastroenterologist:  Dr. Gala Romney  Pre-Procedure History & Physical: HPI:  Laura [name] is a 61 y.o. female here for follow-up GERD IBS D,  and mildly elevated alkaline phosphatase. Hepatitis B core antibody positive  -  likely false positive. Hepatitis C antibody negative.  Patient continues to have intermittent postprandial nonbloody diarrhea. She been extensively evaluated. She felt to have IBS D. Takes Bentyl only about once a day with the modest improvement in symptoms. Took Levsin in the past but cannot recall effectiveness.  Aciphex controlling reflux symptoms nicely.  Past Medical History:  Diagnosis Date  . Acromioclavicular joint arthritis    left shoulder  . Anxiety   . 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 GERD (gastroesophageal reflux disease)   . H/O hiatal hernia   . Hepatitis B antibody positive    letter from red cross, Hep B core total ab positive  . History of melanoma excision    01-05-2014  . Hypertension   . IBS (irritable bowel syndrome)   . Nocturnal hypoxia   . OA (osteoarthritis) of knee    RIGHT  . RLS (restless legs syndrome)   . Rotator cuff impingement syndrome of left shoulder   . Shortness of breath dyspnea   . Unspecified hereditary and idiopathic peripheral neuropathy     Past Surgical History:  Procedure Laterality Date  . ABDOMINAL HYSTERECTOMY    . ANTERIOR CERVICAL DECOMP/DISCECTOMY FUSION  2000   C4 -- C5  . BIOPSY  08/11/2015   Procedure: BIOPSY;  Surgeon: Daneil Dolin, MD;  [facility];  Service: Endoscopy;;  gastric polyp;  . cardiac cath     unremarkable  . CARPAL TUNNEL RELEASE Right 1995  . CARPAL TUNNEL RELEASE Right 1990's  . CHOLECYSTECTOMY  1996  . COLONOSCOPY  07/2007   Dr. Gala Romney: Terminal ileum normal. Status post segmental colonic mucosal biopsy.  . COLONOSCOPY WITH PROPOFOL N/A 08/11/2015   Procedure: COLONOSCOPY WITH PROPOFOL;  Surgeon: Daneil Dolin,  MD;  [facility]: [facility];  Service: Endoscopy;  Laterality: N/A;  130 - moved to 8:15, pt knows to arrive at 6:45  . ESOPHAGOGASTRODUODENOSCOPY  07/2007   Dr. Gala Romney: Small hiatal hernia, duodenal biopsy unremarkable. Fundic gland gastric polyps.  . ESOPHAGOGASTRODUODENOSCOPY (EGD) WITH PROPOFOL N/A 08/11/2015   Procedure: ESOPHAGOGASTRODUODENOSCOPY (EGD) WITH PROPOFOL;  Surgeon: Daneil Dolin, MD;  [facility]: [facility];  Service: Endoscopy;  Laterality: N/A;  . KNEE ARTHROSCOPY WITH MEDIAL MENISECTOMY Right 05/25/2013   Procedure: RIGHT KNEE ARTHROSCOPY WITH PARTIAL LATERAL and MEDIAL MENISECTOMY AND CHONDROPLASTY;  Surgeon: Sydnee Cabal, MD;  Location: [facility];  Service: Orthopedics;  Laterality: Right;  . LUMBAR DISC SURGERY  08-22-2003   LEFT  L5  --  S1  . MOHS SURGERY  01-05-2014   LEFT SIDE OF FACE  . SHOULDER ARTHROSCOPY WITH SUBACROMIAL DECOMPRESSION Left 01/29/2014   Procedure: LEFT SHOULDER ARTHROSCOPY WITH SUBACROMIAL DECOMPRESSION/DISTAL CLAVICLE RESECTION AND DEBRIDEMENT;  Surgeon: Sydnee Cabal, MD;  Location: [facility];  Service: Orthopedics;  Laterality: Left;  . TOTAL ABDOMINAL HYSTERECTOMY W/ BILATERAL SALPINGOOPHORECTOMY  1999    Prior to Admission medications   Medication Sig Start Date End Date Taking? Authorizing Provider  ALPRAZolam Duanne Moron) 0.5 MG tablet Take 0.5 mg by mouth as needed. 05/01/16  Yes [provider]  diazepam (VALIUM) 5 MG tablet Take 1 tablet by mouth every 8 (eight) hours as needed  for anxiety.  07/06/12  Yes [provider]  dicyclomine (BENTYL) 20 MG tablet Take 1 tablet (20 mg total) by mouth 3 (three) times daily before meals. Hold for constipation 05/25/16  Yes Mahala Menghini, PA-C  gabapentin (NEURONTIN) 300 MG capsule TAKE 1 CAPSULE BY MOUTH 2-3 TIMES DAILY AS DIRECTED 02/10/16  Yes Star Age, MD  hydrochlorothiazide (MICROZIDE) 12.5 MG capsule Take 12.5 mg by mouth every morning.   07/23/12  Yes [provider]  HYDROcodone-acetaminophen (NORCO) 10-325 MG per tablet Take 1 tablet by mouth every 6 (six) hours as needed.    Yes [provider]  losartan (COZAAR) 100 MG tablet Take 100 mg by mouth every morning.  07/23/12  Yes [provider]  mirtazapine (REMERON) 45 MG tablet Take 45 mg by mouth at bedtime. Alternates with Trazodone 05/16/16  Yes [provider]  Multiple Vitamin (MULTIVITAMIN) tablet Take 1 tablet by mouth daily.   Yes [provider]  RABEprazole (ACIPHEX) 20 MG tablet TAKE ONE TABLET BY MOUTH TWICE DAILY BEFORE A MEAL 08/31/16  Yes Annitta Needs, NP  sucralfate (CARAFATE) 1 g tablet Take 1 g by mouth as needed.    Yes [provider]  traZODone (DESYREL) 50 MG tablet Take 50-100 mg by mouth at bedtime. Alternates with Remeron 04/16/15   [provider]  vitamin E 400 UNIT capsule Take 400 Units by mouth daily.    [provider]    Allergies as of 10/12/2016 - Review Complete 10/12/2016  Allergen Reaction Noted  . Aspirin Other (See Comments) 06/16/2015  . Celebrex [celecoxib] Hives 07/27/2012    Family History  Problem Relation Age of Onset  . Lung cancer Mother   . HIV/AIDS Father   . Crohn's disease Cousin   . Breast cancer Maternal Aunt   . Colon cancer Neg Hx     Social History   Social History  . Marital status: Married    Spouse name: [name]  . Number of children: 3  . Years of education: GED   Occupational History  .      not employed   Social History Main Topics  . Smoking status: Former Smoker    Packs/day: 1.00    Years: 20.00    Types: Cigarettes    Quit date: 01/26/2015  . Smokeless tobacco: Never Used  . Alcohol use 0.0 oz/week     Comment: VERY RARE  . Drug use: No  . Sexual activity: Not on file   Other Topics Concern  . Not on file   Social History Narrative   Patient lives at home with her husband [name]).    Patient is not working as  time but she trying for disability.   Education 10th grade   Caffeine daily coffee and mountain dew . One can daily.   .          Review of Systems: See HPI, otherwise negative ROS  Physical Exam: BP 135/90   Pulse (!) 110   Temp (!) 97.5 F (36.4 C) (Oral)   Ht 5' 1.5" (1.562 m)   Wt 175 lb 9.6 oz (79.7 kg)   BMI 32.64 kg/m  General:   Alert,   pleasant and cooperative in NAD Abdomen: Non-distended, normal bowel sounds.  Soft and nontender without appreciable mass or hepatosplenomegaly.  Pulses:  Normal pulses noted. Extremities:  Without clubbing or edema.  Impression:  GERD well controlled on AcipHex. IBS D-continues to have frequent symptoms. She may well  benefit from easier on demand therapy.  Positive hepatitis B core antibody-likely false positive. Mildly elevated alkaline phosphatase-will repeat prior to next office visit  Recommendations:  Stop Bentyl  Trial of levsin 0.125 mg tab - one SL before meals and at bedtime as needed Disp 60 with 11 refills ([facility])  Continue Aciphex daily  GERD and IBS information provided  Repeat hepatic profile in 3 months  Office visit in 3 months   Notice: This dictation was prepared with Dragon dictation along with smaller phrase technology. Any transcriptional errors that result from this process are unintentional and may not be corrected upon review.

## 2016-10-12 NOTE — Patient Instructions (Signed)
Stop Bentyl  Trial of levsin 0.125 mg tab - one SL before meals and at bedtime as needed Disp 60 with 11 refills ([facility])  Continue Aciphex daily  GERD and IBS information provided  Repeat hepatic profile in 3 months  Office visit in 3 months

## 2016-10-13 ENCOUNTER — Telehealth: Payer: Self-pay | Admitting: Internal Medicine

## 2016-10-13 NOTE — Telephone Encounter (Signed)
Pt was seen yesterday by RMR and called today saying that [facility] told her her insurance denied the prescription of Hyoscyamine because it wasn't FDA approved. Pt asked for [place] to follow up on this because she knows that this medicine has been out for some time. Please advise and call her at [phone]

## 2016-10-14 NOTE — Telephone Encounter (Signed)
Called [facility], spoke with Barnett Applebaum, she said when they try to run the rx, it is telling them that it is not covered by her insurance. I am going to try to do a PA and see if it will be covered. She is going to fax me the information I need.

## 2016-10-18 NOTE — Progress Notes (Signed)
AP still up but stable. Seen by RMR recently with plans to repeat LFTs in 3 months. Please ask her to fast prior to her next LFTs for RMR.

## 2016-10-18 NOTE — Telephone Encounter (Signed)
PA has been done, waiting on response from insurance.

## 2016-10-19 NOTE — Telephone Encounter (Signed)
Received PA denial from insurance. It states "the requested medication has been denied under Medicare part D becasue it is considered to be less-than-effective"  Dr.Rourk, do you want to send in something else for her?

## 2016-10-19 NOTE — Telephone Encounter (Signed)
This is a standard treatment for irritable bowel syndrome. Alternatively, could try Bentyl 10 mg tablet one before meals and at bedtime 4 times a day. Dispense 60 with 5 refills

## 2016-10-20 NOTE — Telephone Encounter (Signed)
Tried to call pt- NA-LMOM. Per pt chart, she has already tried bentyl. Will contact insurance to see if we can do an appeal.

## 2016-10-20 NOTE — Telephone Encounter (Signed)
Appeal is done and faxed to insurance co.

## 2016-10-26 NOTE — Telephone Encounter (Signed)
Appeal has also been denied by her insurance. I called and informed the pt, she stated she understood. She is still taking bentyl at this time and it doesn't seem to help much. She said she might try to just buy the rx. It is $25.00. She said she appreciated the effort.

## 2016-10-28 DIAGNOSIS — Z23 Encounter for immunization: Secondary | ICD-10-CM | POA: Diagnosis not present

## 2016-11-20 ENCOUNTER — Other Ambulatory Visit: Payer: Self-pay | Admitting: Gastroenterology

## 2016-11-29 DIAGNOSIS — H409 Unspecified glaucoma: Secondary | ICD-10-CM | POA: Diagnosis not present

## 2016-11-29 DIAGNOSIS — G894 Chronic pain syndrome: Secondary | ICD-10-CM | POA: Diagnosis not present

## 2016-11-29 DIAGNOSIS — Z6833 Body mass index (BMI) 33.0-33.9, adult: Secondary | ICD-10-CM | POA: Diagnosis not present

## 2016-11-29 DIAGNOSIS — E236 Other disorders of pituitary gland: Secondary | ICD-10-CM | POA: Diagnosis not present

## 2016-12-14 ENCOUNTER — Other Ambulatory Visit: Payer: Self-pay

## 2016-12-15 ENCOUNTER — Other Ambulatory Visit: Payer: Self-pay

## 2016-12-15 DIAGNOSIS — R109 Unspecified abdominal pain: Secondary | ICD-10-CM

## 2016-12-16 MED ORDER — RABEPRAZOLE SODIUM 20 MG PO TBEC: 20.0000 mg | DELAYED_RELEASE_TABLET | Freq: Two times a day (BID) | ORAL | 5 refills | Status: DC

## 2016-12-27 DIAGNOSIS — R109 Unspecified abdominal pain: Secondary | ICD-10-CM | POA: Diagnosis not present

## 2016-12-28 LAB — HEPATIC FUNCTION PANEL
ALK PHOS: 160 IU/L — AB (ref 39–117)
ALT: 31 IU/L (ref 0–32)
AST: 23 IU/L (ref 0–40)
Albumin: 4.1 g/dL (ref 3.6–4.8)
BILIRUBIN TOTAL: 0.4 mg/dL (ref 0.0–1.2)
BILIRUBIN, DIRECT: 0.12 mg/dL (ref 0.00–0.40)
TOTAL PROTEIN: 6.7 g/dL (ref 6.0–8.5)

## 2017-01-03 DIAGNOSIS — Z6834 Body mass index (BMI) 34.0-34.9, adult: Secondary | ICD-10-CM | POA: Diagnosis not present

## 2017-01-03 DIAGNOSIS — E236 Other disorders of pituitary gland: Secondary | ICD-10-CM | POA: Diagnosis not present

## 2017-01-03 DIAGNOSIS — I10 Essential (primary) hypertension: Secondary | ICD-10-CM | POA: Diagnosis not present

## 2017-01-04 ENCOUNTER — Other Ambulatory Visit: Payer: Self-pay

## 2017-01-04 ENCOUNTER — Other Ambulatory Visit: Payer: Self-pay | Admitting: *Deleted

## 2017-01-04 DIAGNOSIS — R109 Unspecified abdominal pain: Secondary | ICD-10-CM

## 2017-01-04 DIAGNOSIS — R748 Abnormal levels of other serum enzymes: Secondary | ICD-10-CM

## 2017-01-06 ENCOUNTER — Ambulatory Visit (HOSPITAL_COMMUNITY)
Admission: RE | Admit: 2017-01-06 | Discharge: 2017-01-06 | Disposition: A | Discharge status: Home / Self Care | Payer: PPO | Source: Ambulatory Visit | Attending: Internal Medicine | Admitting: Internal Medicine

## 2017-01-06 DIAGNOSIS — Z9049 Acquired absence of other specified parts of digestive tract: Secondary | ICD-10-CM | POA: Diagnosis not present

## 2017-01-06 DIAGNOSIS — R109 Unspecified abdominal pain: Secondary | ICD-10-CM | POA: Insufficient documentation

## 2017-01-06 DIAGNOSIS — K760 Fatty (change of) liver, not elsewhere classified: Secondary | ICD-10-CM | POA: Diagnosis not present

## 2017-01-11 ENCOUNTER — Other Ambulatory Visit: Payer: Self-pay | Admitting: Nurse Practitioner

## 2017-01-19 ENCOUNTER — Other Ambulatory Visit: Payer: Self-pay | Admitting: Internal Medicine

## 2017-01-19 ENCOUNTER — Telehealth: Payer: Self-pay

## 2017-01-19 DIAGNOSIS — R109 Unspecified abdominal pain: Secondary | ICD-10-CM | POA: Diagnosis not present

## 2017-01-19 NOTE — Telephone Encounter (Signed)
Pt called to have lab orders faxed to [phone]. Pts lab orders were done faxed to pts provider.

## 2017-01-20 LAB — MITOCHONDRIAL ANTIBODIES: MITOCHONDRIAL AB: 151.4 U — AB (ref 0.0–20.0)

## 2017-01-27 ENCOUNTER — Other Ambulatory Visit (HOSPITAL_COMMUNITY): Payer: Self-pay | Admitting: Neurosurgery

## 2017-01-27 ENCOUNTER — Encounter: Payer: Self-pay | Admitting: Internal Medicine

## 2017-01-27 DIAGNOSIS — E236 Other disorders of pituitary gland: Secondary | ICD-10-CM

## 2017-01-27 NOTE — Progress Notes (Signed)
APPOINTMENT MADE °

## 2017-02-03 ENCOUNTER — Ambulatory Visit (HOSPITAL_COMMUNITY)
Admission: RE | Admit: 2017-02-03 | Discharge: 2017-02-03 | Disposition: A | Discharge status: Home / Self Care | Payer: PPO | Source: Ambulatory Visit | Attending: Neurosurgery | Admitting: Neurosurgery

## 2017-02-03 DIAGNOSIS — E236 Other disorders of pituitary gland: Secondary | ICD-10-CM | POA: Diagnosis not present

## 2017-02-03 DIAGNOSIS — I6782 Cerebral ischemia: Secondary | ICD-10-CM | POA: Insufficient documentation

## 2017-02-03 DIAGNOSIS — R269 Unspecified abnormalities of gait and mobility: Secondary | ICD-10-CM | POA: Diagnosis not present

## 2017-02-03 LAB — POCT I-STAT CREATININE: CREATININE: 0.9 mg/dL (ref 0.44–1.00)

## 2017-02-03 MED ORDER — GADOBENATE DIMEGLUMINE 529 MG/ML IV SOLN
10.0000 mL | Freq: Once | INTRAVENOUS | Status: AC | PRN
  Administered 2017-02-03: 7 mL via INTRAVENOUS

## 2017-02-05 NOTE — Progress Notes (Signed)
If there is a sooner appointment available we should try to get her in sooner.

## 2017-02-05 NOTE — Progress Notes (Signed)
Not necessary to use urgent spot since this is chronic condition.

## 2017-02-07 NOTE — Progress Notes (Signed)
ADDED PATIENT TO A CANCELLATION LIST

## 2017-02-08 DIAGNOSIS — H1849 Other corneal degeneration: Secondary | ICD-10-CM | POA: Diagnosis not present

## 2017-02-08 DIAGNOSIS — H40002 Preglaucoma, unspecified, left eye: Secondary | ICD-10-CM | POA: Diagnosis not present

## 2017-02-08 DIAGNOSIS — H2513 Age-related nuclear cataract, bilateral: Secondary | ICD-10-CM | POA: Diagnosis not present

## 2017-02-08 DIAGNOSIS — H16223 Keratoconjunctivitis sicca, not specified as Sjogren's, bilateral: Secondary | ICD-10-CM | POA: Diagnosis not present

## 2017-02-08 DIAGNOSIS — H524 Presbyopia: Secondary | ICD-10-CM | POA: Diagnosis not present

## 2017-02-08 DIAGNOSIS — H04123 Dry eye syndrome of bilateral lacrimal glands: Secondary | ICD-10-CM | POA: Diagnosis not present

## 2017-02-08 DIAGNOSIS — H40013 Open angle with borderline findings, low risk, bilateral: Secondary | ICD-10-CM | POA: Diagnosis not present

## 2017-02-08 DIAGNOSIS — H40001 Preglaucoma, unspecified, right eye: Secondary | ICD-10-CM | POA: Diagnosis not present

## 2017-02-08 DIAGNOSIS — H1859 Other hereditary corneal dystrophies: Secondary | ICD-10-CM | POA: Diagnosis not present

## 2017-02-17 DIAGNOSIS — K760 Fatty (change of) liver, not elsewhere classified: Secondary | ICD-10-CM | POA: Diagnosis not present

## 2017-02-17 DIAGNOSIS — G894 Chronic pain syndrome: Secondary | ICD-10-CM | POA: Diagnosis not present

## 2017-02-17 DIAGNOSIS — K219 Gastro-esophageal reflux disease without esophagitis: Secondary | ICD-10-CM | POA: Diagnosis not present

## 2017-02-17 DIAGNOSIS — Z1389 Encounter for screening for other disorder: Secondary | ICD-10-CM | POA: Diagnosis not present

## 2017-02-17 DIAGNOSIS — Z6833 Body mass index (BMI) 33.0-33.9, adult: Secondary | ICD-10-CM | POA: Diagnosis not present

## 2017-02-17 DIAGNOSIS — K7460 Unspecified cirrhosis of liver: Secondary | ICD-10-CM | POA: Diagnosis not present

## 2017-02-17 DIAGNOSIS — F329 Major depressive disorder, single episode, unspecified: Secondary | ICD-10-CM | POA: Diagnosis not present

## 2017-02-17 DIAGNOSIS — E669 Obesity, unspecified: Secondary | ICD-10-CM | POA: Diagnosis not present

## 2017-02-18 ENCOUNTER — Encounter: Payer: Self-pay | Admitting: Gastroenterology

## 2017-02-18 ENCOUNTER — Other Ambulatory Visit: Payer: Self-pay

## 2017-02-18 ENCOUNTER — Ambulatory Visit: Payer: PPO | Admitting: Gastroenterology

## 2017-02-18 ENCOUNTER — Encounter: Payer: Self-pay | Admitting: *Deleted

## 2017-02-18 VITALS — BP 167/94 | HR 104 | Temp 97.9°F | Ht 61.5 in | Wt 184.2 lb

## 2017-02-18 DIAGNOSIS — K743 Primary biliary cirrhosis: Secondary | ICD-10-CM | POA: Insufficient documentation

## 2017-02-18 DIAGNOSIS — K529 Noninfective gastroenteritis and colitis, unspecified: Secondary | ICD-10-CM | POA: Diagnosis not present

## 2017-02-18 MED ORDER — RIFAXIMIN 550 MG PO TABS: 550.0000 mg | ORAL_TABLET | Freq: Three times a day (TID) | ORAL | 0 refills | Status: AC

## 2017-02-18 MED ORDER — URSODIOL 500 MG PO TABS: 500.0000 mg | ORAL_TABLET | Freq: Two times a day (BID) | ORAL | 3 refills | Status: DC

## 2017-02-18 NOTE — Progress Notes (Signed)
Referring Provider: Redmond School, MD Primary Care Physician:  Redmond School, MD Primary GI: Dr. Gala Romney   Chief Complaint  Patient presents with  . Gastroesophageal Reflux  . Elevated Hepatic Enzymes    HPI:   Laura [name] is a 62 y.o. female presenting today with a history of chronic diarrhea, previously evaluated extensively and worked up for microscopic colitis and celiac disease. Ileocolonoscopy in July 2017 negative for microscopic colitis. Felt to have IBS-D. Hep B core antibody positive in past but felt to be a false positive. Hep C antibody negative.   Chronic diarrhea: Bentyl with just modest improvement. Prescribed Levsin at last visit. Insurance wouldn't cover. Usually has diarrhea until 2:30 in the evening.   GERD: Aciphex controlling reflux.   Elevated alk phos: AMA markedly positive at 151. Felt to likely have [place]. [place] on file from Dec 2018 with fatty liver or hepatocellular disease. States her mom's half sister (share a mother) has PBC and her cousin. Always fatigued. No pruritis. No abdominal pain.    Past Medical History:  Diagnosis Date  . Acromioclavicular joint arthritis    left shoulder  . Anxiety   . 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 GERD (gastroesophageal reflux disease)   . H/O hiatal hernia   . Hepatitis B antibody positive    letter from [facility], Hep B core total ab positive  . History of melanoma excision    01-05-2014  . Hypertension   . IBS (irritable bowel syndrome)   . Nocturnal hypoxia   . OA (osteoarthritis) of knee    RIGHT  . RLS (restless legs syndrome)   . Rotator cuff impingement syndrome of left shoulder   . Shortness of breath dyspnea   . Unspecified hereditary and idiopathic peripheral neuropathy     Past Surgical History:  Procedure Laterality Date  . ABDOMINAL HYSTERECTOMY    . ANTERIOR CERVICAL DECOMP/DISCECTOMY FUSION  2000   C4 -- C5  . BIOPSY  08/11/2015   Procedure: BIOPSY;  Surgeon: Daneil Dolin, MD;   [facility];  Service: Endoscopy;;  gastric polyp;  . cardiac cath     unremarkable  . CARPAL TUNNEL RELEASE Right 1995  . CARPAL TUNNEL RELEASE Right 1990's  . CHOLECYSTECTOMY  1996  . COLONOSCOPY  07/2007   Dr. Gala Romney: Terminal ileum normal. Status post segmental colonic mucosal biopsy.  . COLONOSCOPY WITH PROPOFOL N/A 08/11/2015   Procedure: COLONOSCOPY WITH PROPOFOL;  Surgeon: Daneil Dolin, MD;  [facility];  Service: Endoscopy;  Laterality: N/A;  130 - moved to 8:15, pt knows to arrive at 6:45  . ESOPHAGOGASTRODUODENOSCOPY  07/2007   Dr. Gala Romney: Small hiatal hernia, duodenal biopsy unremarkable. Fundic gland gastric polyps.  . ESOPHAGOGASTRODUODENOSCOPY (EGD) WITH PROPOFOL N/A 08/11/2015   Procedure: ESOPHAGOGASTRODUODENOSCOPY (EGD) WITH PROPOFOL;  Surgeon: Daneil Dolin, MD;  [facility];  Service: Endoscopy;  Laterality: N/A;  . KNEE ARTHROSCOPY WITH MEDIAL MENISECTOMY Right 05/25/2013   Procedure: RIGHT KNEE ARTHROSCOPY WITH PARTIAL LATERAL and MEDIAL MENISECTOMY AND CHONDROPLASTY;  Surgeon: Sydnee Cabal, MD;  Location: [place];  Service: Orthopedics;  Laterality: Right;  . LUMBAR DISC SURGERY  08-22-2003   LEFT  L5  --  S1  . MOHS SURGERY  01-05-2014   LEFT SIDE OF FACE  . SHOULDER ARTHROSCOPY WITH SUBACROMIAL DECOMPRESSION Left 01/29/2014   Procedure: LEFT SHOULDER ARTHROSCOPY WITH SUBACROMIAL DECOMPRESSION/DISTAL CLAVICLE RESECTION AND DEBRIDEMENT;  Surgeon: Sydnee Cabal, MD;  Location: [place];  Service: Orthopedics;  Laterality: Left;  . TOTAL ABDOMINAL HYSTERECTOMY W/ BILATERAL SALPINGOOPHORECTOMY  1999    Current Outpatient Medications  Medication Sig Dispense Refill  . ALPRAZolam (XANAX) 0.5 MG tablet Take 0.5 mg by mouth as needed.    . Cholecalciferol (VITAMIN D PO) Take by mouth daily.    . diazepam (VALIUM) 5 MG tablet Take 1 tablet by mouth every 8 (eight) hours as needed for anxiety.     .  hydrochlorothiazide (MICROZIDE) 12.5 MG capsule Take 12.5 mg by mouth every morning.     Marland Kitchen losartan (COZAAR) 100 MG tablet Take 100 mg by mouth every morning.     . mirtazapine (REMERON) 45 MG tablet Take 45 mg by mouth at bedtime. Alternates with Trazodone    . Multiple Vitamin (MULTIVITAMIN) tablet Take 1 tablet by mouth daily.    . RABEprazole (ACIPHEX) 20 MG tablet Take 1 tablet (20 mg total) 2 (two) times daily by mouth. 60 tablet 5  . sucralfate (CARAFATE) 1 g tablet Take 1 g by mouth as needed.     . dicyclomine (BENTYL) 20 MG tablet TAKE ONE TABLET BY MOUTH THREE TIMES DAILY BEFORE MEALS (Patient not taking: Reported on 02/18/2017) 90 tablet 1  . gabapentin (NEURONTIN) 300 MG capsule TAKE 1 CAPSULE BY MOUTH 2-3 TIMES DAILY AS DIRECTED (Patient not taking: Reported on 02/18/2017) 180 capsule 1  . HYDROcodone-acetaminophen (NORCO) 10-325 MG per tablet Take 1 tablet by mouth every 6 (six) hours as needed.     Marland Kitchen Hyoscyamine Sulfate SL (LEVSIN/SL) 0.125 MG SUBL Place 0.125 mg under the tongue 4 (four) times daily -  before meals and at bedtime. Prn (Patient not taking: Reported on 02/18/2017) 60 each 11  . traZODone (DESYREL) 50 MG tablet Take 50-100 mg by mouth at bedtime. Alternates with Remeron  0  . vitamin E 400 UNIT capsule Take 400 Units by mouth daily.     No current facility-administered medications for this visit.     Allergies as of 02/18/2017 - Review Complete 02/18/2017  Allergen Reaction Noted  . Aspirin Other (See Comments) 06/16/2015  . Celebrex [celecoxib] Hives 07/27/2012    Family History  Problem Relation Age of Onset  . Lung cancer Mother   . HIV/AIDS Father   . Crohn's disease Cousin   . Breast cancer Maternal Aunt   . Colon cancer Neg Hx     Social History   Socioeconomic History  . Marital status: Married    Spouse name: [name]  . Number of children: 3  . Years of education: GED  . Highest education level: None  Social Needs  . Financial resource  strain: None  . Food insecurity - worry: None  . Food insecurity - inability: None  . Transportation needs - medical: None  . Transportation needs - non-medical: None  Occupational History    Comment: not employed  Tobacco Use  . Smoking status: Former Smoker    Packs/day: 1.00    Years: 20.00    Pack years: 20.00    Types: Cigarettes    Last attempt to quit: 01/26/2015    Years since quitting: 2.0  . Smokeless tobacco: Never Used  Substance and Sexual Activity  . Alcohol use: No    Alcohol/week: 0.0 oz    Frequency: Never  . Drug use: No  . Sexual activity: None  Other Topics Concern  . None  Social History Narrative   Patient lives at home with her  husband [name]).    Patient is not working as time but she trying for disability.   Education 10th grade   Caffeine daily coffee and mountain dew . One can daily.   .       Review of Systems: Gen: Denies fever, chills, anorexia. Denies fatigue, weakness, weight loss.  CV: Denies chest pain, palpitations, syncope, peripheral edema, and claudication. Resp: Denies dyspnea at rest, cough, wheezing, coughing up blood, and pleurisy. GI: see HPI  Derm: Denies rash, itching, dry skin Psych: Denies depression, anxiety, memory loss, confusion. No homicidal or suicidal ideation.  Heme: Denies bruising, bleeding, and enlarged lymph nodes.  Physical Exam: BP (!) 167/94   Pulse (!) 104   Temp 97.9 F (36.6 C) (Oral)   Ht 5' 1.5" (1.562 m)   Wt 184 lb 3.2 oz (83.6 kg)   BMI 34.24 kg/m  General:   Alert and oriented. No distress noted. Pleasant and cooperative.  Head:  Normocephalic and atraumatic. Eyes:  Conjuctiva clear without scleral icterus. Mouth:  Oral mucosa pink and moist.  Abdomen:  +BS, soft, non-tender and non-distended. No rebound or guarding. No HSM or masses noted. Msk:  Symmetrical without gross deformities. Normal posture. Extremities:  Without edema. Neurologic:  Alert and  oriented x4 Psych:  Alert and  cooperative. Normal mood and affect.  Lab Results  Component Value Date   ALT 31 12/27/2016   AST 23 12/27/2016   ALKPHOS 160 (H) 12/27/2016   BILITOT 0.4 12/27/2016   Lab Results  Component Value Date   MITOAB 151.4 (H) 01/19/2017

## 2017-02-18 NOTE — Patient Instructions (Addendum)
For diarrhea: let's try a round of Xifaxan three times a day for only 2 weeks. I have sent this to the pharmacy. Hopefully, you may notice some improvement. If not, you can take Imodium as needed.  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 If your liver numbers do not improve, we will consider a liver biopsy. For now though, start taking the ursodiol 500 mg twice a day. We will recheck your liver numbers before you see [place] again in April.   I have ordered a special ultrasound of your liver to see the "stiffness" of your liver and assess for any concerns for early cirrhosis. If you have an advanced fibrosis score, we will be doing an ultrasound every 6 months and an upper endoscopy updated.   It is important that you take the supplemental calcium and Vitamin D (goal of 1000 to 1500 milligrams of calcium and 1000 iu Vit D daily).   I am glad you are seeing Dr. Gerarda Fraction in March. I am sure he will be ordering a TSH at that appointment. This needs to be done yearly. You also need a DEXA scan at a minimum of every 2 years.   We will see you in April!   It was a pleasure to see you today. I strive to create trusting relationships with patients to provide genuine, compassionate, and quality care. I value your feedback. If you receive a survey regarding your visit,  I greatly appreciate you the taking time to fill this out.   Annitta Needs, PhD, ANP-BC [facility]   Nonalcoholic Fatty Liver Disease Diet Nonalcoholic fatty liver disease is a condition that causes fat to accumulate in and around the liver. The disease makes it harder for the liver to work the way that it should. Following a healthy diet can help to keep nonalcoholic fatty liver disease under control. It can also help to prevent or improve conditions that are associated with the disease, such as heart disease, diabetes, high blood pressure, and abnormal  cholesterol levels. Along with regular exercise, this diet:  Promotes weight loss.  Helps to control blood sugar levels.  Helps to improve the way that the body uses insulin.  What do I need to know about this diet?  Use the glycemic index (GI) to plan your meals. The index tells you how quickly a food will raise your blood sugar. Choose low-GI foods. These foods take a longer time to raise blood sugar.  Keep track of how many calories you take in. Eating the right amount of calories will help you to achieve a healthy weight.  You may want to follow a Mediterranean diet. This diet includes a lot of vegetables, lean meats or fish, whole grains, fruits, and healthy oils and fats. What foods can I eat? Grains Whole grains, such as whole-wheat or whole-grain breads, crackers, tortillas, cereals, and pasta. Stone-ground whole wheat. Pumpernickel bread. Unsweetened oatmeal. Bulgur. Barley. Quinoa. Brown or wild rice. Corn or whole-wheat flour tortillas. Vegetables Lettuce. Spinach. Peas. Beets. Cauliflower. Cabbage. Broccoli. Carrots. Tomatoes. Squash. Eggplant. Herbs. Peppers. Onions. Cucumbers. Brussels sprouts. Yams and sweet potatoes. Beans. Lentils. Fruits Bananas. Apples. Oranges. Grapes. Papaya. Mango. Pomegranate. Kiwi. Grapefruit. Cherries. Meats and Other Protein Sources Seafood and shellfish. Lean meats. Poultry. Tofu. Dairy Low-fat or fat-free dairy products, such as yogurt, cottage cheese, and cheese. Beverages Water. Sugar-free drinks. Tea. Coffee. Low-fat or skim milk. Milk alternatives, such as soy  or almond milk. Real fruit juice. Condiments Mustard. Relish. Low-fat, low-sugar ketchup and barbecue sauce. Low-fat or fat-free mayonnaise. Sweets and Desserts Sugar-free sweets. Fats and Oils Avocado. Canola or olive oil. Nuts and nut butters. Seeds. The items listed above may not be a complete list of recommended foods or beverages. Contact your dietitian for more  options. What foods are not recommended? Palm oil and coconut oil. Processed foods. Fried foods. Sweetened drinks, such as sweet tea, milkshakes, snow cones, iced sweet drinks, and sodas. Alcohol. Sweets. Foods that contain a lot of salt or sodium. The items listed above may not be a complete list of foods and beverages to avoid. Contact your dietitian for more information. This information is not intended to replace advice given to you by your health care provider. Make sure you discuss any questions you have with your health care provider. Document Released: 06/04/2014 Document Revised: 06/26/2015 Document Reviewed: 02/12/2014 Elsevier Interactive Patient Education  [employer].

## 2017-02-22 ENCOUNTER — Telehealth: Payer: Self-pay | Admitting: Gastroenterology

## 2017-02-22 DIAGNOSIS — H5212 Myopia, left eye: Secondary | ICD-10-CM | POA: Diagnosis not present

## 2017-02-22 DIAGNOSIS — H2513 Age-related nuclear cataract, bilateral: Secondary | ICD-10-CM | POA: Diagnosis not present

## 2017-02-22 DIAGNOSIS — H1859 Other hereditary corneal dystrophies: Secondary | ICD-10-CM | POA: Diagnosis not present

## 2017-02-22 NOTE — Telephone Encounter (Signed)
Spoke with pt and the first u/s done 01/2017 was of the abdomen RUQ and the schedule u/s is for the liver. Pt is aware and ok with upcoming procedure.

## 2017-02-22 NOTE — Telephone Encounter (Signed)
[phone] PATIENT CALLED AND STATED SHE HAD AN ULTRASOUND A FEW WEEKS AGO AND WANTED TO KNOW IF THIS ONE THAT IS SCHEDULED IS SOMETHING DIFFERENT.  Laura [name] SCHEDULED HER FOR AN UPCOMING LIVER ULTRASOUND

## 2017-02-23 ENCOUNTER — Ambulatory Visit (HOSPITAL_COMMUNITY)
Admission: RE | Admit: 2017-02-23 | Discharge: 2017-02-23 | Disposition: A | Discharge status: Home / Self Care | Payer: PPO | Source: Ambulatory Visit | Attending: Gastroenterology | Admitting: Gastroenterology

## 2017-02-23 DIAGNOSIS — K740 Hepatic fibrosis: Secondary | ICD-10-CM | POA: Insufficient documentation

## 2017-02-23 DIAGNOSIS — K743 Primary biliary cirrhosis: Secondary | ICD-10-CM | POA: Insufficient documentation

## 2017-02-24 NOTE — Progress Notes (Signed)
cc'ed to pcp °

## 2017-02-24 NOTE — Assessment & Plan Note (Signed)
Thorough evaluation as noted in HPI. Two week course of Xifaxan for IBS-D prescribed.

## 2017-02-24 NOTE — Assessment & Plan Note (Signed)
62 year old female with history of elevated alk phos, with further serologies noting markedly elevated AMA. Today, she notes that her mom-s half sister and cousin both had PBC. Notes persistent fatigue, no pruritis. Most recent LFTs with isolated elevated alk phos at 160. AMA was over 150. 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 If evidence of F3/F4, would enroll in cirrhosis care to be proactive. EGD on file from 2017 but would consider updating this as screening maneuver. I also discussed starting on weight-based dosing of Urso, with serial monitoring of LFTs. Will hold on liver biopsy unless she does not have improvement or if worsening of LFTS/+/- abnormal transaminases. Currently, transaminases remain normal. Will repeat LFTs in 3 months. Elastography now. Discussed at least 1000 mg of calcium and 1000 iu Vit D daily. Will be seeing PCP soon, and she will request a TSH at that time along with a baseline DEXA scan. Return for close follow-up in April.

## 2017-02-28 ENCOUNTER — Telehealth: Payer: Self-pay | Admitting: Internal Medicine

## 2017-02-28 NOTE — Telephone Encounter (Signed)
Pt was calling to see if her U/S results were available. She had it done last week. Please call [phone]

## 2017-03-01 NOTE — Telephone Encounter (Signed)
Spoke with pt. Pt notified and will continue her diet. Pt will f/u at her next apt.

## 2017-03-10 ENCOUNTER — Other Ambulatory Visit: Payer: Self-pay | Admitting: *Deleted

## 2017-03-10 ENCOUNTER — Encounter: Payer: Self-pay | Admitting: *Deleted

## 2017-03-10 DIAGNOSIS — K743 Primary biliary cirrhosis: Secondary | ICD-10-CM

## 2017-03-13 ENCOUNTER — Other Ambulatory Visit: Payer: Self-pay | Admitting: Gastroenterology

## 2017-03-23 ENCOUNTER — Ambulatory Visit: Payer: PPO | Admitting: Gastroenterology

## 2017-03-28 ENCOUNTER — Other Ambulatory Visit: Payer: Self-pay | Admitting: Gastroenterology

## 2017-03-28 DIAGNOSIS — K743 Primary biliary cirrhosis: Secondary | ICD-10-CM | POA: Diagnosis not present

## 2017-03-29 LAB — HEPATIC FUNCTION PANEL
ALT: 18 IU/L (ref 0–32)
AST: 20 IU/L (ref 0–40)
Albumin: 4.6 g/dL (ref 3.6–4.8)
Alkaline Phosphatase: 169 IU/L — ABNORMAL HIGH (ref 39–117)
BILIRUBIN TOTAL: 0.3 mg/dL (ref 0.0–1.2)
BILIRUBIN, DIRECT: 0.1 mg/dL (ref 0.00–0.40)
Total Protein: 7.1 g/dL (ref 6.0–8.5)

## 2017-04-05 NOTE — Progress Notes (Signed)
Please have a repeat HFP in 2 months.

## 2017-04-06 ENCOUNTER — Other Ambulatory Visit: Payer: Self-pay

## 2017-04-06 DIAGNOSIS — R748 Abnormal levels of other serum enzymes: Secondary | ICD-10-CM

## 2017-04-06 DIAGNOSIS — D225 Melanocytic nevi of trunk: Secondary | ICD-10-CM | POA: Diagnosis not present

## 2017-04-06 DIAGNOSIS — Z08 Encounter for follow-up examination after completed treatment for malignant neoplasm: Secondary | ICD-10-CM | POA: Diagnosis not present

## 2017-04-06 DIAGNOSIS — Z1283 Encounter for screening for malignant neoplasm of skin: Secondary | ICD-10-CM | POA: Diagnosis not present

## 2017-04-06 DIAGNOSIS — Z85820 Personal history of malignant melanoma of skin: Secondary | ICD-10-CM | POA: Diagnosis not present

## 2017-04-06 DIAGNOSIS — L820 Inflamed seborrheic keratosis: Secondary | ICD-10-CM | POA: Diagnosis not present

## 2017-04-06 DIAGNOSIS — D485 Neoplasm of uncertain behavior of skin: Secondary | ICD-10-CM | POA: Diagnosis not present

## 2017-04-08 ENCOUNTER — Encounter: Payer: Self-pay | Admitting: Rheumatology

## 2017-04-08 DIAGNOSIS — L853 Xerosis cutis: Secondary | ICD-10-CM | POA: Diagnosis not present

## 2017-04-08 DIAGNOSIS — G894 Chronic pain syndrome: Secondary | ICD-10-CM | POA: Diagnosis not present

## 2017-04-08 DIAGNOSIS — E559 Vitamin D deficiency, unspecified: Secondary | ICD-10-CM | POA: Diagnosis not present

## 2017-04-08 DIAGNOSIS — M5030 Other cervical disc degeneration, unspecified cervical region: Secondary | ICD-10-CM | POA: Diagnosis not present

## 2017-04-08 DIAGNOSIS — Z6830 Body mass index (BMI) 30.0-30.9, adult: Secondary | ICD-10-CM | POA: Diagnosis not present

## 2017-04-08 DIAGNOSIS — Z0001 Encounter for general adult medical examination with abnormal findings: Secondary | ICD-10-CM | POA: Diagnosis not present

## 2017-04-08 DIAGNOSIS — R682 Dry mouth, unspecified: Secondary | ICD-10-CM | POA: Diagnosis not present

## 2017-04-08 DIAGNOSIS — F330 Major depressive disorder, recurrent, mild: Secondary | ICD-10-CM | POA: Diagnosis not present

## 2017-04-08 DIAGNOSIS — Z1389 Encounter for screening for other disorder: Secondary | ICD-10-CM | POA: Diagnosis not present

## 2017-04-08 DIAGNOSIS — G64 Other disorders of peripheral nervous system: Secondary | ICD-10-CM | POA: Diagnosis not present

## 2017-04-08 DIAGNOSIS — E6609 Other obesity due to excess calories: Secondary | ICD-10-CM | POA: Diagnosis not present

## 2017-04-08 DIAGNOSIS — G4700 Insomnia, unspecified: Secondary | ICD-10-CM | POA: Diagnosis not present

## 2017-04-08 DIAGNOSIS — M3500 Sicca syndrome, unspecified: Secondary | ICD-10-CM | POA: Diagnosis not present

## 2017-04-08 DIAGNOSIS — E663 Overweight: Secondary | ICD-10-CM | POA: Diagnosis not present

## 2017-04-20 DIAGNOSIS — D485 Neoplasm of uncertain behavior of skin: Secondary | ICD-10-CM | POA: Diagnosis not present

## 2017-04-20 DIAGNOSIS — L905 Scar conditions and fibrosis of skin: Secondary | ICD-10-CM | POA: Diagnosis not present

## 2017-05-06 ENCOUNTER — Other Ambulatory Visit: Payer: Self-pay

## 2017-05-06 DIAGNOSIS — R748 Abnormal levels of other serum enzymes: Secondary | ICD-10-CM

## 2017-05-23 DIAGNOSIS — R748 Abnormal levels of other serum enzymes: Secondary | ICD-10-CM | POA: Diagnosis not present

## 2017-05-24 LAB — HEPATIC FUNCTION PANEL
ALBUMIN: 4.4 g/dL (ref 3.6–4.8)
ALT: 15 IU/L (ref 0–32)
AST: 13 IU/L (ref 0–40)
Alkaline Phosphatase: 141 IU/L — ABNORMAL HIGH (ref 39–117)
BILIRUBIN TOTAL: 0.5 mg/dL (ref 0.0–1.2)
BILIRUBIN, DIRECT: 0.13 mg/dL (ref 0.00–0.40)
TOTAL PROTEIN: 6.8 g/dL (ref 6.0–8.5)

## 2017-05-24 NOTE — Progress Notes (Signed)
Stacey: can we make sure she has an appt upcoming?    Sent in Mychart: Your liver number is improving! We will recheck this in a few months. I am making sure you have an appointment upcoming! Stacey: can we make sure she has an appt upcoming?

## 2017-05-25 ENCOUNTER — Encounter: Payer: Self-pay | Admitting: Internal Medicine

## 2017-05-25 NOTE — Progress Notes (Signed)
PATIENT SCHEDULED  °

## 2017-06-01 NOTE — Progress Notes (Signed)
Office Visit Note  Patient: Laura [name]             Date of Birth: [birth year]           MRN: [id number]             PCP: Redmond School, MD Referring: Allyn Kenner, MD Visit Date: 06/15/2017 Occupation: Disability    Subjective:  Positive ANA.   History of Present Illness: Laura [name] is a 62 y.o. female seen in consultation per request of her dermatologist.  According to patient she had melanoma on her face in 2014.  She went for a follow-up visit with her dermatologist.  She states that the dermatologist noticed dry skin.  On questioning patient explained that she also had dry mouth and dry eyes.  She had some labs done which was positive for ANA.  SSA and SSB were negative.  She was referred to me for further evaluation of possible Sjogren's.  She had history of dry mouth for many years.  She related to her medications.  Dry eyes were diagnosed by her ophthalmologist about any year ago.  She has been taking oxycodone as needed for lower extremity discomfort due to neuropathy.  She states she was diagnosed with neuropathy about 5 years ago.  There is family history of neuropathy.  She also has anxiety for which she has been taking Valium at nighttime.  She also has a prescription for Xanax which she alternate with Valium.  Activities of Daily Living:  Patient reports morning stiffness for 1 hour.   Patient Denies nocturnal pain.  Difficulty dressing/grooming: Denies Difficulty climbing stairs: Reports Difficulty getting out of chair: Denies Difficulty using hands for taps, buttons, cutlery, and/or writing: Denies   Review of Systems  Constitutional: Positive for fatigue. Negative for night sweats, weight gain and weight loss.  HENT: Positive for mouth dryness. Negative for mouth sores, trouble swallowing, trouble swallowing and nose dryness.   Eyes: Positive for dryness. Negative for pain, redness and visual disturbance.  Respiratory: Negative for cough, shortness of breath and  difficulty breathing.   Cardiovascular: Negative for chest pain, palpitations, hypertension, irregular heartbeat and swelling in legs/feet.  Gastrointestinal: Positive for diarrhea. Negative for blood in stool and constipation.       Better on meds  Endocrine: Negative for increased urination.  Genitourinary: Negative for vaginal dryness.  Musculoskeletal: Positive for arthralgias, joint pain, myalgias, morning stiffness and myalgias. Negative for joint swelling, muscle weakness and muscle tenderness.  Skin: Negative for color change, rash, hair loss, skin tightness, ulcers and sensitivity to sunlight.  Allergic/Immunologic: Negative for susceptible to infections.  Neurological: Positive for parasthesias. Negative for dizziness, memory loss, night sweats and weakness.  Hematological: Negative for swollen glands.  Psychiatric/Behavioral: Positive for sleep disturbance. Negative for depressed mood. The patient is nervous/anxious.     PMFS History:  Patient Active Problem List   Diagnosis Date Noted  . Former smoker 06/15/2017  . Essential hypertension 06/15/2017  . DDD (degenerative disc disease), cervical 06/15/2017  . DDD (degenerative disc disease), lumbar 06/15/2017  . 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 Chronic diarrhea   . Reflux esophagitis   . Gastric polyp   . Upper airway cough syndrome 08/07/2015  . Chronic respiratory failure with hypoxia (HCC)/noct hypoxemia 08/07/2015  . GERD (gastroesophageal reflux disease) 07/07/2015  . Diarrhea 07/07/2015  . Hepatitis B antibody positive 07/07/2015  . Loss of weight 07/07/2015  . Paresthesia 02/27/2015  . S/P arthroscopy of shoulder 01/29/2014  .  S/P right knee arthroscopy 05/25/2013  . Hereditary and idiopathic peripheral neuropathy 07/27/2012    Past Medical History:  Diagnosis Date  . Acromioclavicular joint arthritis    left shoulder  . Anxiety   . Cancer ([place])    skin (facial)   . GERD (gastroesophageal  reflux disease)   . H/O hiatal hernia   . Hepatitis B antibody positive    letter from [facility], Hep B core total ab positive  . History of melanoma excision    01-05-2014  . Hypertension   . IBS (irritable bowel syndrome)   . Nocturnal hypoxia   . OA (osteoarthritis) of knee    RIGHT  . RLS (restless legs syndrome)   . Rotator cuff impingement syndrome of left shoulder   . Shortness of breath dyspnea   . Unspecified hereditary and idiopathic peripheral neuropathy     Family History  Problem Relation Age of Onset  . Lung cancer Mother   . HIV/AIDS Father   . Crohn's disease Cousin   . Breast cancer Maternal Aunt   . Sjogren's syndrome Sister   . Diabetes Brother   . Heart attack Daughter   . Drug abuse Son   . ADD / ADHD Grandchild   . Colon cancer Neg Hx    Past Surgical History:  Procedure Laterality Date  . ABDOMINAL HYSTERECTOMY    . ANTERIOR CERVICAL DECOMP/DISCECTOMY FUSION  2000   C4 -- C5  . BIOPSY  08/11/2015   Procedure: BIOPSY;  Surgeon: Daneil Dolin, MD;  [facility];  Service: Endoscopy;;  gastric polyp;  . cardiac cath     unremarkable  . CARPAL TUNNEL RELEASE Right 1995  . CHOLECYSTECTOMY  1996  . COLONOSCOPY  07/2007   Dr. Gala Romney: Terminal ileum normal. Status post segmental colonic mucosal biopsy.  . COLONOSCOPY WITH PROPOFOL N/A 08/11/2015   Procedure: COLONOSCOPY WITH PROPOFOL;  Surgeon: Daneil Dolin, MD;  [facility];  Service: Endoscopy;  Laterality: N/A;  130 - moved to 8:15, pt knows to arrive at 6:45  . ESOPHAGOGASTRODUODENOSCOPY  07/2007   Dr. Gala Romney: Small hiatal hernia, duodenal biopsy unremarkable. Fundic gland gastric polyps.  . ESOPHAGOGASTRODUODENOSCOPY (EGD) WITH PROPOFOL N/A 08/11/2015   Procedure: ESOPHAGOGASTRODUODENOSCOPY (EGD) WITH PROPOFOL;  Surgeon: Daneil Dolin, MD;  [facility];  Service: Endoscopy;  Laterality: N/A;  . KNEE ARTHROSCOPY WITH MEDIAL MENISECTOMY Right 05/25/2013   Procedure:  RIGHT KNEE ARTHROSCOPY WITH PARTIAL LATERAL and MEDIAL MENISECTOMY AND CHONDROPLASTY;  Surgeon: Sydnee Cabal, MD;  Location: [facility];  Service: Orthopedics;  Laterality: Right;  . LUMBAR DISC SURGERY  08-22-2003   LEFT  L5  --  S1  . MOHS SURGERY  01-05-2014   LEFT SIDE OF FACE  . SHOULDER ARTHROSCOPY WITH SUBACROMIAL DECOMPRESSION Left 01/29/2014   Procedure: LEFT SHOULDER ARTHROSCOPY WITH SUBACROMIAL DECOMPRESSION/DISTAL CLAVICLE RESECTION AND DEBRIDEMENT;  Surgeon: Sydnee Cabal, MD;  Location: [facility];  Service: Orthopedics;  Laterality: Left;  . TOTAL ABDOMINAL HYSTERECTOMY W/ BILATERAL SALPINGOOPHORECTOMY  1999   Social History   Social History Narrative   Patient lives at home with her husband [name]).    Patient is not working as time but she trying for disability.   Education 10th grade   Caffeine daily coffee and mountain dew . One can daily.   .        Objective: Vital Signs: BP (!) 146/94 (BP Location: Left Arm, Patient Position: Sitting, Cuff Size: Normal)  Pulse 90   Resp 15   Ht 5' 1.42" (1.56 m)   Wt 172 lb 8 oz (78.2 kg)   BMI 32.15 kg/m    Physical Exam  Constitutional: She is oriented to person, place, and time. She appears well-developed and well-nourished.  HENT:  Head: Normocephalic and atraumatic.  Eyes: Conjunctivae and EOM are normal.  Neck: Normal range of motion.  Cardiovascular: Normal rate, regular rhythm, normal heart sounds and intact distal pulses.  Pulmonary/Chest: Effort normal and breath sounds normal.  Abdominal: Soft. Bowel sounds are normal.  Lymphadenopathy:    She has no cervical adenopathy.  Neurological: She is alert and oriented to person, place, and time.  Skin: Skin is warm and dry. Capillary refill takes less than 2 seconds.  Psychiatric: She has a normal mood and affect. Her behavior is normal.  Nursing note and vitals reviewed.    Musculoskeletal Exam: She has limited range of  motion of her C-spine lumbar spine due to effusion.  Shoulder joints elbow joints wrist joints with good range of motion.  She has some DIP PIP thickening in her hands consistent with osteoarthritis.  Hip joints knee joints ankles MTPs PIPs were in good range of motion with no synovitis.  CDAI Exam: No CDAI exam completed.    Investigation: No additional findings. CBC Latest Ref Rng & Units 08/07/2015 08/04/2015 01/29/2014  WBC 4.0 - 10.5 K/uL 7.9 5.9 -  Hemoglobin 12.0 - 15.0 g/dL 14.6 14.6 13.6  Hematocrit 36.0 - 46.0 % 42.3 42.4 40.0  Platelets 150.0 - 400.0 K/uL 304.0 277 -   CMP Latest Ref Rng & Units 05/23/2017 03/28/2017 02/03/2017  Glucose 65 - 99 mg/dL - - -  BUN 6 - 20 mg/dL - - -  Creatinine 0.44 - 1.00 mg/dL - - 0.90  Sodium 135 - 145 mmol/L - - -  Potassium 3.5 - 5.1 mmol/L - - -  Chloride 101 - 111 mmol/L - - -  CO2 22 - 32 mmol/L - - -  Calcium 8.9 - 10.3 mg/dL - - -  Total Protein 6.0 - 8.5 g/dL 6.8 7.1 -  Total Bilirubin 0.0 - 1.2 mg/dL 0.5 0.3 -  Alkaline Phos 39 - 117 IU/L 141(H) 169(H) -  AST 0 - 40 IU/L 13 20 -  ALT 0 - 32 IU/L 15 18 -   ANA 1: 160 nuclear, (SSA, SSB, anti-DNA, RNP, Smith, Jo 1-), ESR 2, CRP 2.7 Imaging: No results found.  Speciality Comments: No specialty comments available.    Procedures:  No procedures performed Allergies: Aspirin and Celebrex [celecoxib]   Assessment / Plan:     Visit Diagnoses: Positive ANA (antinuclear antibody) - ANA 1:160 Nuclear dot patternENA panel -, Sed rate 2, CRP 2.7.  Patient gives history of sicca symptoms which include dry mouth dry eyes and dry skin.  Dry skin was noted by her dermatologist.  She states her symptoms are manageable and not interfering with her activities.  We had detailed discussion regarding taking more fluids, using eyedrops and moisturizing lotion.  At this point no further evaluation is needed.  I believe most of her sicca symptoms are related to medications.  She is on Xanax, Valium and  oxycodone.  Use of Biotene products, over-the-counter eyedrops, use of omega-3 were also discussed.  S/P arthroscopy of right shoulder-chronic pain  S/P right knee arthroscopy-chronic pain  DDD (degenerative disc disease), cervical - s/p fusion-chronic pain  DDD (degenerative disc disease), lumbar - s/p fusion-she continues to have  lower back discomfort.  Hereditary and idiopathic peripheral neuropathy-she has family history of neuropathy.  Patient states she was seen by neurologist in the past and had a evaluation.  She was tried on gabapentin but she could not tolerate the medication.    History of melanoma -on left side of her face 2014  Other medical problems are listed as follows:  GERD with esophagitis  Chronic diarrhea  Hepatitis B antibody positive  Primary biliary cholangitis ([place])  Essential hypertension  Former smoker  Fatty liver    Orders: No orders of the defined types were placed in this encounter.  No orders of the defined types were placed in this encounter.   Face-to-face time spent with patient was 45 minutes.> 50% of time was spent in counseling and coordination of care.  Patient will notify me in case she develops any worsening of her symptoms.  Follow-Up Instructions: Return in about 1 year (around 06/16/2018) for sicca.   Bo Merino, MD  Note - This record has been created using Editor, commissioning.  Chart creation errors have been sought, but may not always  have been located. Such creation errors do not reflect on  the standard of medical care.

## 2017-06-09 ENCOUNTER — Other Ambulatory Visit: Payer: Self-pay | Admitting: Gastroenterology

## 2017-06-09 ENCOUNTER — Other Ambulatory Visit: Payer: Self-pay | Admitting: Nurse Practitioner

## 2017-06-15 ENCOUNTER — Encounter: Payer: Self-pay | Admitting: Rheumatology

## 2017-06-15 ENCOUNTER — Ambulatory Visit: Payer: PPO | Admitting: Rheumatology

## 2017-06-15 VITALS — BP 146/94 | HR 90 | Resp 15 | Ht 61.42 in | Wt 172.5 lb

## 2017-06-15 DIAGNOSIS — K743 Primary biliary cirrhosis: Secondary | ICD-10-CM | POA: Diagnosis not present

## 2017-06-15 DIAGNOSIS — Z9889 Other specified postprocedural states: Secondary | ICD-10-CM

## 2017-06-15 DIAGNOSIS — I10 Essential (primary) hypertension: Secondary | ICD-10-CM | POA: Diagnosis not present

## 2017-06-15 DIAGNOSIS — M5030 Other cervical disc degeneration, unspecified cervical region: Secondary | ICD-10-CM | POA: Diagnosis not present

## 2017-06-15 DIAGNOSIS — Z85820 Personal history of malignant melanoma of skin: Secondary | ICD-10-CM | POA: Diagnosis not present

## 2017-06-15 DIAGNOSIS — K760 Fatty (change of) liver, not elsewhere classified: Secondary | ICD-10-CM

## 2017-06-15 DIAGNOSIS — K2100 Gastro-esophageal reflux disease with esophagitis, without bleeding: Secondary | ICD-10-CM

## 2017-06-15 DIAGNOSIS — M5136 Other intervertebral disc degeneration, lumbar region: Secondary | ICD-10-CM | POA: Insufficient documentation

## 2017-06-15 DIAGNOSIS — G609 Hereditary and idiopathic neuropathy, unspecified: Secondary | ICD-10-CM

## 2017-06-15 DIAGNOSIS — R768 Other specified abnormal immunological findings in serum: Secondary | ICD-10-CM | POA: Diagnosis not present

## 2017-06-15 DIAGNOSIS — K529 Noninfective gastroenteritis and colitis, unspecified: Secondary | ICD-10-CM

## 2017-06-15 DIAGNOSIS — Z87891 Personal history of nicotine dependence: Secondary | ICD-10-CM

## 2017-07-14 ENCOUNTER — Ambulatory Visit: Payer: BLUE CROSS/BLUE SHIELD | Admitting: Rheumatology

## 2017-07-21 DIAGNOSIS — K219 Gastro-esophageal reflux disease without esophagitis: Secondary | ICD-10-CM | POA: Diagnosis not present

## 2017-07-21 DIAGNOSIS — Z6831 Body mass index (BMI) 31.0-31.9, adult: Secondary | ICD-10-CM | POA: Diagnosis not present

## 2017-07-21 DIAGNOSIS — K743 Primary biliary cirrhosis: Secondary | ICD-10-CM | POA: Diagnosis not present

## 2017-07-21 DIAGNOSIS — I10 Essential (primary) hypertension: Secondary | ICD-10-CM | POA: Diagnosis not present

## 2017-07-21 DIAGNOSIS — G894 Chronic pain syndrome: Secondary | ICD-10-CM | POA: Diagnosis not present

## 2017-08-19 ENCOUNTER — Encounter: Payer: Self-pay | Admitting: *Deleted

## 2017-08-19 ENCOUNTER — Ambulatory Visit (INDEPENDENT_AMBULATORY_CARE_PROVIDER_SITE_OTHER): Payer: PPO | Admitting: Gastroenterology

## 2017-08-19 ENCOUNTER — Other Ambulatory Visit: Payer: Self-pay | Admitting: *Deleted

## 2017-08-19 VITALS — BP 129/90 | HR 102 | Temp 97.7°F | Ht 61.5 in | Wt 173.4 lb

## 2017-08-19 DIAGNOSIS — K743 Primary biliary cirrhosis: Secondary | ICD-10-CM

## 2017-08-19 NOTE — Assessment & Plan Note (Signed)
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 Transaminases remaining normal. Fibrosis score F0/F1 on elastography. Feels much improved from last visit.   Check HFP now Calcium and Vit D supplementation discussed Ordering DEXA scan Continue Urso BID Return in 6 months

## 2017-08-19 NOTE — Patient Instructions (Addendum)
Continue Urso twice a day.  Please have blood work done when you are able.   We are ordering a DEXA scan. You will need this every 2 years.   It is good to take supplemental calcium and Vitamin D (goal of 1000 to 1500 milligrams of calcium and 1000 iu Vit D daily).   We will see you in 6 months!  It was a pleasure to see you today. I strive to create trusting relationships with patients to provide genuine, compassionate, and quality care. I value your feedback. If you receive a survey regarding your visit,  I greatly appreciate you taking time to fill this out.   Annitta Needs, PhD, ANP-BC [facility]

## 2017-08-19 NOTE — Progress Notes (Signed)
Referring Provider: Redmond School, MD Primary Care Physician:  Redmond School, MD Primary GI: Dr. Gala Romney   Chief Complaint  Patient presents with  . Diarrhea    some better     HPI:   Laura [name] is a 62 y.o. female presenting today with a history of chronic diarrhea, previously evaluated extensively and worked up for microscopic colitis and celiac disease. Ileocolonoscopy in July 2017 negative for microscopic colitis. Felt to have IBS-D. Hep B core antibody positive in past but felt to be a false positive. Hep C antibody negative.    Elevated alk phos: AMA markedly positive at 151. Felt to likely have [place]. [place] on file from Dec 2018 with fatty liver or hepatocellular disease. Started on urso and arranged elastography: this was minimal fibrosis with F0/F1.   Recommended 1000 mg calcium and 1000 iu Vit D daily. Needs DEXA. Holding on liver biopsy unless worsening of LFTs +/- abnormal transaminases. Last labs in April with improved alk Phos at 141.   Purposefully working on weight loss. Some days doesn't even have diarrhea. Was given course of Xifaxan but insurance did not cover this. Not taking anything for diarrhea and feels her diet has played a big role in improvement.   Past Medical History:  Diagnosis Date  . Acromioclavicular joint arthritis    left shoulder  . Anxiety   . Cancer ([place])    skin (facial)   . GERD (gastroesophageal reflux disease)   . H/O hiatal hernia   . Hepatitis B antibody positive    letter from red cross, Hep B core total ab positive  . History of melanoma excision    01-05-2014  . Hypertension   . IBS (irritable bowel syndrome)   . Nocturnal hypoxia   . OA (osteoarthritis) of knee    RIGHT  . RLS (restless legs syndrome)   . Rotator cuff impingement syndrome of left shoulder   . Shortness of breath dyspnea   . Unspecified hereditary and idiopathic peripheral neuropathy     Past Surgical History:  Procedure Laterality Date  . ABDOMINAL  HYSTERECTOMY    . ANTERIOR CERVICAL DECOMP/DISCECTOMY FUSION  2000   C4 -- C5  . BIOPSY  08/11/2015   Procedure: BIOPSY;  Surgeon: Daneil Dolin, MD;  [facility];  Service: Endoscopy;;  gastric polyp;  . cardiac cath     unremarkable  . CARPAL TUNNEL RELEASE Right 1995  . CHOLECYSTECTOMY  1996  . COLONOSCOPY  07/2007   Dr. Gala Romney: Terminal ileum normal. Status post segmental colonic mucosal biopsy.  . COLONOSCOPY WITH PROPOFOL N/A 08/11/2015   Procedure: COLONOSCOPY WITH PROPOFOL;  Surgeon: Daneil Dolin, MD;  [facility];  Service: Endoscopy;  Laterality: N/A;  130 - moved to 8:15, pt knows to arrive at 6:45  . ESOPHAGOGASTRODUODENOSCOPY  07/2007   Dr. Gala Romney: Small hiatal hernia, duodenal biopsy unremarkable. Fundic gland gastric polyps.  . ESOPHAGOGASTRODUODENOSCOPY (EGD) WITH PROPOFOL N/A 08/11/2015   Procedure: ESOPHAGOGASTRODUODENOSCOPY (EGD) WITH PROPOFOL;  Surgeon: Daneil Dolin, MD;  [facility];  Service: Endoscopy;  Laterality: N/A;  . KNEE ARTHROSCOPY WITH MEDIAL MENISECTOMY Right 05/25/2013   Procedure: RIGHT KNEE ARTHROSCOPY WITH PARTIAL LATERAL and MEDIAL MENISECTOMY AND CHONDROPLASTY;  Surgeon: Sydnee Cabal, MD;  Location: [place];  Service: Orthopedics;  Laterality: Right;  . LUMBAR DISC SURGERY  08-22-2003   LEFT  L5  --  S1  . MOHS SURGERY  01-05-2014   LEFT SIDE  OF FACE  . SHOULDER ARTHROSCOPY WITH SUBACROMIAL DECOMPRESSION Left 01/29/2014   Procedure: LEFT SHOULDER ARTHROSCOPY WITH SUBACROMIAL DECOMPRESSION/DISTAL CLAVICLE RESECTION AND DEBRIDEMENT;  Surgeon: Sydnee Cabal, MD;  Location: [facility];  Service: Orthopedics;  Laterality: Left;  . TOTAL ABDOMINAL HYSTERECTOMY W/ BILATERAL SALPINGOOPHORECTOMY  1999    Current Outpatient Medications  Medication Sig Dispense Refill  . ALPRAZolam (XANAX) 0.5 MG tablet Take 0.5 mg by mouth as needed.    . diazepam (VALIUM) 5 MG tablet Take 1 tablet by  mouth every 8 (eight) hours as needed for anxiety.     . hydrochlorothiazide (MICROZIDE) 12.5 MG capsule Take 12.5 mg by mouth every morning.     Marland Kitchen losartan (COZAAR) 100 MG tablet Take 100 mg by mouth every morning.     . mirtazapine (REMERON) 45 MG tablet Take 45 mg by mouth at bedtime. Alternates with Trazodone    . Oxycodone HCl 10 MG TABS Take 10 mg by mouth every 4 (four) hours as needed.  0  . RABEprazole (ACIPHEX) 20 MG tablet TAKE ONE TABLET BY MOUTH TWICE DAILY 60 tablet 5  . sucralfate (CARAFATE) 1 g tablet Take 1 g by mouth as needed.     . ursodiol (ACTIGALL) 500 MG tablet TAKE 1 TABLET BY MOUTH TWICE DAILY 60 tablet 5  . Cholecalciferol (VITAMIN D PO) Take by mouth.     . dicyclomine (BENTYL) 20 MG tablet TAKE ONE TABLET BY MOUTH THREE TIMES DAILY BEFORE MEALS (Patient not taking: Reported on 06/15/2017) 90 tablet 1  . Multiple Vitamin (MULTIVITAMIN) tablet Take 1 tablet by mouth daily.     No current facility-administered medications for this visit.     Allergies as of 08/19/2017 - Review Complete 08/19/2017  Allergen Reaction Noted  . Aspirin Other (See Comments) 06/16/2015  . Celebrex [celecoxib] Hives 07/27/2012    Family History  Problem Relation Age of Onset  . Lung cancer Mother   . HIV/AIDS Father   . Crohn's disease Cousin   . Breast cancer Maternal Aunt   . Sjogren's syndrome Sister   . Diabetes Brother   . Heart attack Daughter   . Drug abuse Son   . ADD / ADHD Grandchild   . Colon cancer Neg Hx     Social History   Socioeconomic History  . Marital status: Married    Spouse name: [name]  . Number of children: 3  . Years of education: GED  . Highest education level: Not on file  Occupational History    Comment: not employed  Social Needs  . Financial resource strain: Not on file  . Food insecurity:    Worry: Not on file    Inability: Not on file  . Transportation needs:    Medical: Not on file    Non-medical: Not on file  Tobacco Use  .  Smoking status: Former Smoker    Packs/day: 1.00    Years: 20.00    Pack years: 20.00    Types: Cigarettes    Last attempt to quit: 01/26/2015    Years since quitting: 2.5  . Smokeless tobacco: Never Used  Substance and Sexual Activity  . Alcohol use: No    Alcohol/week: 0.0 oz    Frequency: Never  . Drug use: No  . Sexual activity: Not on file  Lifestyle  . Physical activity:    Days per week: Not on file    Minutes per session: Not on file  . Stress: Not on file  Relationships  . Social connections:    Talks on phone: Not on file    Gets together: Not on file    Attends religious service: Not on file    Active member of club or organization: Not on file    Attends meetings of clubs or organizations: Not on file    Relationship status: Not on file  Other Topics Concern  . Not on file  Social History Narrative   Patient lives at home with her husband [name]).    Patient is not working as time but she trying for disability.   Education 10th grade   Caffeine daily coffee and mountain dew . One can daily.   .       Review of Systems: Gen: Denies fever, chills, anorexia. Denies fatigue, weakness, weight loss.  CV: Denies chest pain, palpitations, syncope, peripheral edema, and claudication. Resp: Denies dyspnea at rest, cough, wheezing, coughing up blood, and pleurisy. GI: see HPI  Derm: Denies rash, itching, dry skin Psych: Denies depression, anxiety, memory loss, confusion. No homicidal or suicidal ideation.  Heme: Denies bruising, bleeding, and enlarged lymph nodes.  Physical Exam: BP 129/90   Pulse (!) 102   Temp 97.7 F (36.5 C) (Oral)   Ht 5' 1.5" (1.562 m)   Wt 173 lb 6.4 oz (78.7 kg)   BMI 32.23 kg/m  General:   Alert and oriented. No distress noted. Pleasant and cooperative.  Head:  Normocephalic and atraumatic. Eyes:  Conjuctiva clear without scleral icterus. Mouth:  Oral mucosa pink and moist.  Abdomen:  +BS, soft, non-tender and non-distended. No  rebound or guarding. No HSM or masses noted. Msk:  Symmetrical without gross deformities. Normal posture. Extremities:  Without edema. Neurologic:  Alert and  oriented x4 Psych:  Alert and cooperative. Normal mood and affect.

## 2017-08-22 NOTE — Progress Notes (Signed)
cc'ed to pcp °

## 2017-08-29 ENCOUNTER — Other Ambulatory Visit (HOSPITAL_COMMUNITY): Payer: PPO

## 2017-09-30 DIAGNOSIS — K743 Primary biliary cirrhosis: Secondary | ICD-10-CM | POA: Diagnosis not present

## 2017-10-01 LAB — HEPATIC FUNCTION PANEL
ALT: 24 IU/L (ref 0–32)
AST: 18 IU/L (ref 0–40)
Albumin: 4.2 g/dL (ref 3.6–4.8)
Alkaline Phosphatase: 117 IU/L (ref 39–117)
BILIRUBIN TOTAL: 0.4 mg/dL (ref 0.0–1.2)
BILIRUBIN, DIRECT: 0.1 mg/dL (ref 0.00–0.40)
TOTAL PROTEIN: 6.8 g/dL (ref 6.0–8.5)

## 2017-10-05 NOTE — Progress Notes (Signed)
Laura [name]: your liver numbers are completely normal! Continue taking the medication you are prescribed. Hope you are doing well! (sent in [place])

## 2017-10-13 DIAGNOSIS — K219 Gastro-esophageal reflux disease without esophagitis: Secondary | ICD-10-CM | POA: Diagnosis not present

## 2017-10-13 DIAGNOSIS — I10 Essential (primary) hypertension: Secondary | ICD-10-CM | POA: Diagnosis not present

## 2017-10-13 DIAGNOSIS — Z1389 Encounter for screening for other disorder: Secondary | ICD-10-CM | POA: Diagnosis not present

## 2017-10-13 DIAGNOSIS — E6609 Other obesity due to excess calories: Secondary | ICD-10-CM | POA: Diagnosis not present

## 2017-10-13 DIAGNOSIS — Z6831 Body mass index (BMI) 31.0-31.9, adult: Secondary | ICD-10-CM | POA: Diagnosis not present

## 2017-10-13 DIAGNOSIS — G894 Chronic pain syndrome: Secondary | ICD-10-CM | POA: Diagnosis not present

## 2017-10-13 DIAGNOSIS — M47812 Spondylosis without myelopathy or radiculopathy, cervical region: Secondary | ICD-10-CM | POA: Diagnosis not present

## 2017-10-13 DIAGNOSIS — F419 Anxiety disorder, unspecified: Secondary | ICD-10-CM | POA: Diagnosis not present

## 2017-10-17 DIAGNOSIS — Z1231 Encounter for screening mammogram for malignant neoplasm of breast: Secondary | ICD-10-CM | POA: Diagnosis not present

## 2017-11-09 DIAGNOSIS — Z85820 Personal history of malignant melanoma of skin: Secondary | ICD-10-CM | POA: Diagnosis not present

## 2017-11-09 DIAGNOSIS — Z1283 Encounter for screening for malignant neoplasm of skin: Secondary | ICD-10-CM | POA: Diagnosis not present

## 2017-11-09 DIAGNOSIS — Z08 Encounter for follow-up examination after completed treatment for malignant neoplasm: Secondary | ICD-10-CM | POA: Diagnosis not present

## 2017-11-09 DIAGNOSIS — D225 Melanocytic nevi of trunk: Secondary | ICD-10-CM | POA: Diagnosis not present

## 2017-11-28 ENCOUNTER — Other Ambulatory Visit: Payer: Self-pay | Admitting: Gastroenterology

## 2017-11-29 DIAGNOSIS — Z23 Encounter for immunization: Secondary | ICD-10-CM | POA: Diagnosis not present

## 2018-01-02 DIAGNOSIS — E6609 Other obesity due to excess calories: Secondary | ICD-10-CM | POA: Diagnosis not present

## 2018-01-02 DIAGNOSIS — Z6832 Body mass index (BMI) 32.0-32.9, adult: Secondary | ICD-10-CM | POA: Diagnosis not present

## 2018-01-02 DIAGNOSIS — F419 Anxiety disorder, unspecified: Secondary | ICD-10-CM | POA: Diagnosis not present

## 2018-01-02 DIAGNOSIS — G894 Chronic pain syndrome: Secondary | ICD-10-CM | POA: Diagnosis not present

## 2018-01-02 DIAGNOSIS — G629 Polyneuropathy, unspecified: Secondary | ICD-10-CM | POA: Diagnosis not present

## 2018-01-02 DIAGNOSIS — K219 Gastro-esophageal reflux disease without esophagitis: Secondary | ICD-10-CM | POA: Diagnosis not present

## 2018-01-02 DIAGNOSIS — I10 Essential (primary) hypertension: Secondary | ICD-10-CM | POA: Diagnosis not present

## 2018-01-02 DIAGNOSIS — Z1389 Encounter for screening for other disorder: Secondary | ICD-10-CM | POA: Diagnosis not present

## 2018-01-31 DIAGNOSIS — F419 Anxiety disorder, unspecified: Secondary | ICD-10-CM | POA: Diagnosis not present

## 2018-01-31 DIAGNOSIS — G894 Chronic pain syndrome: Secondary | ICD-10-CM | POA: Diagnosis not present

## 2018-01-31 DIAGNOSIS — M542 Cervicalgia: Secondary | ICD-10-CM | POA: Diagnosis not present

## 2018-01-31 DIAGNOSIS — Z6833 Body mass index (BMI) 33.0-33.9, adult: Secondary | ICD-10-CM | POA: Diagnosis not present

## 2018-02-24 ENCOUNTER — Ambulatory Visit: Payer: PPO | Admitting: Gastroenterology

## 2018-03-01 DIAGNOSIS — Z1389 Encounter for screening for other disorder: Secondary | ICD-10-CM | POA: Diagnosis not present

## 2018-03-01 DIAGNOSIS — Z0001 Encounter for general adult medical examination with abnormal findings: Secondary | ICD-10-CM | POA: Diagnosis not present

## 2018-03-01 DIAGNOSIS — G2581 Restless legs syndrome: Secondary | ICD-10-CM | POA: Diagnosis not present

## 2018-03-01 DIAGNOSIS — Z681 Body mass index (BMI) 19 or less, adult: Secondary | ICD-10-CM | POA: Diagnosis not present

## 2018-03-30 DIAGNOSIS — R61 Generalized hyperhidrosis: Secondary | ICD-10-CM | POA: Diagnosis not present

## 2018-03-30 DIAGNOSIS — Z6831 Body mass index (BMI) 31.0-31.9, adult: Secondary | ICD-10-CM | POA: Diagnosis not present

## 2018-03-30 DIAGNOSIS — R202 Paresthesia of skin: Secondary | ICD-10-CM | POA: Diagnosis not present

## 2018-03-30 DIAGNOSIS — G894 Chronic pain syndrome: Secondary | ICD-10-CM | POA: Diagnosis not present

## 2018-03-30 DIAGNOSIS — I10 Essential (primary) hypertension: Secondary | ICD-10-CM | POA: Diagnosis not present

## 2018-04-05 ENCOUNTER — Other Ambulatory Visit: Payer: Self-pay | Admitting: Internal Medicine

## 2018-04-05 DIAGNOSIS — G459 Transient cerebral ischemic attack, unspecified: Secondary | ICD-10-CM

## 2018-04-05 IMAGING — MR MR HEAD W/O CM
13 series · 41 of 48 positions shown · non-contrast
Comparison: Cervical spine radiographs 07/26/2013.

CLINICAL DATA: 59-year-old female with left side weakness and
difficulty walking. Memory complaints, morning headaches. Depression
for 2 years. Initial encounter.

EXAM:
MRI HEAD WITHOUT CONTRAST
TECHNIQUE: Multiplanar, multiecho pulse sequences of the brain and surrounding
structures were obtained without intravenous contrast.

[Series 2: T1 · sagittal · 5.0mm · 0.39mm/px · 1 of 21 slices shown (1 of 2)]
[im 1/21]
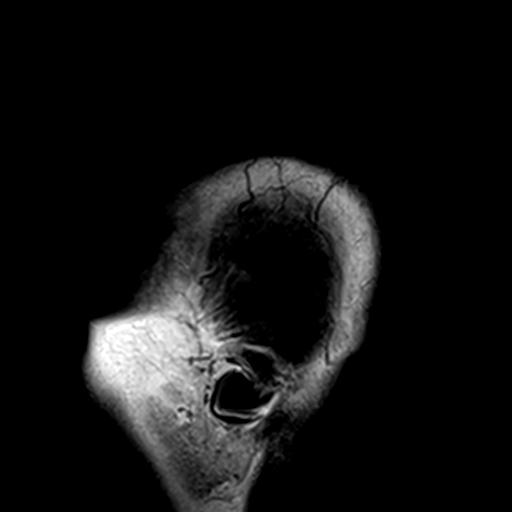

[Series 5: T2 · axial · 5.0mm · 0.45mm/px · 1 of 23 slices shown (1 of 2)]
[im 1/23]
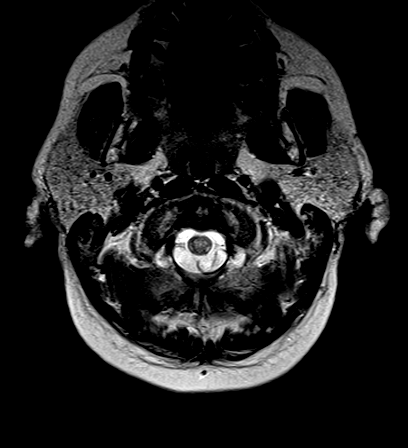

[Series 6: FLAIR · axial · 5.0mm · 0.32mm/px · 1 of 23 slices shown]
[im 1/23]
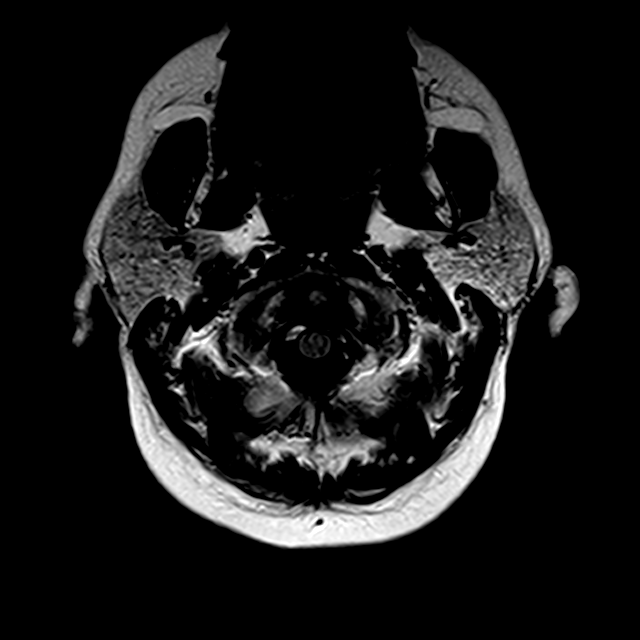

[Series 7: T1 · axial · 2.0mm · 0.40mm/px · z∈[-83,+67]mm · 4 of 76 slices shown (2 of 2)]
[im 1/76]
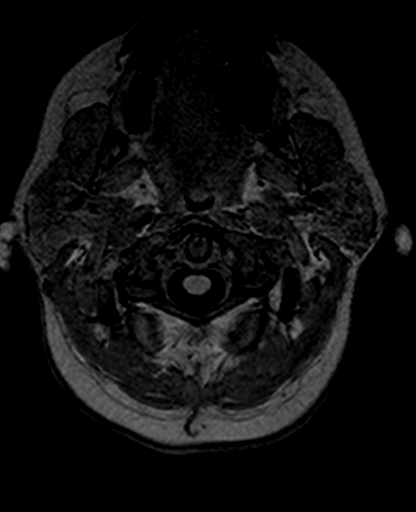
[im 26/76]
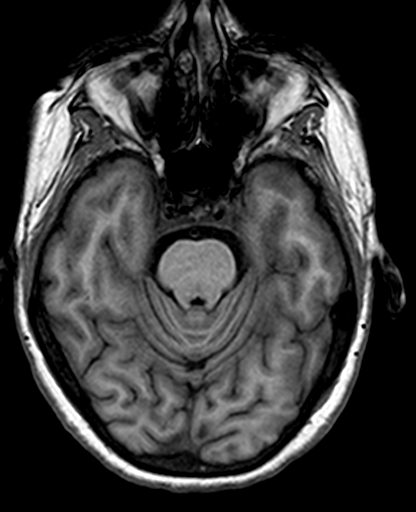
[im 51/76]
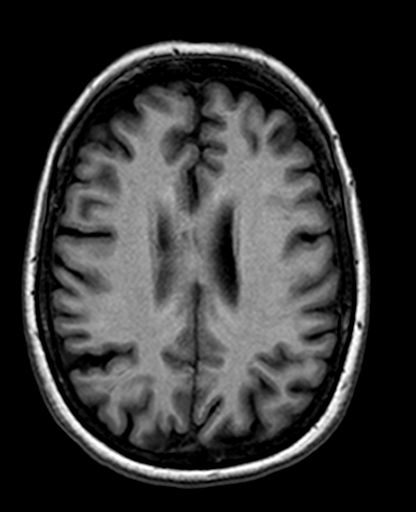
[im 76/76]
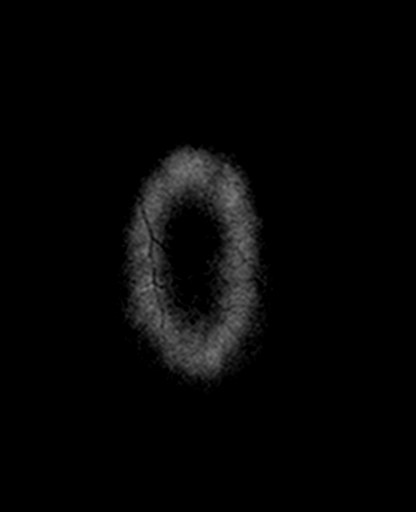

[Series 8: trauma axial · axial · 5.0mm · 0.39mm/px · z∈[-77,+66]mm · 2 of 23 slices shown]
[im 1/23]
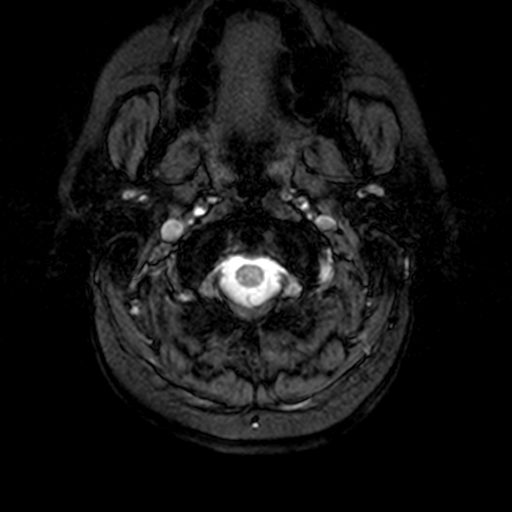
[im 23/23]
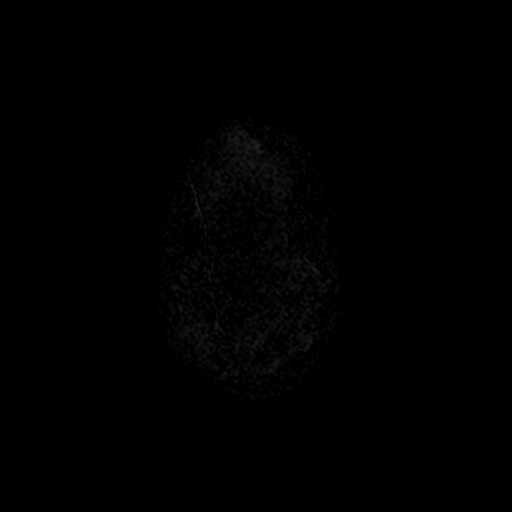

[Series 9: T2 · coronal · 5.0mm · 0.42mm/px · 2 of 24 slices shown (2 of 2)]
[im 1/24]
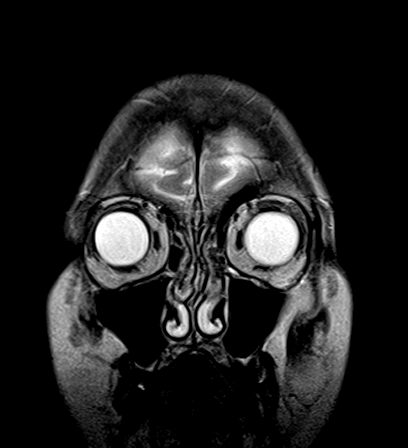
[im 24/24]
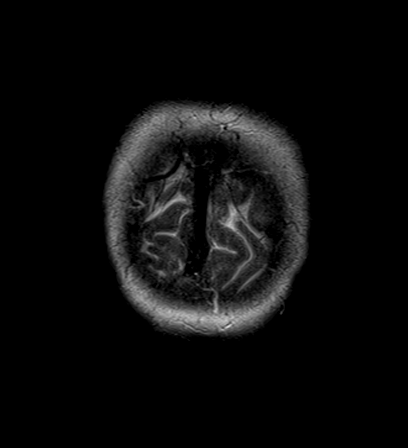

[Series 10: ms volume · sagittal · 0.9mm · 0.77mm/px · 8 of 176 slices shown]
[im 1/176]
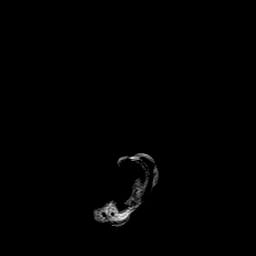
[im 32/176]
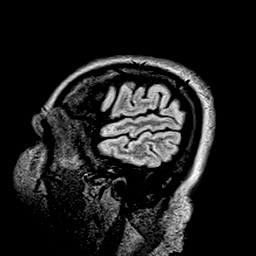
[im 48/176]
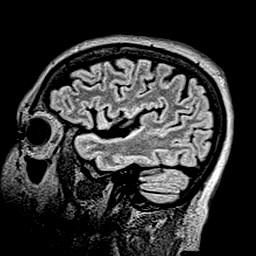
[im 80/176]
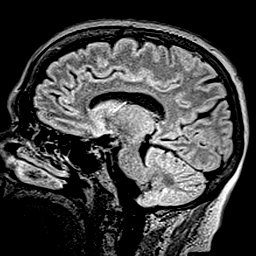
[im 96/176]
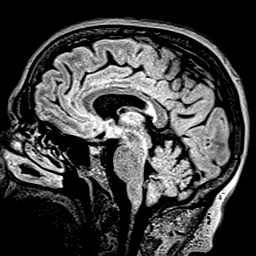
[im 128/176]
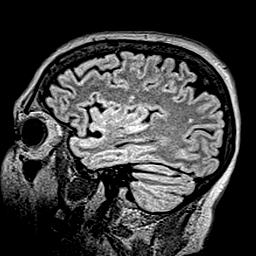
[im 144/176]
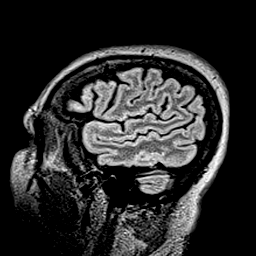
[im 176/176]
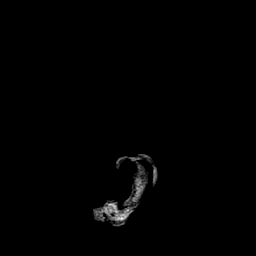

[Series 100: <mpr thick range> · axial · 3.0mm · 0.75mm/px · z∈[-74,+66]mm · 3 of 47 slices shown]
[im 1/47]
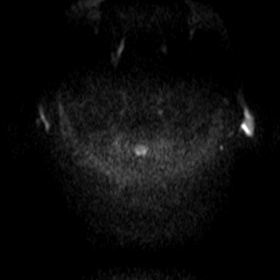
[im 24/47]
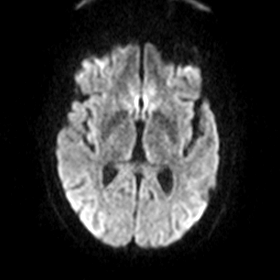
[im 47/47]
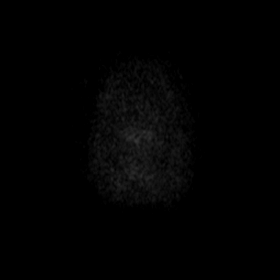

[Series 101: <mpr thick range(1)> · coronal · 3.0mm · 0.66mm/px · 4 of 57 slices shown]
[im 1/57]
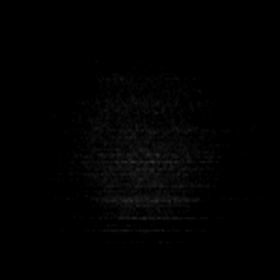
[im 19/57]
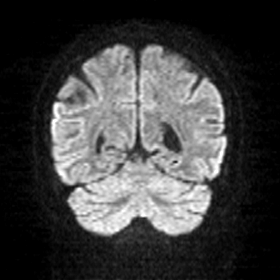
[im 38/57]
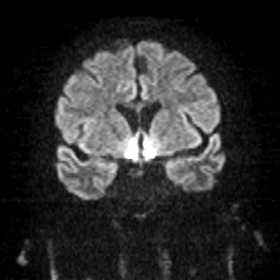
[im 57/57]
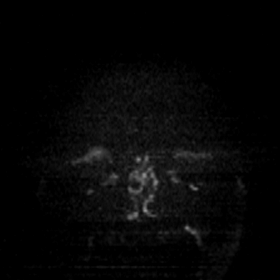

[Series 102: <mpr thick range(2)> · axial · 3.0mm · 0.75mm/px · z∈[-74,+66]mm · 3 of 46 slices shown]
[im 1/46]
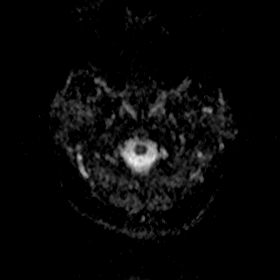
[im 23/46]
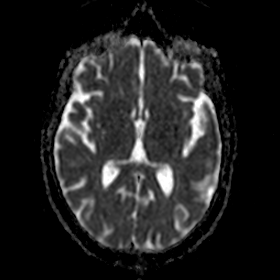
[im 46/46]
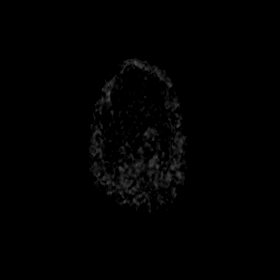

[Series 103: <mpr thick range(3)> · coronal · 3.0mm · 0.66mm/px · 4 of 56 slices shown]
[im 1/56]
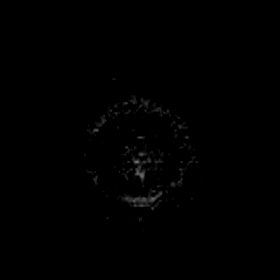
[im 19/56]
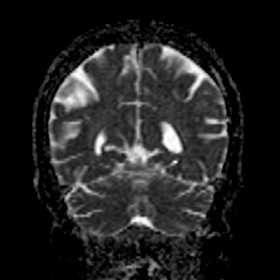
[im 37/56]
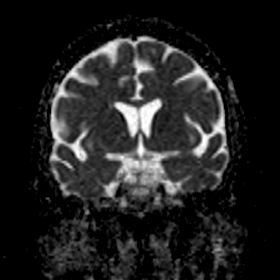
[im 56/56]
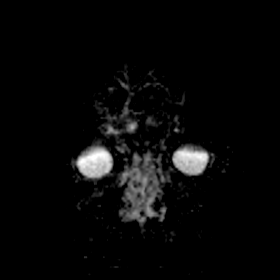

[Series 104: <mpr thick range(4)> · axial · 2.0mm · 0.73mm/px · z∈[-91,+50]mm · 5 of 72 slices shown]
[im 1/72]
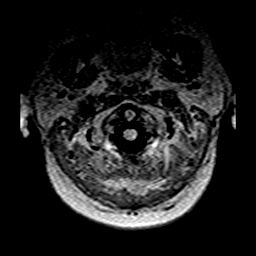
[im 18/72]
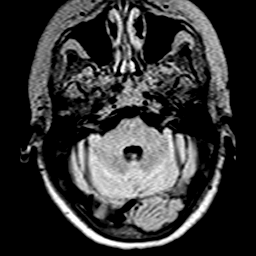
[im 36/72]
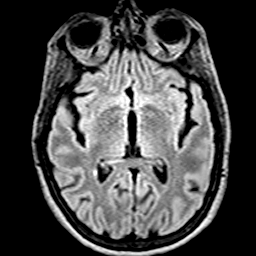
[im 54/72]
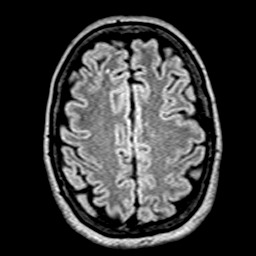
[im 72/72]
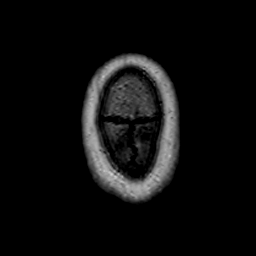

[Series 105: <mpr thick range(5)> · coronal · 2.0mm · 0.77mm/px · 3 of 87 slices shown]
[im 1/87]
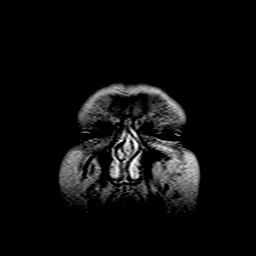
[im 18/87]
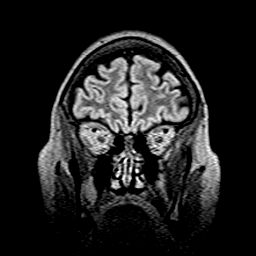
[im 35/87]
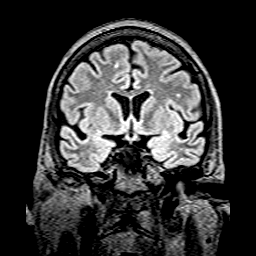

[41 of 48 positions shown; findings below may reference images not displayed]

FINDINGS: Cerebral volume is within normal limits for age. No restricted
diffusion to suggest acute infarction. No midline shift, mass
effect, evidence of mass lesion, ventriculomegaly, extra-axial
collection or acute intracranial hemorrhage. Cervicomedullary
junction within normal limits. Negative visualized cervical spine.
Major intracranial vascular flow voids are preserved.

The pituitary is remarkable for a small 4-6 mm area of increased T2
and decreased T1 signal at the central aspect of the gland (series
2, image 11 and series 9, image 17). No suprasellar extension or
mass effect. The infundibulum appears normal. Normal noncontrast
appearance of the cavernous sinus.

Scattered small mostly subcortical bilateral cerebral white matter
foci of T2 and FLAIR hyperintensity in a nonspecific configuration.
The extent is mild for age. No cortical encephalomalacia or chronic
cerebral blood products deep gray matter nuclei, brainstem, and
cerebellum are normal.

Visible internal auditory structures appear normal. Mastoids and
paranasal sinuses are clear. Negative orbit and scalp soft tissues.
Normal bone marrow signal.
IMPRESSION: 1.  No acute intracranial abnormality.
2. Mild for age nonspecific cerebral white matter signal changes,
most commonly due to chronic small vessel disease. Acute
significance doubtful.
3. Sub cm central pituitary lesion most likely is a benign pituitary
cyst (such as pars intermedius cyst or Rathke cleft cyst). Clinical
significance doubtful in the absence of pituitary hormone
abnormality.

## 2018-04-12 ENCOUNTER — Other Ambulatory Visit: Payer: Self-pay

## 2018-04-12 ENCOUNTER — Ambulatory Visit (HOSPITAL_COMMUNITY)
Admission: RE | Admit: 2018-04-12 | Discharge: 2018-04-12 | Disposition: A | Discharge status: Home / Self Care | Payer: PPO | Source: Ambulatory Visit | Attending: Internal Medicine | Admitting: Internal Medicine

## 2018-04-12 DIAGNOSIS — G459 Transient cerebral ischemic attack, unspecified: Secondary | ICD-10-CM | POA: Diagnosis not present

## 2018-04-12 DIAGNOSIS — R531 Weakness: Secondary | ICD-10-CM | POA: Diagnosis not present

## 2018-04-27 DIAGNOSIS — M5030 Other cervical disc degeneration, unspecified cervical region: Secondary | ICD-10-CM | POA: Diagnosis not present

## 2018-04-27 DIAGNOSIS — G64 Other disorders of peripheral nervous system: Secondary | ICD-10-CM | POA: Diagnosis not present

## 2018-04-27 DIAGNOSIS — G894 Chronic pain syndrome: Secondary | ICD-10-CM | POA: Diagnosis not present

## 2018-05-18 ENCOUNTER — Other Ambulatory Visit: Payer: Self-pay | Admitting: Gastroenterology

## 2018-05-25 DIAGNOSIS — G4700 Insomnia, unspecified: Secondary | ICD-10-CM | POA: Diagnosis not present

## 2018-05-25 DIAGNOSIS — I10 Essential (primary) hypertension: Secondary | ICD-10-CM | POA: Diagnosis not present

## 2018-05-25 DIAGNOSIS — K743 Primary biliary cirrhosis: Secondary | ICD-10-CM | POA: Diagnosis not present

## 2018-05-25 DIAGNOSIS — G894 Chronic pain syndrome: Secondary | ICD-10-CM | POA: Diagnosis not present

## 2018-05-25 DIAGNOSIS — E669 Obesity, unspecified: Secondary | ICD-10-CM | POA: Diagnosis not present

## 2018-05-25 DIAGNOSIS — F419 Anxiety disorder, unspecified: Secondary | ICD-10-CM | POA: Diagnosis not present

## 2018-05-25 DIAGNOSIS — Z6831 Body mass index (BMI) 31.0-31.9, adult: Secondary | ICD-10-CM | POA: Diagnosis not present

## 2018-06-14 ENCOUNTER — Ambulatory Visit: Payer: Self-pay | Admitting: Rheumatology

## 2018-06-22 DIAGNOSIS — Z1389 Encounter for screening for other disorder: Secondary | ICD-10-CM | POA: Diagnosis not present

## 2018-06-22 DIAGNOSIS — I10 Essential (primary) hypertension: Secondary | ICD-10-CM | POA: Diagnosis not present

## 2018-06-22 DIAGNOSIS — R7309 Other abnormal glucose: Secondary | ICD-10-CM | POA: Diagnosis not present

## 2018-06-22 DIAGNOSIS — Z6832 Body mass index (BMI) 32.0-32.9, adult: Secondary | ICD-10-CM | POA: Diagnosis not present

## 2018-06-22 DIAGNOSIS — Z79899 Other long term (current) drug therapy: Secondary | ICD-10-CM | POA: Diagnosis not present

## 2018-06-22 DIAGNOSIS — G894 Chronic pain syndrome: Secondary | ICD-10-CM | POA: Diagnosis not present

## 2018-06-22 DIAGNOSIS — E748 Other specified disorders of carbohydrate metabolism: Secondary | ICD-10-CM | POA: Diagnosis not present

## 2018-06-22 DIAGNOSIS — E7849 Other hyperlipidemia: Secondary | ICD-10-CM | POA: Diagnosis not present

## 2018-06-22 DIAGNOSIS — K219 Gastro-esophageal reflux disease without esophagitis: Secondary | ICD-10-CM | POA: Diagnosis not present

## 2018-06-22 DIAGNOSIS — G4700 Insomnia, unspecified: Secondary | ICD-10-CM | POA: Diagnosis not present

## 2018-06-22 DIAGNOSIS — K743 Primary biliary cirrhosis: Secondary | ICD-10-CM | POA: Diagnosis not present

## 2018-06-22 DIAGNOSIS — F419 Anxiety disorder, unspecified: Secondary | ICD-10-CM | POA: Diagnosis not present

## 2018-07-04 DIAGNOSIS — Z85820 Personal history of malignant melanoma of skin: Secondary | ICD-10-CM | POA: Diagnosis not present

## 2018-07-04 DIAGNOSIS — D225 Melanocytic nevi of trunk: Secondary | ICD-10-CM | POA: Diagnosis not present

## 2018-07-04 DIAGNOSIS — Z1283 Encounter for screening for malignant neoplasm of skin: Secondary | ICD-10-CM | POA: Diagnosis not present

## 2018-07-04 DIAGNOSIS — Z08 Encounter for follow-up examination after completed treatment for malignant neoplasm: Secondary | ICD-10-CM | POA: Diagnosis not present

## 2018-07-20 DIAGNOSIS — G5602 Carpal tunnel syndrome, left upper limb: Secondary | ICD-10-CM | POA: Diagnosis not present

## 2018-07-20 DIAGNOSIS — G64 Other disorders of peripheral nervous system: Secondary | ICD-10-CM | POA: Diagnosis not present

## 2018-07-20 DIAGNOSIS — Z6832 Body mass index (BMI) 32.0-32.9, adult: Secondary | ICD-10-CM | POA: Diagnosis not present

## 2018-07-20 DIAGNOSIS — G894 Chronic pain syndrome: Secondary | ICD-10-CM | POA: Diagnosis not present

## 2018-08-17 DIAGNOSIS — E6609 Other obesity due to excess calories: Secondary | ICD-10-CM | POA: Diagnosis not present

## 2018-08-17 DIAGNOSIS — Z6831 Body mass index (BMI) 31.0-31.9, adult: Secondary | ICD-10-CM | POA: Diagnosis not present

## 2018-08-17 DIAGNOSIS — G894 Chronic pain syndrome: Secondary | ICD-10-CM | POA: Diagnosis not present

## 2018-08-17 DIAGNOSIS — F329 Major depressive disorder, single episode, unspecified: Secondary | ICD-10-CM | POA: Diagnosis not present

## 2018-08-17 DIAGNOSIS — F419 Anxiety disorder, unspecified: Secondary | ICD-10-CM | POA: Diagnosis not present

## 2018-09-06 DIAGNOSIS — D225 Melanocytic nevi of trunk: Secondary | ICD-10-CM | POA: Diagnosis not present

## 2018-09-06 DIAGNOSIS — L905 Scar conditions and fibrosis of skin: Secondary | ICD-10-CM | POA: Diagnosis not present

## 2018-09-11 DIAGNOSIS — H40002 Preglaucoma, unspecified, left eye: Secondary | ICD-10-CM | POA: Diagnosis not present

## 2018-09-11 DIAGNOSIS — H40013 Open angle with borderline findings, low risk, bilateral: Secondary | ICD-10-CM | POA: Diagnosis not present

## 2018-09-11 DIAGNOSIS — H2513 Age-related nuclear cataract, bilateral: Secondary | ICD-10-CM | POA: Diagnosis not present

## 2018-09-11 DIAGNOSIS — H40001 Preglaucoma, unspecified, right eye: Secondary | ICD-10-CM | POA: Diagnosis not present

## 2018-09-11 DIAGNOSIS — H1859 Other hereditary corneal dystrophies: Secondary | ICD-10-CM | POA: Diagnosis not present

## 2018-09-15 DIAGNOSIS — Z6831 Body mass index (BMI) 31.0-31.9, adult: Secondary | ICD-10-CM | POA: Diagnosis not present

## 2018-09-15 DIAGNOSIS — G43909 Migraine, unspecified, not intractable, without status migrainosus: Secondary | ICD-10-CM | POA: Diagnosis not present

## 2018-09-15 DIAGNOSIS — F419 Anxiety disorder, unspecified: Secondary | ICD-10-CM | POA: Diagnosis not present

## 2018-09-15 DIAGNOSIS — G894 Chronic pain syndrome: Secondary | ICD-10-CM | POA: Diagnosis not present

## 2018-09-15 DIAGNOSIS — R51 Headache: Secondary | ICD-10-CM | POA: Diagnosis not present

## 2018-09-26 ENCOUNTER — Ambulatory Visit: Payer: PPO | Admitting: Urology

## 2018-09-27 DIAGNOSIS — R51 Headache: Secondary | ICD-10-CM | POA: Diagnosis not present

## 2018-10-10 ENCOUNTER — Encounter: Payer: Self-pay | Admitting: Gastroenterology

## 2018-10-10 ENCOUNTER — Telehealth: Payer: Self-pay | Admitting: Internal Medicine

## 2018-10-10 ENCOUNTER — Other Ambulatory Visit: Payer: Self-pay

## 2018-10-10 ENCOUNTER — Ambulatory Visit (INDEPENDENT_AMBULATORY_CARE_PROVIDER_SITE_OTHER): Payer: PPO | Admitting: Gastroenterology

## 2018-10-10 VITALS — BP 128/85 | HR 100 | Temp 96.9°F | Ht 66.0 in | Wt 172.6 lb

## 2018-10-10 DIAGNOSIS — R197 Diarrhea, unspecified: Secondary | ICD-10-CM | POA: Diagnosis not present

## 2018-10-10 DIAGNOSIS — K743 Primary biliary cirrhosis: Secondary | ICD-10-CM | POA: Diagnosis not present

## 2018-10-10 MED ORDER — URSODIOL 500 MG PO TABS: 500.0000 mg | ORAL_TABLET | Freq: Two times a day (BID) | ORAL | 5 refills | Status: DC

## 2018-10-10 MED ORDER — RIFAXIMIN 550 MG PO TABS: 550.0000 mg | ORAL_TABLET | Freq: Three times a day (TID) | ORAL | 0 refills | Status: AC

## 2018-10-10 NOTE — Progress Notes (Signed)
Referring Provider: Redmond School, MD Primary Care Physician:  Redmond School, MD  Primary GI: Dr. Gala Romney   Chief Complaint  Patient presents with  . Abdominal Pain    occ; doesn't occur if has diarrhea  . Diarrhea    HPI:   Laura [name] is a 63 y.o. female presenting today with a history of chronic diarrhea, previously evaluated extensively and worked up for microscopic colitis and celiac disease. Ileocolonoscopy in July 2017 negative for microscopiccolitis. Felt to have IBS-D. Hep B core antibody positive in past but felt to be a false positive. Hep C antibody negative.   Elevated alk phos: AMA markedly positive at 151. Felt to likely have [place]. [place] on file from Dec 2018 with fatty liver or hepatocellular disease. Started on urso and arranged elastography: this was minimal fibrosis with F0/F1. Recommended 1000 mg calcium and 1000 iu Vit D daily. Needs DEXA. Holding on liver biopsy unless worsening of LFTs +/- abnormal transaminases. April with improved alk Phos at 141. Mos recent Aug 2019 completely normal alk phos.   Unable to do DEXA scan right now due to finances and other health issues. Diarrhea seems to be letting up some. Will have days without a BM. If doesn't have a BM, will have a belly ache. Stools range from as few as 3 to numerous a day. Trying to lose weight. Borderline diabetic.   Takes Bentyl if going more than 6 times per day, which will help slow her down. Urso BID. Cholecystectomy 1995. Can't pinpoint which foods worsen her symptoms as it is different day to day. Takes Vit D in the winter. Quit smoking 3-4 years ago. Greasy stools.      Past Medical History:  Diagnosis Date  . Acromioclavicular joint arthritis    left shoulder  . Anxiety   . Cancer ([place])    skin (facial)   . GERD (gastroesophageal reflux disease)   . H/O hiatal hernia   . Hepatitis B antibody positive    letter from [facility], Hep B core total ab positive  . History of melanoma excision     01-05-2014  . Hypertension   . IBS (irritable bowel syndrome)   . Nocturnal hypoxia   . OA (osteoarthritis) of knee    RIGHT  . RLS (restless legs syndrome)   . Rotator cuff impingement syndrome of left shoulder   . Shortness of breath dyspnea   . Unspecified hereditary and idiopathic peripheral neuropathy     Past Surgical History:  Procedure Laterality Date  . ABDOMINAL HYSTERECTOMY    . ANTERIOR CERVICAL DECOMP/DISCECTOMY FUSION  2000   C4 -- C5  . BIOPSY  08/11/2015   Procedure: BIOPSY;  Surgeon: Daneil Dolin, MD;  Location: [facility];  Service: Endoscopy;;  gastric polyp;  . cardiac cath     unremarkable  . CARPAL TUNNEL RELEASE Right 1995  . CHOLECYSTECTOMY  1996  . COLONOSCOPY  07/2007   Dr. Gala Romney: Terminal ileum normal. Status post segmental colonic mucosal biopsy.  . COLONOSCOPY WITH PROPOFOL N/A 08/11/2015   normal colonoscopy with benign colonic mucosa  . ESOPHAGOGASTRODUODENOSCOPY  07/2007   Dr. Gala Romney: Small hiatal hernia, duodenal biopsy unremarkable. Fundic gland gastric polyps.  . ESOPHAGOGASTRODUODENOSCOPY (EGD) WITH PROPOFOL N/A 08/11/2015   LA Grade A esophagitis, small hiatal hernia, few gastric polyps, normal duodenum (benign fundic gland polyp)  . KNEE ARTHROSCOPY WITH MEDIAL MENISECTOMY Right 05/25/2013   Procedure: RIGHT KNEE ARTHROSCOPY WITH PARTIAL LATERAL and MEDIAL MENISECTOMY AND  CHONDROPLASTY;  Surgeon: Sydnee Cabal, MD;  Location: [facility];  Service: Orthopedics;  Laterality: Right;  . LUMBAR DISC SURGERY  08-22-2003   LEFT  L5  --  S1  . MOHS SURGERY  01-05-2014   LEFT SIDE OF FACE  . SHOULDER ARTHROSCOPY WITH SUBACROMIAL DECOMPRESSION Left 01/29/2014   Procedure: LEFT SHOULDER ARTHROSCOPY WITH SUBACROMIAL DECOMPRESSION/DISTAL CLAVICLE RESECTION AND DEBRIDEMENT;  Surgeon: Sydnee Cabal, MD;  Location[facility];  Service: Orthopedics;  Laterality: Left;  . TOTAL ABDOMINAL HYSTERECTOMY W/ BILATERAL  SALPINGOOPHORECTOMY  1999    Current Outpatient Medications  Medication Sig Dispense Refill  . ALPRAZolam (XANAX) 0.5 MG tablet Take 0.5 mg by mouth as needed.    . hydrochlorothiazide (MICROZIDE) 12.5 MG capsule Take 12.5 mg by mouth every morning.     Marland Kitchen losartan (COZAAR) 100 MG tablet Take 100 mg by mouth every morning.     . Omega-3 Fatty Acids (FISH OIL PO) Take by mouth.    . Oxycodone HCl 10 MG TABS Take 10 mg by mouth every 4 (four) hours as needed.  0  . RABEprazole (ACIPHEX) 20 MG tablet TAKE 1 TABLET BY MOUTH TWICE DAILY 60 tablet 5  . sucralfate (CARAFATE) 1 g tablet Take 1 g by mouth as needed.     . ursodiol (ACTIGALL) 500 MG tablet Take 1 tablet (500 mg total) by mouth 2 (two) times daily. 60 tablet 5  . zolpidem (AMBIEN) 10 MG tablet Take 1 tablet by mouth as needed.    . rifaximin (XIFAXAN) 550 MG TABS tablet Take 1 tablet (550 mg total) by mouth 3 (three) times daily for 14 days. 42 tablet 0   No current facility-administered medications for this visit.     Allergies as of 10/10/2018 - Review Complete 10/10/2018  Allergen Reaction Noted  . Aspirin Other (See Comments) 06/16/2015  . Celebrex [celecoxib] Hives 07/27/2012    Family History  Problem Relation Age of Onset  . Lung cancer Mother   . HIV/AIDS Father   . Crohn's disease Cousin   . Breast cancer Maternal Aunt   . Sjogren's syndrome Sister   . Diabetes Brother   . Heart attack Daughter   . Drug abuse Son   . ADD / ADHD Grandchild   . Colon cancer Neg Hx     Social History   Socioeconomic History  . Marital status: Married    Spouse name: [name]  . Number of children: 3  . Years of education: GED  . Highest education level: Not on file  Occupational History    Comment: not employed  Social Needs  . Financial resource strain: Not on file  . Food insecurity    Worry: Not on file    Inability: Not on file  . Transportation needs    Medical: Not on file    Non-medical: Not on file  Tobacco  Use  . Smoking status: Former Smoker    Packs/day: 1.00    Years: 20.00    Pack years: 20.00    Types: Cigarettes    Quit date: 01/26/2015    Years since quitting: 3.7  . Smokeless tobacco: Never Used  Substance and Sexual Activity  . Alcohol use: No    Alcohol/week: 0.0 standard drinks    Frequency: Never  . Drug use: No  . Sexual activity: Not on file  Lifestyle  . Physical activity    Days per week: Not on file    Minutes per session: Not  on file  . Stress: Not on file  Relationships  . Social Herbalist on phone: Not on file    Gets together: Not on file    Attends religious service: Not on file    Active member of club or organization: Not on file    Attends meetings of clubs or organizations: Not on file    Relationship status: Not on file  Other Topics Concern  . Not on file  Social History Narrative   Patient lives at home with her husband [name]).    Patient is not working as time but she trying for disability.   Education 10th grade   Caffeine daily coffee and mountain dew . One can daily.   .       Review of Systems: Gen: Denies fever, chills, anorexia. Denies fatigue, weakness, weight loss.  CV: Denies chest pain, palpitations, syncope, peripheral edema, and claudication. Resp: Denies dyspnea at rest, cough, wheezing, coughing up blood, and pleurisy. GI: see HPI Derm: Denies rash, itching, dry skin Psych: Denies depression, anxiety, memory loss, confusion. No homicidal or suicidal ideation.  Heme: Denies bruising, bleeding, and enlarged lymph nodes.  Physical Exam: BP 128/85   Pulse 100   Temp (!) 96.9 F (36.1 C) (Temporal)   Ht '5\' 6"'  (1.676 m)   Wt 172 lb 9.6 oz (78.3 kg)   BMI 27.86 kg/m  General:   Alert and oriented. No distress noted. Pleasant and cooperative.  Head:  Normocephalic and atraumatic. Eyes:  Conjuctiva clear without scleral icterus. Abdomen:  +BS, soft, non-tender and non-distended. No rebound or guarding. No HSM  or masses noted. Msk:  Symmetrical without gross deformities. Normal posture. Extremities:  Without edema. Neurologic:  Alert and  oriented x4 Psych:  Alert and cooperative. Normal mood and affect.  Lab Results  Component Value Date   ALT 24 09/30/2017   AST 18 09/30/2017   ALKPHOS 117 09/30/2017   BILITOT 0.4 09/30/2017

## 2018-10-10 NOTE — Patient Instructions (Addendum)
I'm requesting blood work from Dr. Gerarda Fraction.   Continue to take Leonides Cave twice a day.  I have sent in Xifaxan to take three times a day for 14 days. We will see if this is covered/affordable through insurance. It may need a prior authorization, which we can work on.  If we aren't able to get Xifaxan OR it does not improve diarrhea, I will provide you samples of pancreas enzymes.   Let's try avoiding gluten for the next few weeks and see how you do. I have attached a handout and provided an extra one here.   I would like to see you back in 6 months!  I enjoyed seeing you again today! As you know, I value our relationship and want to provide genuine, compassionate, and quality care. I welcome your feedback. If you receive a survey regarding your visit,  I greatly appreciate you taking time to fill this out. See you next time!  Annitta Needs, PhD, ANP-BC [facility]    Gluten-Free Diet for Celiac Disease, Adult  The gluten-free diet includes all foods that do not contain gluten. Gluten is a protein that is found in wheat, rye, barley, and some other grains. Following the gluten-free diet is the only treatment for people with celiac disease. It helps to prevent damage to the intestines and improves or eliminates the symptoms of celiac disease. Following the gluten-free diet requires some planning. It can be challenging at first, but it gets easier with time and practice. There are more gluten-free options available today than ever before. If you need help finding gluten-free foods or if you have questions, talk with your diet and nutrition specialist (registered dietitian) or your health care provider. What do I need to know about a gluten-free diet?  All fruits, vegetables, and meats are safe to eat and do not contain gluten.  When grocery shopping, start by shopping in the produce, meat, and dairy sections. These sections are more likely to contain gluten-free foods. Then move to the  aisles that contain packaged foods if you need to.  Read all food labels. Gluten is often added to foods. Always check the ingredient list and look for warnings, such as "may contain gluten."  Talk with your dietitian or health care provider before taking a gluten-free multivitamin or mineral supplement.  Be aware of gluten-free foods having contact with foods that contain gluten (cross-contamination). This can happen at home and with any processed foods. ? Talk with your health care provider or dietitian about how to reduce the risk of cross-contamination in your home. ? If you have questions about how a food is processed, ask the manufacturer. What key words help to identify gluten? Foods that list any of these key words on the label usually contain gluten:  Wheat, flour, enriched flour, bromated flour, white flour, durum flour, graham flour, phosphated flour, self-rising flour, semolina, farina, barley (malt), rye, and oats.  Starch, dextrin, modified food starch, or cereal.  Thickening, fillers, or emulsifiers.  Malt flavoring, malt extract, or malt syrup.  Hydrolyzed vegetable protein. In the [place] packaged foods that are gluten-free are required to be labeled "GF." These foods should be easy to identify and are safe to eat. In the [place] food companies are also required to list common food allergens, including wheat, on their labels. Recommended foods Grains  Amaranth, bean flours, 100% buckwheat flour, corn, millet, nut flours or nut meals, GF oats, quinoa, rice, sorghum, teff, rice wafers, pure cornmeal tortillas, popcorn, and hot  cereals made from cornmeal. Hominy, rice, wild rice. Some Asian rice noodles or bean noodles. Arrowroot starch, corn bran, corn flour, corn germ, cornmeal, corn starch, potato flour, potato starch flour, and rice bran. Plain, brown, and sweet rice flours. Rice polish, soy flour, and tapioca starch. Vegetables  All plain fresh, frozen, and canned  vegetables. Fruits  All plain fresh, frozen, canned, and dried fruits, and 100% fruit juices. Meats and other protein foods  All fresh beef, pork, poultry, fish, seafood, and eggs. Fish canned in water, oil, brine, or vegetable broth. Plain nuts and seeds, peanut butter. Some lunch meat and some frankfurters. Dried beans, dried peas, and lentils. Dairy  Fresh plain, dry, evaporated, or condensed milk. Cream, butter, sour cream, whipping cream, and most yogurts. Unprocessed cheese, most processed cheeses, some cottage cheese, some cream cheeses. Beverages  Coffee, tea, most herbal teas. Carbonated beverages and some root beers. Wine, sake, and distilled spirits, such as gin, vodka, and whiskey. Most hard ciders. Fats and oils  Butter, margarine, vegetable oil, hydrogenated butter, olive oil, shortening, lard, cream, and some mayonnaise. Some commercial salad dressings. Olives. Sweets and desserts  Sugar, honey, some syrups, molasses, jelly, and jam. Plain hard candy, marshmallows, and gumdrops. Pure cocoa powder. Plain chocolate. Custard and some pudding mixes. Gelatin desserts, sorbets, frozen ice pops, and sherbet. Cake, cookies, and other desserts prepared with allowed flours. Some commercial ice creams. Cornstarch, tapioca, and rice puddings. Seasoning and other foods  Some canned or frozen soups. Monosodium glutamate (MSG). Cider, rice, and wine vinegar. Baking soda and baking powder. Cream of tartar. Baking and nutritional yeast. Certain soy sauces made without wheat (ask your dietitian about specific brands that are allowed). Nuts, coconut, and chocolate. Salt, pepper, herbs, spices, flavoring extracts, imitation or artificial flavorings, natural flavorings, and food colorings. Some medicines and supplements. Some lip glosses and other cosmetics. Rice syrups. The items listed may not be a complete list. Talk with your dietitian about what dietary choices are best for you. Foods to  avoid Grains  Barley, bran, bulgur, couscous, cracked wheat, [place], farro, graham, malt, matzo, semolina, wheat germ, and all wheat and rye cereals including spelt and kamut. Cereals containing malt as a flavoring, such as rice cereal. Noodles, spaghetti, macaroni, most packaged rice mixes, and all mixes containing wheat, rye, barley, or triticale. Vegetables  Most creamed vegetables and most vegetables canned in sauces. Some commercially prepared vegetables and salads. Fruits  Thickened or prepared fruits and some pie fillings. Some fruit snacks and fruit roll-ups. Meats and other protein foods  Any meat or meat alternative containing wheat, rye, barley, or gluten stabilizers. These are often marinated or packaged meats and lunch meats. Bread-containing products, such as Swiss steak, croquettes, meatballs, and meatloaf. Most tuna canned in vegetable broth and [place] with hydrolyzed vegetable protein (HVP) injected as part of the basting. [place]. Imitation fish. Eggs in sauces made from ingredients to avoid. Dairy  Commercial chocolate milk drinks and malted milk. Some non-dairy creamers. Any cheese product containing ingredients to avoid. Beverages  Certain cereal beverages. Beer, ale, malted milk, and some root beers. Some hard ciders. Some instant flavored coffees. Some herbal teas made with barley or with barley malt added. Fats and oils  Some commercial salad dressings. Sour cream containing modified food starch. Sweets and desserts  Some toffees. Chocolate-coated nuts (may be rolled in wheat flour) and some commercial candies and candy bars. Most cakes, cookies, donuts, pastries, and other baked goods. Some commercial ice cream. Ice cream  cones. Commercially prepared mixes for cakes, cookies, and other desserts. Bread pudding and other puddings thickened with flour. Products containing brown rice syrup made with barley malt enzyme. Desserts and sweets made with malt  flavoring. Seasoning and other foods  Some curry powders, some dry seasoning mixes, some gravy extracts, some meat sauces, some ketchups, some prepared mustards, and horseradish. Certain soy sauces. Malt vinegar. Bouillon and bouillon cubes that contain HVP. Some chip dips, and some chewing gum. Yeast extract. Brewer's yeast. Caramel color. Some medicines and supplements. Some lip glosses and other cosmetics. The items listed may not be a complete list. Talk with your dietitian about what dietary choices are best for you. Summary  Gluten is a protein that is found in wheat, rye, barley, and some other grains. The gluten-free diet includes all foods that do not contain gluten.  If you need help finding gluten-free foods or if you have questions, talk with your diet and nutrition specialist (registered dietitian) or your health care provider.  Read all food labels. Gluten is often added to foods. Always check the ingredient list and look for warnings, such as "may contain gluten." This information is not intended to replace advice given to you by your health care provider. Make sure you discuss any questions you have with your health care provider. Document Released: 01/18/2005 Document Revised: 12/31/2016 Document Reviewed: 11/03/2015 Elsevier Patient Education  2020 [employer].

## 2018-10-10 NOTE — Telephone Encounter (Signed)
Pt said she saw AB today and wanted AB to know that the Xifaxan was $2000 and her part would be $577 for a 14 day supply. Please advise

## 2018-10-11 ENCOUNTER — Encounter: Payer: Self-pay | Admitting: Gastroenterology

## 2018-10-11 NOTE — Assessment & Plan Note (Signed)
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 In light of PBC, malabsorption could certainly be contributing. Bentyl with some improvement. She does note greasy stools. Diarrhea multifactorial in setting of IBS, PBC. Will attempt one course of Xifaxan, if insurance is able to cover this. If not, could trial Imodium prn or even pancreatic enzymes with meals. Discussed also possibility of Colestid with patient. Will start first with attempt at obtaining Xifaxan, trial gluten-free. Further recommendations to follow.

## 2018-10-11 NOTE — Assessment & Plan Note (Addendum)
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 Fibrosis score F0/F1 on elastography. Last labs available in Aug 2019. Unable to complete DEXA scan due to finances. Discussed Vit D and Calcium supplementation. Will retrieve labs from PCP and update if needed. Return in 6 months.   Addendum: labs from May 2020. Hgb 15.4, Hct 43.7, Platelets, 342, BUN 16, Creatinine 0.86, Tbili, 0.5, Alk PHos 130, AST 19, ALT 23.

## 2018-10-17 DIAGNOSIS — Z6831 Body mass index (BMI) 31.0-31.9, adult: Secondary | ICD-10-CM | POA: Diagnosis not present

## 2018-10-17 DIAGNOSIS — G894 Chronic pain syndrome: Secondary | ICD-10-CM | POA: Diagnosis not present

## 2018-10-17 DIAGNOSIS — F419 Anxiety disorder, unspecified: Secondary | ICD-10-CM | POA: Diagnosis not present

## 2018-10-17 DIAGNOSIS — R0981 Nasal congestion: Secondary | ICD-10-CM | POA: Diagnosis not present

## 2018-10-24 MED ORDER — URSODIOL 500 MG PO TABS: 500.0000 mg | ORAL_TABLET | Freq: Two times a day (BID) | ORAL | 5 refills | Status: DC

## 2018-10-24 NOTE — Telephone Encounter (Signed)
Noted  

## 2018-10-24 NOTE — Telephone Encounter (Signed)
AB, I've tried to do a couple PA's and it's saying pt doesn't require a PA for the medication. Medication was very expensive for pt.  Pt spoke with her insurance company and they wont reduce the price of the medication for pt. Pt was previously taking Urso and doesn't have any refills. Please advise on what medication can take.

## 2018-10-24 NOTE — Addendum Note (Signed)
Addended by: Annitta Needs on: 10/24/2018 10:56 AM   Modules accepted: Orders

## 2018-10-24 NOTE — Telephone Encounter (Signed)
Spoke with pt. She was notified that her RX Leonides Cave was previously filled and a new refill was sent to her pharmacy today. Pt would like to take the Imodium 2 gm qam and prn  first and if that's not helping, she will pick samples up of Creon.

## 2018-10-24 NOTE — Telephone Encounter (Signed)
I refilled Urso earlier this month, but I've refilled again and sent to [facility]. She needs to remain on this BID. As Xifaxan not approved, please let her know we have the following options:  1. Can take Imodium 2 mg each morning and prn 2. Could trial Colestid 3. Pancreatic enzymes samples. We discussed pancreatic enzymes at last appointment. If she would like to try this, please provide Creon 36,000 units. Take 2 capsules with meals and 1 with snacks, then call with progress report.   What would she prefer?

## 2018-10-30 DIAGNOSIS — Z23 Encounter for immunization: Secondary | ICD-10-CM | POA: Diagnosis not present

## 2018-11-07 ENCOUNTER — Other Ambulatory Visit: Payer: Self-pay | Admitting: Nurse Practitioner

## 2018-11-16 DIAGNOSIS — G64 Other disorders of peripheral nervous system: Secondary | ICD-10-CM | POA: Diagnosis not present

## 2018-11-16 DIAGNOSIS — Z6831 Body mass index (BMI) 31.0-31.9, adult: Secondary | ICD-10-CM | POA: Diagnosis not present

## 2018-11-16 DIAGNOSIS — G894 Chronic pain syndrome: Secondary | ICD-10-CM | POA: Diagnosis not present

## 2018-11-16 DIAGNOSIS — F419 Anxiety disorder, unspecified: Secondary | ICD-10-CM | POA: Diagnosis not present

## 2018-11-16 DIAGNOSIS — G4700 Insomnia, unspecified: Secondary | ICD-10-CM | POA: Diagnosis not present

## 2018-11-16 DIAGNOSIS — M5030 Other cervical disc degeneration, unspecified cervical region: Secondary | ICD-10-CM | POA: Diagnosis not present

## 2018-11-23 DIAGNOSIS — H40001 Preglaucoma, unspecified, right eye: Secondary | ICD-10-CM | POA: Diagnosis not present

## 2018-11-23 DIAGNOSIS — E237 Disorder of pituitary gland, unspecified: Secondary | ICD-10-CM | POA: Diagnosis not present

## 2018-11-23 DIAGNOSIS — H40013 Open angle with borderline findings, low risk, bilateral: Secondary | ICD-10-CM | POA: Diagnosis not present

## 2018-11-23 DIAGNOSIS — H04123 Dry eye syndrome of bilateral lacrimal glands: Secondary | ICD-10-CM | POA: Diagnosis not present

## 2018-11-23 DIAGNOSIS — H2513 Age-related nuclear cataract, bilateral: Secondary | ICD-10-CM | POA: Diagnosis not present

## 2018-11-23 DIAGNOSIS — H16223 Keratoconjunctivitis sicca, not specified as Sjogren's, bilateral: Secondary | ICD-10-CM | POA: Diagnosis not present

## 2018-11-23 DIAGNOSIS — H40002 Preglaucoma, unspecified, left eye: Secondary | ICD-10-CM | POA: Diagnosis not present

## 2018-11-23 DIAGNOSIS — H524 Presbyopia: Secondary | ICD-10-CM | POA: Diagnosis not present

## 2018-12-21 DIAGNOSIS — Z6830 Body mass index (BMI) 30.0-30.9, adult: Secondary | ICD-10-CM | POA: Diagnosis not present

## 2018-12-21 DIAGNOSIS — E6609 Other obesity due to excess calories: Secondary | ICD-10-CM | POA: Diagnosis not present

## 2018-12-21 DIAGNOSIS — G4700 Insomnia, unspecified: Secondary | ICD-10-CM | POA: Diagnosis not present

## 2018-12-21 DIAGNOSIS — G894 Chronic pain syndrome: Secondary | ICD-10-CM | POA: Diagnosis not present

## 2019-01-18 DIAGNOSIS — T50905A Adverse effect of unspecified drugs, medicaments and biological substances, initial encounter: Secondary | ICD-10-CM | POA: Diagnosis not present

## 2019-01-18 DIAGNOSIS — G894 Chronic pain syndrome: Secondary | ICD-10-CM | POA: Diagnosis not present

## 2019-01-18 DIAGNOSIS — F419 Anxiety disorder, unspecified: Secondary | ICD-10-CM | POA: Diagnosis not present

## 2019-01-18 DIAGNOSIS — R110 Nausea: Secondary | ICD-10-CM | POA: Diagnosis not present

## 2019-02-19 DIAGNOSIS — G4700 Insomnia, unspecified: Secondary | ICD-10-CM | POA: Diagnosis not present

## 2019-03-04 DIAGNOSIS — M5030 Other cervical disc degeneration, unspecified cervical region: Secondary | ICD-10-CM | POA: Diagnosis not present

## 2019-03-04 DIAGNOSIS — G64 Other disorders of peripheral nervous system: Secondary | ICD-10-CM | POA: Diagnosis not present

## 2019-03-04 DIAGNOSIS — G894 Chronic pain syndrome: Secondary | ICD-10-CM | POA: Diagnosis not present

## 2019-03-04 DIAGNOSIS — F330 Major depressive disorder, recurrent, mild: Secondary | ICD-10-CM | POA: Diagnosis not present

## 2019-03-21 DIAGNOSIS — G894 Chronic pain syndrome: Secondary | ICD-10-CM | POA: Diagnosis not present

## 2019-03-30 DIAGNOSIS — F419 Anxiety disorder, unspecified: Secondary | ICD-10-CM | POA: Diagnosis not present

## 2019-03-30 DIAGNOSIS — G894 Chronic pain syndrome: Secondary | ICD-10-CM | POA: Diagnosis not present

## 2019-04-01 DIAGNOSIS — G64 Other disorders of peripheral nervous system: Secondary | ICD-10-CM | POA: Diagnosis not present

## 2019-04-01 DIAGNOSIS — G894 Chronic pain syndrome: Secondary | ICD-10-CM | POA: Diagnosis not present

## 2019-04-01 DIAGNOSIS — F4542 Pain disorder with related psychological factors: Secondary | ICD-10-CM | POA: Diagnosis not present

## 2019-04-01 DIAGNOSIS — M5030 Other cervical disc degeneration, unspecified cervical region: Secondary | ICD-10-CM | POA: Diagnosis not present

## 2019-04-09 NOTE — Progress Notes (Signed)
Referring Provider: Redmond School, MD Primary Care Physician:  Redmond School, MD Primary GI: Dr. Gala Romney   Chief Complaint  Patient presents with  . Diarrhea    some better    HPI:   Laura [name] is a 64 y.o. female presenting today with a history of chronic diarrhea, previously evaluated extensively and worked up for microscopic colitis and celiac disease. Ileocolonoscopy in July 2017 negative for microscopiccolitis. Felt to have IBS-D. Hep B core antibody positive in past but felt to be a false positive. Hep C antibody negative. 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 Started on Urso and elastography with minimal fibrosis. Recommended 1000 mg calcium and 1000 iu Vit D daily. Needs DEXA. Holding on liver biopsy unless worsening of LFTs +/- abnormal transaminases. Most recent labs scanned from Sept 2020 with Alk Phos 130, AST 19, ALT 23.   Due to diarrhea: recommended trial of Xifaxan, but this was not covered by insurance. Her pharmacy has found a way to cover this, and it will be faxed today after signed. Imodium without much help. Overall some improvement with diarrhea with some days no diarrhea. Notes anything greasy causes diarrhea. Lactose free diet trial but some days ok, sometimes not.   Has had cough for 2 months Tested for covid twice. Pepcid recommended by PCP. Will take three times per week. Cough usually at night when laying down mostly. May happen during the day. Doesn't lay down for 2-3 hours after eating. Rare snack. Aciphex BID.   Wants to hold off on DEXA until she checks with her insurance company.    Past Medical History:  Diagnosis Date  . Acromioclavicular joint arthritis    left shoulder  . Anxiety   . Cancer ([place])    skin (facial)   . GERD (gastroesophageal reflux disease)   . H/O hiatal hernia   . Hepatitis B antibody positive    [facility], Hep B core total ab positive  . History of  melanoma excision    01-05-2014  . Hypertension   . IBS (irritable bowel syndrome)   . Nocturnal hypoxia   . OA (osteoarthritis) of knee    RIGHT  . RLS (restless legs syndrome)   . Rotator cuff impingement syndrome of left shoulder   . Shortness of breath dyspnea   . Unspecified hereditary and idiopathic peripheral neuropathy     Past Surgical History:  Procedure Laterality Date  . ABDOMINAL HYSTERECTOMY    . ANTERIOR CERVICAL DECOMP/DISCECTOMY FUSION  2000   C4 -- C5  . BIOPSY  08/11/2015   Procedure: BIOPSY;  Surgeon: Daneil Dolin, MD;  [facility];  Service: Endoscopy;;  gastric polyp;  . cardiac cath     unremarkable  . CARPAL TUNNEL RELEASE Right 1995  . CHOLECYSTECTOMY  1996  . COLONOSCOPY  07/2007   Dr. Gala Romney: Terminal ileum normal. Status post segmental colonic mucosal biopsy.  . COLONOSCOPY WITH PROPOFOL N/A 08/11/2015   normal colonoscopy with benign colonic mucosa  . ESOPHAGOGASTRODUODENOSCOPY  07/2007   Dr. Gala Romney: Small hiatal hernia, duodenal biopsy unremarkable. Fundic gland gastric polyps.  . ESOPHAGOGASTRODUODENOSCOPY (EGD) WITH PROPOFOL N/A 08/11/2015   LA Grade A esophagitis, small hiatal hernia, few gastric polyps, normal duodenum (benign fundic gland polyp)  . KNEE ARTHROSCOPY WITH MEDIAL MENISECTOMY Right 05/25/2013   Procedure: RIGHT KNEE ARTHROSCOPY WITH PARTIAL LATERAL and MEDIAL MENISECTOMY AND CHONDROPLASTY;  Surgeon: Sydnee Cabal,  MD;  Location: [facility];  Service: Orthopedics;  Laterality: Right;  . LUMBAR DISC SURGERY  08-22-2003   LEFT  L5  --  S1  . MOHS SURGERY  01-05-2014   LEFT SIDE OF FACE  . SHOULDER ARTHROSCOPY WITH SUBACROMIAL DECOMPRESSION Left 01/29/2014   Procedure: LEFT SHOULDER ARTHROSCOPY WITH SUBACROMIAL DECOMPRESSION/DISTAL CLAVICLE RESECTION AND DEBRIDEMENT;  Surgeon: Sydnee Cabal, MD;  Location: [facility];  Service: Orthopedics;  Laterality: Left;  . TOTAL ABDOMINAL HYSTERECTOMY W/  BILATERAL SALPINGOOPHORECTOMY  1999    Current Outpatient Medications  Medication Sig Dispense Refill  . ALPRAZolam (XANAX) 0.5 MG tablet Take 0.5 mg by mouth as needed.    Marland Kitchen ascorbic acid (VITAMIN C) 500 MG tablet Take 500 mg by mouth 2 (two) times daily.    . cholecalciferol (VITAMIN D3) 25 MCG (1000 UNIT) tablet Take 1,000 Units by mouth daily.    Marland Kitchen CINNAMON PO Take by mouth daily.    . hydrochlorothiazide (MICROZIDE) 12.5 MG capsule Take 12.5 mg by mouth every morning.     Marland Kitchen losartan (COZAAR) 100 MG tablet Take 100 mg by mouth every morning.     . Omega-3 Fatty Acids (FISH OIL PO) Take by mouth daily.     . Oxycodone HCl 10 MG TABS Take 10 mg by mouth every 4 (four) hours as needed.  0  . RABEprazole (ACIPHEX) 20 MG tablet TAKE 1 TABLET BY MOUTH TWICE DAILY 60 tablet 5  . sucralfate (CARAFATE) 1 g tablet Take 1 g by mouth as needed.     . traZODone (DESYREL) 150 MG tablet Take 150 mg by mouth at bedtime.    . ursodiol (ACTIGALL) 500 MG tablet Take 1 tablet (500 mg total) by mouth 2 (two) times daily. 60 tablet 5  . zolpidem (AMBIEN) 10 MG tablet Take 1 tablet by mouth as needed.     No current facility-administered medications for this visit.    Allergies as of 04/10/2019 - Review Complete 04/10/2019  Allergen Reaction Noted  . Aspirin Other (See Comments) 06/16/2015  . Celebrex [celecoxib] Hives 07/27/2012    Family History  Problem Relation Age of Onset  . Lung cancer Mother   . HIV/AIDS Father   . Crohn's disease Cousin   . Breast cancer Maternal Aunt   . Sjogren's syndrome Sister   . Diabetes Brother   . Heart attack Daughter   . Drug abuse Son   . ADD / ADHD Grandchild   . Colon cancer Neg Hx     Social History   Socioeconomic History  . Marital status: Married    Spouse name: [name]  . Number of children: 3  . Years of education: GED  . Highest education level: Not on file  Occupational History    Comment: not employed  Tobacco Use  . Smoking status:  Former Smoker    Packs/day: 1.00    Years: 20.00    Pack years: 20.00    Types: Cigarettes    Quit date: 01/26/2015    Years since quitting: 4.2  . Smokeless tobacco: Never Used  Substance and Sexual Activity  . Alcohol use: No    Alcohol/week: 0.0 standard drinks  . Drug use: No  . Sexual activity: Not on file  Other Topics Concern  . Not on file  Social History Narrative   Patient lives at home with her husband [name]).    Patient is not working as time but she trying for disability.   Education  10th grade   Caffeine daily coffee and mountain dew . One can daily.   .      Social Determinants of Health   Financial Resource Strain:   . Difficulty of Paying Living Expenses: Not on file  Food Insecurity:   . Worried About Charity fundraiser in the Last Year: Not on file  . Ran Out of Food in the Last Year: Not on file  Transportation Needs:   . Lack of Transportation (Medical): Not on file  . Lack of Transportation (Non-Medical): Not on file  Physical Activity:   . Days of Exercise per Week: Not on file  . Minutes of Exercise per Session: Not on file  Stress:   . Feeling of Stress : Not on file  Social Connections:   . Frequency of Communication with Friends and Family: Not on file  . Frequency of Social Gatherings with Friends and Family: Not on file  . Attends Religious Services: Not on file  . Active Member of Clubs or Organizations: Not on file  . Attends Archivist Meetings: Not on file  . Marital Status: Not on file    Review of Systems: Gen: Denies fever, chills, anorexia. Denies fatigue, weakness, weight loss.  CV: Denies chest pain, palpitations, syncope, peripheral edema, and claudication. Resp: Denies dyspnea at rest, cough, wheezing, coughing up blood, and pleurisy. GI: see HPI Derm: Denies rash, itching, dry skin Psych: Denies depression, anxiety, memory loss, confusion. No homicidal or suicidal ideation.  Heme: Denies bruising, bleeding,  and enlarged lymph nodes.  Physical Exam: BP 127/77   Pulse 87   Temp (!) 97.3 F (36.3 C) (Temporal)   Ht _0  (1.575 m)   Wt 166 lb 6.4 oz (75.5 kg)   BMI 30.43 kg/m  General:   Alert and oriented. No distress noted. Pleasant and cooperative.  Head:  Normocephalic and atraumatic. Eyes:  Conjuctiva clear without scleral icterus. Abdomen:  +BS, soft, non-tender and non-distended. No rebound or guarding. No HSM or masses noted. Msk:  Symmetrical without gross deformities. Normal posture. Extremities:  Without edema. Neurologic:  Alert and  oriented x4 Psych:  Alert and cooperative. Normal mood and affect.  ASSESSMENT: Mystique Bjelland is a 64 y.o. 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  PBC: continue Urso. Discussed DEXA scan, but she would like to hold off on until she talks to insurance. Elastography F0/F1 in past. No liver biopsy unless bump in LFTs. Requesting most recent labs done by PCP.  Chronic diarrhea: s/p colonoscopy/EGD in past, negative duodenal biopsies and colonic biopsies, felt to likely have IBS-D. As planned, pursue round of Xifaxan. Forms signed for patient assistance and to be faxed back to [facility]. If this is not helpful, consider Colestid. Bentyl and Imodium without much improvement. Continue with dietary modifications.  GERD: controlled on Aciphex BID. Intermittent cough at night and occasionally during the day. Already starting on Pepcid in evenings but taking sporadically. Discussed dietary changes, take Pepcid in evenings until being seen by PCP. Could be secondary to GERD but unable to rule out other causes.    PLAN:   Trial of Xifaxan  Continue Aciphex BID, Pepcid in evenings  Call when able to pursue DEXA scan  Continue Urso BID  Retrieve most recent labs from PCP  Return in 6 months  Annitta Needs, PhD,  [facility] Gastroenterology

## 2019-04-10 ENCOUNTER — Ambulatory Visit (INDEPENDENT_AMBULATORY_CARE_PROVIDER_SITE_OTHER): Payer: PPO | Admitting: Gastroenterology

## 2019-04-10 ENCOUNTER — Encounter: Payer: Self-pay | Admitting: Gastroenterology

## 2019-04-10 ENCOUNTER — Other Ambulatory Visit: Payer: Self-pay

## 2019-04-10 ENCOUNTER — Telehealth: Payer: Self-pay

## 2019-04-10 VITALS — BP 127/77 | HR 87 | Temp 97.3°F | Ht 62.0 in | Wt 166.4 lb

## 2019-04-10 DIAGNOSIS — R197 Diarrhea, unspecified: Secondary | ICD-10-CM | POA: Diagnosis not present

## 2019-04-10 DIAGNOSIS — K743 Primary biliary cirrhosis: Secondary | ICD-10-CM | POA: Diagnosis not present

## 2019-04-10 NOTE — Telephone Encounter (Signed)
Pt assistance forms were faxed to [facility] per their request.

## 2019-04-10 NOTE — Patient Instructions (Signed)
We have the form!! Faxing today back to the pharmacy. Let me know when you finish this and how you are doing.  Take Pepcid each evening until you see your primary care.    We will see you back in 6 months!!  I enjoyed seeing you again today! As you know, I value our relationship and want to provide genuine, compassionate, and quality care. I welcome your feedback. If you receive a survey regarding your visit,  I greatly appreciate you taking time to fill this out. See you next time!  Annitta Needs, PhD, ANP-BC [facility] Gastroenterology

## 2019-04-19 DIAGNOSIS — Z0000 Encounter for general adult medical examination without abnormal findings: Secondary | ICD-10-CM | POA: Diagnosis not present

## 2019-04-19 DIAGNOSIS — E6609 Other obesity due to excess calories: Secondary | ICD-10-CM | POA: Diagnosis not present

## 2019-04-19 DIAGNOSIS — Z1389 Encounter for screening for other disorder: Secondary | ICD-10-CM | POA: Diagnosis not present

## 2019-04-19 DIAGNOSIS — G894 Chronic pain syndrome: Secondary | ICD-10-CM | POA: Diagnosis not present

## 2019-04-19 DIAGNOSIS — M5030 Other cervical disc degeneration, unspecified cervical region: Secondary | ICD-10-CM | POA: Diagnosis not present

## 2019-04-19 DIAGNOSIS — Z6830 Body mass index (BMI) 30.0-30.9, adult: Secondary | ICD-10-CM | POA: Diagnosis not present

## 2019-04-19 DIAGNOSIS — R7309 Other abnormal glucose: Secondary | ICD-10-CM | POA: Diagnosis not present

## 2019-04-23 NOTE — Telephone Encounter (Signed)
Received approval letter from [facility] pt assistance company. Pt was approved through 02/01/20. Pt is aware of approval. Approval letter will be scanned in pts chart.

## 2019-05-02 DIAGNOSIS — G64 Other disorders of peripheral nervous system: Secondary | ICD-10-CM | POA: Diagnosis not present

## 2019-05-02 DIAGNOSIS — F4542 Pain disorder with related psychological factors: Secondary | ICD-10-CM | POA: Diagnosis not present

## 2019-05-02 DIAGNOSIS — M5030 Other cervical disc degeneration, unspecified cervical region: Secondary | ICD-10-CM | POA: Diagnosis not present

## 2019-05-02 DIAGNOSIS — G894 Chronic pain syndrome: Secondary | ICD-10-CM | POA: Diagnosis not present

## 2019-05-07 ENCOUNTER — Telehealth: Payer: Self-pay | Admitting: Gastroenterology

## 2019-05-07 ENCOUNTER — Other Ambulatory Visit: Payer: Self-pay | Admitting: Nurse Practitioner

## 2019-05-07 NOTE — Telephone Encounter (Signed)
Lmom, waiting on a return call.  

## 2019-05-07 NOTE — Telephone Encounter (Signed)
Received outside labs dated Feb 2021:  Hgb 14.2, Hct 41.2, Tbili 0.5, Alk Phos 95, AST 17, ALT 14. TSH 0.510.   To be scanned. Alk Phos improved. Transaminases normal.  LFTs in 6 months. We will order at next visit.

## 2019-05-14 NOTE — Telephone Encounter (Signed)
Spoke with pt. Pt notified of results and will repeat labs at next ov.

## 2019-05-14 NOTE — Telephone Encounter (Signed)
Spoke with pt the patient notified of results and we will repeat labs at next apt.

## 2019-05-18 ENCOUNTER — Other Ambulatory Visit (HOSPITAL_COMMUNITY): Payer: Self-pay | Admitting: Internal Medicine

## 2019-05-18 DIAGNOSIS — Z681 Body mass index (BMI) 19 or less, adult: Secondary | ICD-10-CM | POA: Diagnosis not present

## 2019-05-18 DIAGNOSIS — R059 Cough, unspecified: Secondary | ICD-10-CM

## 2019-05-18 DIAGNOSIS — K219 Gastro-esophageal reflux disease without esophagitis: Secondary | ICD-10-CM | POA: Diagnosis not present

## 2019-05-18 DIAGNOSIS — G894 Chronic pain syndrome: Secondary | ICD-10-CM | POA: Diagnosis not present

## 2019-05-18 DIAGNOSIS — R05 Cough: Secondary | ICD-10-CM | POA: Diagnosis not present

## 2019-05-18 DIAGNOSIS — I10 Essential (primary) hypertension: Secondary | ICD-10-CM | POA: Diagnosis not present

## 2019-05-24 ENCOUNTER — Telehealth: Payer: Self-pay

## 2019-05-24 ENCOUNTER — Telehealth: Payer: Self-pay | Admitting: Internal Medicine

## 2019-05-24 NOTE — Telephone Encounter (Signed)
Error

## 2019-05-24 NOTE — Telephone Encounter (Signed)
Laura [name] ([place]) [phone] called. Laura [name] helps pts PCP manage pts health/medications. Pt would like a refill of Xifaxan. Pt feels the medication is helping her with loose stools. Pt was given Xifaxan 04/2019 (2 week trial). Please advise if pt can continue this medication. Pt assistance was approved for this patient when medication was prescribed.

## 2019-05-28 MED ORDER — RIFAXIMIN 550 MG PO TABS: 550.0000 mg | ORAL_TABLET | Freq: Three times a day (TID) | ORAL | 0 refills | Status: AC

## 2019-05-28 NOTE — Telephone Encounter (Signed)
Pt returned call and is aware that Xifaxan RX was sent to the pharmacy. The pharmacy will arrange pts shipment with pt assistance.

## 2019-05-28 NOTE — Telephone Encounter (Signed)
Noted. Faxed RX to [phone] per pt assistance company. Lmom, waiting on a return call. Will let pt know that it was faxed to pharmacy.

## 2019-05-28 NOTE — Addendum Note (Signed)
Addended by: Annitta Needs on: 05/28/2019 02:10 PM   Modules accepted: Orders

## 2019-05-28 NOTE — Telephone Encounter (Signed)
Printed prescription. May take 1 tablet TID for 14 days.

## 2019-06-01 DIAGNOSIS — M5030 Other cervical disc degeneration, unspecified cervical region: Secondary | ICD-10-CM | POA: Diagnosis not present

## 2019-06-01 DIAGNOSIS — G64 Other disorders of peripheral nervous system: Secondary | ICD-10-CM | POA: Diagnosis not present

## 2019-06-01 DIAGNOSIS — F4542 Pain disorder with related psychological factors: Secondary | ICD-10-CM | POA: Diagnosis not present

## 2019-06-01 DIAGNOSIS — G894 Chronic pain syndrome: Secondary | ICD-10-CM | POA: Diagnosis not present

## 2019-06-06 NOTE — Progress Notes (Deleted)
Office Visit Note  Patient: Laura [name]             Date of Birth: 01-02-1956           MRN: [id number]             PCP: Redmond School, MD Referring: Redmond School, MD Visit Date: 06/13/2019 Occupation: @GUAROCC @  Subjective:  No chief complaint on file.   History of Present Illness: Laura [name] is a 64 y.o. female ***   Activities of Daily Living:  Patient reports morning stiffness for *** {minute/hour:19697}.   Patient {ACTIONS;DENIES/REPORTS:21021675::"Denies"} nocturnal pain.  Difficulty dressing/grooming: {ACTIONS;DENIES/REPORTS:21021675::"Denies"} Difficulty climbing stairs: {ACTIONS;DENIES/REPORTS:21021675::"Denies"} Difficulty getting out of chair: {ACTIONS;DENIES/REPORTS:21021675::"Denies"} Difficulty using hands for taps, buttons, cutlery, and/or writing: {ACTIONS;DENIES/REPORTS:21021675::"Denies"}  No Rheumatology ROS completed.   PMFS History:  Patient Active Problem List   Diagnosis Date Noted  . Former smoker 06/15/2017  . Essential hypertension 06/15/2017  . DDD (degenerative disc disease), cervical 06/15/2017  . DDD (degenerative disc disease), lumbar 06/15/2017  . Primary biliary cholangitis ([place]) 02/18/2017  . Chronic diarrhea   . Reflux esophagitis   . Gastric polyp   . Upper airway cough syndrome 08/07/2015  . Chronic respiratory failure with hypoxia (HCC)/noct hypoxemia 08/07/2015  . GERD (gastroesophageal reflux disease) 07/07/2015  . Diarrhea 07/07/2015  . Hepatitis B antibody positive 07/07/2015  . Loss of weight 07/07/2015  . Paresthesia 02/27/2015  . S/P arthroscopy of shoulder 01/29/2014  . S/P right knee arthroscopy 05/25/2013  . Hereditary and idiopathic peripheral neuropathy 07/27/2012    Past Medical History:  Diagnosis Date  . Acromioclavicular joint arthritis    left shoulder  . Anxiety   . 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 GERD (gastroesophageal reflux disease)   . H/O hiatal hernia   . Hepatitis B antibody positive     letter from [facility], Hep B core total ab positive  . History of melanoma excision    01-05-2014  . Hypertension   . IBS (irritable bowel syndrome)   . Nocturnal hypoxia   . OA (osteoarthritis) of knee    RIGHT  . RLS (restless legs syndrome)   . Rotator cuff impingement syndrome of left shoulder   . Shortness of breath dyspnea   . Unspecified hereditary and idiopathic peripheral neuropathy     Family History  Problem Relation Age of Onset  . Lung cancer Mother   . HIV/AIDS Father   . Crohn's disease Cousin   . Breast cancer Maternal Aunt   . Sjogren's syndrome Sister   . Diabetes Brother   . Heart attack Daughter   . Drug abuse Son   . ADD / ADHD Grandchild   . Colon cancer Neg Hx    Past Surgical History:  Procedure Laterality Date  . ABDOMINAL HYSTERECTOMY    . ANTERIOR CERVICAL DECOMP/DISCECTOMY FUSION  2000   C4 -- C5  . BIOPSY  08/11/2015   Procedure: BIOPSY;  Surgeon: Daneil Dolin, MD;  [facility];  Service: Endoscopy;;  gastric polyp;  . cardiac cath     unremarkable  . CARPAL TUNNEL RELEASE Right 1995  . CHOLECYSTECTOMY  1996  . COLONOSCOPY  07/2007   Dr. Gala Romney: Terminal ileum normal. Status post segmental colonic mucosal biopsy.  . COLONOSCOPY WITH PROPOFOL N/A 08/11/2015   normal colonoscopy with benign colonic mucosa  . ESOPHAGOGASTRODUODENOSCOPY  07/2007   Dr. Gala Romney: Small hiatal hernia, duodenal biopsy unremarkable. Fundic gland gastric polyps.  . ESOPHAGOGASTRODUODENOSCOPY (EGD) WITH  PROPOFOL N/A 08/11/2015   LA Grade A esophagitis, small hiatal hernia, few gastric polyps, normal duodenum (benign fundic gland polyp)  . KNEE ARTHROSCOPY WITH MEDIAL MENISECTOMY Right 05/25/2013   Procedure: RIGHT KNEE ARTHROSCOPY WITH PARTIAL LATERAL and MEDIAL MENISECTOMY AND CHONDROPLASTY;  Surgeon: Sydnee Cabal, MD;  Location: [facility];  Service: Orthopedics;  Laterality: Right;  . LUMBAR DISC SURGERY  08-22-2003   LEFT  L5  --  S1  . MOHS  SURGERY  01-05-2014   LEFT SIDE OF FACE  . SHOULDER ARTHROSCOPY WITH SUBACROMIAL DECOMPRESSION Left 01/29/2014   Procedure: LEFT SHOULDER ARTHROSCOPY WITH SUBACROMIAL DECOMPRESSION/DISTAL CLAVICLE RESECTION AND DEBRIDEMENT;  Surgeon: Sydnee Cabal, MD;  Location: [facility];  Service: Orthopedics;  Laterality: Left;  . TOTAL ABDOMINAL HYSTERECTOMY W/ BILATERAL SALPINGOOPHORECTOMY  1999   Social History   Social History Narrative   Patient lives at home with her husband [name]).    Patient is not working as time but she trying for disability.   Education 10th grade   Caffeine daily coffee and mountain dew . One can daily.   .      Immunization History  Administered Date(s) Administered  . Influenza-Unspecified 12/02/2017     Objective: Vital Signs: There were no vitals taken for this visit.   Physical Exam   Musculoskeletal Exam: ***  CDAI Exam: CDAI Score: -- Patient Global: --; Provider Global: -- Swollen: --; Tender: -- Joint Exam 06/13/2019   No joint exam has been documented for this visit   There is currently no information documented on the homunculus. Go to the Rheumatology activity and complete the homunculus joint exam.  Investigation: No additional findings.  Imaging: No results found.  Recent Labs: Lab Results  Component Value Date   WBC 7.9 08/07/2015   HGB 14.6 08/07/2015   PLT 304.0 08/07/2015   NA 138 08/04/2015   K 3.6 08/04/2015   CL 104 08/04/2015   CO2 23 08/04/2015   GLUCOSE 155 (H) 08/04/2015   BUN 20 08/04/2015   CREATININE 0.90 02/03/2017   BILITOT 0.4 09/30/2017   ALKPHOS 117 09/30/2017   AST 18 09/30/2017   ALT 24 09/30/2017   PROT 6.8 09/30/2017   ALBUMIN 4.2 09/30/2017   CALCIUM 9.1 08/04/2015   GFRAA >60 08/04/2015    Speciality Comments: No specialty comments available.  Procedures:  No procedures performed Allergies: Aspirin and Celebrex [celecoxib]   Assessment / Plan:     Visit Diagnoses: No  diagnosis found.  Orders: No orders of the defined types were placed in this encounter.  No orders of the defined types were placed in this encounter.   Face-to-face time spent with patient was *** minutes. Greater than 50% of time was spent in counseling and coordination of care.  Follow-Up Instructions: No follow-ups on file.   Ofilia Neas, PA-C  Note - This record has been created using Dragon software.  Chart creation errors have been sought, but may not always  have been located. Such creation errors do not reflect on  the standard of medical care.

## 2019-06-13 ENCOUNTER — Ambulatory Visit: Payer: PPO | Admitting: Rheumatology

## 2019-06-13 DIAGNOSIS — R768 Other specified abnormal immunological findings in serum: Secondary | ICD-10-CM

## 2019-06-13 DIAGNOSIS — I10 Essential (primary) hypertension: Secondary | ICD-10-CM

## 2019-06-13 DIAGNOSIS — K743 Primary biliary cirrhosis: Secondary | ICD-10-CM

## 2019-06-13 DIAGNOSIS — M5030 Other cervical disc degeneration, unspecified cervical region: Secondary | ICD-10-CM

## 2019-06-13 DIAGNOSIS — Z87891 Personal history of nicotine dependence: Secondary | ICD-10-CM

## 2019-06-13 DIAGNOSIS — Z8719 Personal history of other diseases of the digestive system: Secondary | ICD-10-CM

## 2019-06-13 DIAGNOSIS — Z9889 Other specified postprocedural states: Secondary | ICD-10-CM

## 2019-06-13 DIAGNOSIS — G609 Hereditary and idiopathic neuropathy, unspecified: Secondary | ICD-10-CM

## 2019-06-13 DIAGNOSIS — Z85820 Personal history of malignant melanoma of skin: Secondary | ICD-10-CM

## 2019-06-13 DIAGNOSIS — K760 Fatty (change of) liver, not elsewhere classified: Secondary | ICD-10-CM

## 2019-06-13 DIAGNOSIS — M5136 Other intervertebral disc degeneration, lumbar region: Secondary | ICD-10-CM

## 2019-06-19 DIAGNOSIS — G894 Chronic pain syndrome: Secondary | ICD-10-CM | POA: Diagnosis not present

## 2019-06-19 DIAGNOSIS — Z6828 Body mass index (BMI) 28.0-28.9, adult: Secondary | ICD-10-CM | POA: Diagnosis not present

## 2019-06-19 DIAGNOSIS — E663 Overweight: Secondary | ICD-10-CM | POA: Diagnosis not present

## 2019-06-19 DIAGNOSIS — R05 Cough: Secondary | ICD-10-CM | POA: Diagnosis not present

## 2019-07-09 NOTE — Telephone Encounter (Signed)
It is not indicated for continued use in this situation. She has had 2 rounds fairly recently. If still having issues, please have her make an appt.

## 2019-07-09 NOTE — Telephone Encounter (Signed)
Noted. Spoke with pt. Pt will take all of the medicine she has and will call back to schedule an apt if needed.

## 2019-07-09 NOTE — Telephone Encounter (Signed)
Pt is going out of town next week and wants to continue the [place]. Please advise if a refill is ok? Pt is receiving pt assistance and can get the medication through them.

## 2019-07-12 DIAGNOSIS — Z6828 Body mass index (BMI) 28.0-28.9, adult: Secondary | ICD-10-CM | POA: Diagnosis not present

## 2019-07-12 DIAGNOSIS — E663 Overweight: Secondary | ICD-10-CM | POA: Diagnosis not present

## 2019-07-12 DIAGNOSIS — J301 Allergic rhinitis due to pollen: Secondary | ICD-10-CM | POA: Diagnosis not present

## 2019-07-12 DIAGNOSIS — G894 Chronic pain syndrome: Secondary | ICD-10-CM | POA: Diagnosis not present

## 2019-08-01 DIAGNOSIS — M5030 Other cervical disc degeneration, unspecified cervical region: Secondary | ICD-10-CM | POA: Diagnosis not present

## 2019-08-01 DIAGNOSIS — F4542 Pain disorder with related psychological factors: Secondary | ICD-10-CM | POA: Diagnosis not present

## 2019-08-01 DIAGNOSIS — G894 Chronic pain syndrome: Secondary | ICD-10-CM | POA: Diagnosis not present

## 2019-08-01 DIAGNOSIS — G64 Other disorders of peripheral nervous system: Secondary | ICD-10-CM | POA: Diagnosis not present

## 2019-08-16 DIAGNOSIS — F419 Anxiety disorder, unspecified: Secondary | ICD-10-CM | POA: Diagnosis not present

## 2019-08-16 DIAGNOSIS — Z23 Encounter for immunization: Secondary | ICD-10-CM | POA: Diagnosis not present

## 2019-08-16 DIAGNOSIS — I10 Essential (primary) hypertension: Secondary | ICD-10-CM | POA: Diagnosis not present

## 2019-08-16 DIAGNOSIS — G894 Chronic pain syndrome: Secondary | ICD-10-CM | POA: Diagnosis not present

## 2019-08-16 DIAGNOSIS — Z6827 Body mass index (BMI) 27.0-27.9, adult: Secondary | ICD-10-CM | POA: Diagnosis not present

## 2019-08-16 DIAGNOSIS — K219 Gastro-esophageal reflux disease without esophagitis: Secondary | ICD-10-CM | POA: Diagnosis not present

## 2019-08-31 DIAGNOSIS — F4542 Pain disorder with related psychological factors: Secondary | ICD-10-CM | POA: Diagnosis not present

## 2019-08-31 DIAGNOSIS — M5030 Other cervical disc degeneration, unspecified cervical region: Secondary | ICD-10-CM | POA: Diagnosis not present

## 2019-08-31 DIAGNOSIS — G894 Chronic pain syndrome: Secondary | ICD-10-CM | POA: Diagnosis not present

## 2019-08-31 DIAGNOSIS — G64 Other disorders of peripheral nervous system: Secondary | ICD-10-CM | POA: Diagnosis not present

## 2019-09-13 DIAGNOSIS — E663 Overweight: Secondary | ICD-10-CM | POA: Diagnosis not present

## 2019-09-13 DIAGNOSIS — Z6827 Body mass index (BMI) 27.0-27.9, adult: Secondary | ICD-10-CM | POA: Diagnosis not present

## 2019-09-13 DIAGNOSIS — G894 Chronic pain syndrome: Secondary | ICD-10-CM | POA: Diagnosis not present

## 2019-10-02 DIAGNOSIS — E7849 Other hyperlipidemia: Secondary | ICD-10-CM | POA: Diagnosis not present

## 2019-10-02 DIAGNOSIS — F4542 Pain disorder with related psychological factors: Secondary | ICD-10-CM | POA: Diagnosis not present

## 2019-10-02 DIAGNOSIS — G894 Chronic pain syndrome: Secondary | ICD-10-CM | POA: Diagnosis not present

## 2019-10-02 DIAGNOSIS — G64 Other disorders of peripheral nervous system: Secondary | ICD-10-CM | POA: Diagnosis not present

## 2019-10-02 DIAGNOSIS — M5030 Other cervical disc degeneration, unspecified cervical region: Secondary | ICD-10-CM | POA: Diagnosis not present

## 2019-10-04 DIAGNOSIS — H2513 Age-related nuclear cataract, bilateral: Secondary | ICD-10-CM | POA: Diagnosis not present

## 2019-10-04 DIAGNOSIS — H40013 Open angle with borderline findings, low risk, bilateral: Secondary | ICD-10-CM | POA: Diagnosis not present

## 2019-10-04 DIAGNOSIS — H04123 Dry eye syndrome of bilateral lacrimal glands: Secondary | ICD-10-CM | POA: Diagnosis not present

## 2019-10-04 DIAGNOSIS — H16223 Keratoconjunctivitis sicca, not specified as Sjogren's, bilateral: Secondary | ICD-10-CM | POA: Diagnosis not present

## 2019-10-15 DIAGNOSIS — G894 Chronic pain syndrome: Secondary | ICD-10-CM | POA: Diagnosis not present

## 2019-10-15 DIAGNOSIS — K580 Irritable bowel syndrome with diarrhea: Secondary | ICD-10-CM | POA: Diagnosis not present

## 2019-10-15 DIAGNOSIS — Z6827 Body mass index (BMI) 27.0-27.9, adult: Secondary | ICD-10-CM | POA: Diagnosis not present

## 2019-10-15 DIAGNOSIS — K219 Gastro-esophageal reflux disease without esophagitis: Secondary | ICD-10-CM | POA: Diagnosis not present

## 2019-10-17 ENCOUNTER — Other Ambulatory Visit: Payer: Self-pay

## 2019-10-17 ENCOUNTER — Ambulatory Visit (INDEPENDENT_AMBULATORY_CARE_PROVIDER_SITE_OTHER): Payer: PPO | Admitting: Gastroenterology

## 2019-10-17 ENCOUNTER — Encounter: Payer: Self-pay | Admitting: Gastroenterology

## 2019-10-17 VITALS — BP 142/86 | HR 92 | Temp 97.3°F | Ht 61.0 in | Wt 152.8 lb

## 2019-10-17 DIAGNOSIS — K2100 Gastro-esophageal reflux disease with esophagitis, without bleeding: Secondary | ICD-10-CM

## 2019-10-17 DIAGNOSIS — K743 Primary biliary cirrhosis: Secondary | ICD-10-CM | POA: Diagnosis not present

## 2019-10-17 NOTE — Progress Notes (Signed)
Cc'ed to pcp °

## 2019-10-17 NOTE — Patient Instructions (Signed)
Please let me know if nausea recurs.  I am requesting labs from your doctor.   We will see you in 6 months!  I enjoyed seeing you again today! As you know, I value our relationship and want to provide genuine, compassionate, and quality care. I welcome your feedback. If you receive a survey regarding your visit,  I greatly appreciate you taking time to fill this out. See you next time!  Annitta Needs, PhD, ANP-BC [facility] Gastroenterology

## 2019-10-17 NOTE — Progress Notes (Addendum)
Referring Provider: Redmond School, MD Primary Care Physician:  Redmond School, MD Primary GI: Dr. Gala Romney   Chief Complaint  Patient presents with  . biliary cholangitis    f/u. Doing okay  . Diarrhea    better but still has it    HPI:   Laura [name] is a 64 y.o. female presenting today with a history of chronic diarrhea, previously evaluated extensively and worked up for microscopic colitis and celiac disease. Ileocolonoscopy in July 2017 negative for microscopiccolitis. Felt to have IBS-D. Hep B core antibody positive in past but felt to be a false positive. Hep C antibody negative. 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 Started on Urso and elastography with minimal fibrosis. Recommended 1000 mg calcium and 1000 iu Vit D daily. Needs DEXA. Holding on liver biopsy unless worsening of LFTs +/- abnormal transaminases. Outside labs dated Feb 2021: alk phos 95, transaminases normal, no anemia on CBC. LFTS recently completed this week with PCP; we have requested.    Purposefully losing weight. Has DEXA scan scheduled but can't have done till January. Mammogram in November. Nervous today, stating she usually gets nervous going to appointments as she is worried that something will be wrong.   Course of Xifaxan helped with diarrhea. Certain foods will cause her to have diarrhea. Hard to pinpoint. Sometimes constipation. Cereal sometimes runs her off. Ice cream will do the same way. Some gas and bloating at times. No rectal bleeding. Some nausea once to twice a week recently. Can't pinpoint triggers. Sometimes waking with it, sometimes later in the evening. Hasn't had nausea in a week. Feels like something is sucking up her energy. Aciphex BID. Not eating late at night.   Past Medical History:  Diagnosis Date  . Acromioclavicular joint arthritis    left shoulder  . Anxiety   . 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 GERD (gastroesophageal reflux  disease)   . H/O hiatal hernia   . Hepatitis B antibody positive    letter from [facility], Hep B core total ab positive  . History of melanoma excision    01-05-2014  . Hypertension   . IBS (irritable bowel syndrome)   . Nocturnal hypoxia   . OA (osteoarthritis) of knee    RIGHT  . RLS (restless legs syndrome)   . Rotator cuff impingement syndrome of left shoulder   . Shortness of breath dyspnea   . Unspecified hereditary and idiopathic peripheral neuropathy     Past Surgical History:  Procedure Laterality Date  . ABDOMINAL HYSTERECTOMY    . ANTERIOR CERVICAL DECOMP/DISCECTOMY FUSION  2000   C4 -- C5  . BIOPSY  08/11/2015   Procedure: BIOPSY;  Surgeon: Daneil Dolin, MD;  [facility];  Service: Endoscopy;;  gastric polyp;  . cardiac cath     unremarkable  . CARPAL TUNNEL RELEASE Right 1995  . CHOLECYSTECTOMY  1996  . COLONOSCOPY  07/2007   Dr. Gala Romney: Terminal ileum normal. Status post segmental colonic mucosal biopsy.  . COLONOSCOPY WITH PROPOFOL N/A 08/11/2015   normal colonoscopy with benign colonic mucosa  . ESOPHAGOGASTRODUODENOSCOPY  07/2007   Dr. Gala Romney: Small hiatal hernia, duodenal biopsy unremarkable. Fundic gland gastric polyps.  . ESOPHAGOGASTRODUODENOSCOPY (EGD) WITH PROPOFOL N/A 08/11/2015   LA Grade A esophagitis, small hiatal hernia, few gastric polyps, normal duodenum (benign fundic gland polyp)  . KNEE ARTHROSCOPY WITH MEDIAL MENISECTOMY Right 05/25/2013  Procedure: RIGHT KNEE ARTHROSCOPY WITH PARTIAL LATERAL and MEDIAL MENISECTOMY AND CHONDROPLASTY;  Surgeon: Sydnee Cabal, MD;  Location: [place];  Service: Orthopedics;  Laterality: Right;  . LUMBAR DISC SURGERY  08-22-2003   LEFT  L5  --  S1  . MOHS SURGERY  01-05-2014   LEFT SIDE OF FACE  . SHOULDER ARTHROSCOPY WITH SUBACROMIAL DECOMPRESSION Left 01/29/2014   Procedure: LEFT SHOULDER ARTHROSCOPY WITH SUBACROMIAL DECOMPRESSION/DISTAL CLAVICLE RESECTION AND DEBRIDEMENT;  Surgeon:  Sydnee Cabal, MD;  Location: [facility];  Service: Orthopedics;  Laterality: Left;  . TOTAL ABDOMINAL HYSTERECTOMY W/ BILATERAL SALPINGOOPHORECTOMY  1999    Current Outpatient Medications  Medication Sig Dispense Refill  . ALPRAZolam (XANAX) 0.5 MG tablet Take 0.5 mg by mouth as needed.    Marland Kitchen ascorbic acid (VITAMIN C) 500 MG tablet Take 500 mg by mouth 2 (two) times daily.    . cholecalciferol (VITAMIN D3) 25 MCG (1000 UNIT) tablet Take 1,000 Units by mouth daily.    . hydrochlorothiazide (MICROZIDE) 12.5 MG capsule Take 12.5 mg by mouth every morning.     . Omega-3 Fatty Acids (FISH OIL PO) Take by mouth daily.     . RABEprazole (ACIPHEX) 20 MG tablet TAKE 1 TABLET BY MOUTH TWICE DAILY 60 tablet 5  . traZODone (DESYREL) 150 MG tablet Take 150 mg by mouth at bedtime.    . ursodiol (ACTIGALL) 500 MG tablet Take 1 tablet (500 mg total) by mouth 2 (two) times daily. 60 tablet 5   No current facility-administered medications for this visit.    Allergies as of 10/17/2019 - Review Complete 10/17/2019  Allergen Reaction Noted  . Aspirin Other (See Comments) 06/16/2015  . Celebrex [celecoxib] Hives 07/27/2012    Family History  Problem Relation Age of Onset  . Lung cancer Mother   . HIV/AIDS Father   . Crohn's disease Cousin   . Breast cancer Maternal Aunt   . Sjogren's syndrome Sister   . Diabetes Brother   . Heart attack Daughter   . Drug abuse Son   . ADD / ADHD Grandchild   . Colon cancer Neg Hx     Social History   Socioeconomic History  . Marital status: Married    Spouse name: Laura [name]  . Number of children: 3  . Years of education: GED  . Highest education level: Not on file  Occupational History    Comment: not employed  Tobacco Use  . Smoking status: Former Smoker    Packs/day: 1.00    Years: 20.00    Pack years: 20.00    Types: Cigarettes    Quit date: 01/26/2015    Years since quitting: 4.7  . Smokeless tobacco: Never Used  Substance and  Sexual Activity  . Alcohol use: No    Alcohol/week: 0.0 standard drinks  . Drug use: No  . Sexual activity: Not on file  Other Topics Concern  . Not on file  Social History Narrative   Patient lives at home with her husband Laura [name]).    Patient is not working as time but she trying for disability.   Education 10th grade   Caffeine daily coffee and mountain dew . One can daily.   .      Social Determinants of Health   Financial Resource Strain:   . Difficulty of Paying Living Expenses: Not on file  Food Insecurity:   . Worried About Charity fundraiser in the Last Year: Not on file  . Ran  Out of Food in the Last Year: Not on file  Transportation Needs:   . Lack of Transportation (Medical): Not on file  . Lack of Transportation (Non-Medical): Not on file  Physical Activity:   . Days of Exercise per Week: Not on file  . Minutes of Exercise per Session: Not on file  Stress:   . Feeling of Stress : Not on file  Social Connections:   . Frequency of Communication with Friends and Family: Not on file  . Frequency of Social Gatherings with Friends and Family: Not on file  . Attends Religious Services: Not on file  . Active Member of Clubs or Organizations: Not on file  . Attends Archivist Meetings: Not on file  . Marital Status: Not on file    Review of Systems: Gen: Denies fever, chills, anorexia. Denies fatigue, weakness, weight loss.  CV: Denies chest pain, palpitations, syncope, peripheral edema, and claudication. Resp: Denies dyspnea at rest, cough, wheezing, coughing up blood, and pleurisy. GI: see HPI Derm: Denies rash, itching, dry skin Psych: Denies depression, anxiety, memory loss, confusion. No homicidal or suicidal ideation.  Heme: Denies bruising, bleeding, and enlarged lymph nodes.  Physical Exam: BP (!) 142/86   Pulse 92   Temp (!) 97.3 F (36.3 C) (Oral)   Ht '5\' 1"'  (1.549 m)   Wt 152 lb 13.6 oz (69.3 kg)   BMI 28.88 kg/m  General:   Alert  and oriented. No distress noted. Pleasant and cooperative.  Head:  Normocephalic and atraumatic. Eyes:  Conjuctiva clear without scleral icterus. Mouth:  Mask in place Abdomen:  +BS, soft, non-tender and non-distended. No rebound or guarding. No HSM or masses noted. Msk:  Symmetrical without gross deformities. Normal posture. Extremities:  Without edema. Neurologic:  Alert and  oriented x4 Psych:  Alert and cooperative. Normal mood and affect.  ASSESSMENT: Lamees Gable is a 64 y.o. 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   PBC: DEXA will be completed in Jan 2022. Taking calcium and Vit D. Requesting outside labs from PCP.   Chronic diarrhea: improvement noted with Xifaxan.   GERD: doing well on Aciphex BID. Mild nausea recently but now resolved. No alarm signs/symptoms.  Weight loss noted but now with plateau: multifactorial in setting of purposeful changes and decreasing amount. Call if any persistent weight loss.     PLAN:  Retrieve outside labs  Continue Aciphex BID  DEXA in Jan 2022  Return in 6 months  Annitta Needs, PhD, ANP-BC [facility]   ADDENDUM Sept 2021 labs received: BUN 13, Creatinine 0.92, Tbili 0.3, alk phos 82, AST 6, ALT 9,

## 2019-11-01 ENCOUNTER — Other Ambulatory Visit: Payer: Self-pay | Admitting: Gastroenterology

## 2019-11-01 DIAGNOSIS — G894 Chronic pain syndrome: Secondary | ICD-10-CM | POA: Diagnosis not present

## 2019-11-01 DIAGNOSIS — F4542 Pain disorder with related psychological factors: Secondary | ICD-10-CM | POA: Diagnosis not present

## 2019-11-01 DIAGNOSIS — M5030 Other cervical disc degeneration, unspecified cervical region: Secondary | ICD-10-CM | POA: Diagnosis not present

## 2019-11-01 DIAGNOSIS — G64 Other disorders of peripheral nervous system: Secondary | ICD-10-CM | POA: Diagnosis not present

## 2019-11-19 DIAGNOSIS — Z23 Encounter for immunization: Secondary | ICD-10-CM | POA: Diagnosis not present

## 2019-11-19 DIAGNOSIS — I10 Essential (primary) hypertension: Secondary | ICD-10-CM | POA: Diagnosis not present

## 2019-11-19 DIAGNOSIS — G894 Chronic pain syndrome: Secondary | ICD-10-CM | POA: Diagnosis not present

## 2019-11-19 DIAGNOSIS — F419 Anxiety disorder, unspecified: Secondary | ICD-10-CM | POA: Diagnosis not present

## 2019-11-19 DIAGNOSIS — Z6826 Body mass index (BMI) 26.0-26.9, adult: Secondary | ICD-10-CM | POA: Diagnosis not present

## 2019-11-19 DIAGNOSIS — K219 Gastro-esophageal reflux disease without esophagitis: Secondary | ICD-10-CM | POA: Diagnosis not present

## 2019-11-21 ENCOUNTER — Other Ambulatory Visit: Payer: Self-pay | Admitting: Gastroenterology

## 2019-12-01 DIAGNOSIS — F4542 Pain disorder with related psychological factors: Secondary | ICD-10-CM | POA: Diagnosis not present

## 2019-12-01 DIAGNOSIS — G894 Chronic pain syndrome: Secondary | ICD-10-CM | POA: Diagnosis not present

## 2019-12-01 DIAGNOSIS — G64 Other disorders of peripheral nervous system: Secondary | ICD-10-CM | POA: Diagnosis not present

## 2019-12-01 DIAGNOSIS — M5030 Other cervical disc degeneration, unspecified cervical region: Secondary | ICD-10-CM | POA: Diagnosis not present

## 2019-12-20 DIAGNOSIS — R600 Localized edema: Secondary | ICD-10-CM | POA: Diagnosis not present

## 2019-12-20 DIAGNOSIS — F419 Anxiety disorder, unspecified: Secondary | ICD-10-CM | POA: Diagnosis not present

## 2019-12-20 DIAGNOSIS — G894 Chronic pain syndrome: Secondary | ICD-10-CM | POA: Diagnosis not present

## 2019-12-20 DIAGNOSIS — I872 Venous insufficiency (chronic) (peripheral): Secondary | ICD-10-CM | POA: Diagnosis not present

## 2019-12-20 DIAGNOSIS — K219 Gastro-esophageal reflux disease without esophagitis: Secondary | ICD-10-CM | POA: Diagnosis not present

## 2019-12-20 DIAGNOSIS — Z6827 Body mass index (BMI) 27.0-27.9, adult: Secondary | ICD-10-CM | POA: Diagnosis not present

## 2020-01-01 DIAGNOSIS — Z1231 Encounter for screening mammogram for malignant neoplasm of breast: Secondary | ICD-10-CM | POA: Diagnosis not present

## 2020-01-14 DIAGNOSIS — H524 Presbyopia: Secondary | ICD-10-CM | POA: Diagnosis not present

## 2020-01-17 ENCOUNTER — Ambulatory Visit (HOSPITAL_COMMUNITY)
Admission: RE | Admit: 2020-01-17 | Discharge: 2020-01-17 | Disposition: A | Discharge status: Home / Self Care | Payer: BC Managed Care – PPO | Source: Ambulatory Visit | Attending: Physician Assistant | Admitting: Physician Assistant

## 2020-01-17 ENCOUNTER — Other Ambulatory Visit (HOSPITAL_COMMUNITY): Payer: Self-pay | Admitting: Physician Assistant

## 2020-01-17 ENCOUNTER — Other Ambulatory Visit: Payer: Self-pay

## 2020-01-17 DIAGNOSIS — R053 Chronic cough: Secondary | ICD-10-CM

## 2020-01-17 DIAGNOSIS — E663 Overweight: Secondary | ICD-10-CM | POA: Diagnosis not present

## 2020-01-17 DIAGNOSIS — M5030 Other cervical disc degeneration, unspecified cervical region: Secondary | ICD-10-CM | POA: Diagnosis not present

## 2020-01-17 DIAGNOSIS — Z6825 Body mass index (BMI) 25.0-25.9, adult: Secondary | ICD-10-CM | POA: Diagnosis not present

## 2020-01-17 DIAGNOSIS — R634 Abnormal weight loss: Secondary | ICD-10-CM | POA: Diagnosis not present

## 2020-01-17 DIAGNOSIS — G64 Other disorders of peripheral nervous system: Secondary | ICD-10-CM | POA: Diagnosis not present

## 2020-01-17 DIAGNOSIS — K219 Gastro-esophageal reflux disease without esophagitis: Secondary | ICD-10-CM | POA: Diagnosis not present

## 2020-01-17 DIAGNOSIS — G894 Chronic pain syndrome: Secondary | ICD-10-CM | POA: Diagnosis not present

## 2020-02-14 DIAGNOSIS — E2839 Other primary ovarian failure: Secondary | ICD-10-CM | POA: Diagnosis not present

## 2020-02-14 DIAGNOSIS — M8588 Other specified disorders of bone density and structure, other site: Secondary | ICD-10-CM | POA: Diagnosis not present

## 2020-02-18 DIAGNOSIS — K219 Gastro-esophageal reflux disease without esophagitis: Secondary | ICD-10-CM | POA: Diagnosis not present

## 2020-02-18 DIAGNOSIS — I10 Essential (primary) hypertension: Secondary | ICD-10-CM | POA: Diagnosis not present

## 2020-02-18 DIAGNOSIS — G894 Chronic pain syndrome: Secondary | ICD-10-CM | POA: Diagnosis not present

## 2020-02-18 DIAGNOSIS — R3129 Other microscopic hematuria: Secondary | ICD-10-CM | POA: Diagnosis not present

## 2020-02-25 DIAGNOSIS — I6782 Cerebral ischemia: Secondary | ICD-10-CM | POA: Diagnosis not present

## 2020-02-25 DIAGNOSIS — G930 Cerebral cysts: Secondary | ICD-10-CM | POA: Diagnosis not present

## 2020-02-25 DIAGNOSIS — E236 Other disorders of pituitary gland: Secondary | ICD-10-CM | POA: Diagnosis not present

## 2020-02-25 DIAGNOSIS — K862 Cyst of pancreas: Secondary | ICD-10-CM | POA: Diagnosis not present

## 2020-02-25 DIAGNOSIS — R930 Abnormal findings on diagnostic imaging of skull and head, not elsewhere classified: Secondary | ICD-10-CM | POA: Diagnosis not present

## 2020-02-29 ENCOUNTER — Ambulatory Visit (INDEPENDENT_AMBULATORY_CARE_PROVIDER_SITE_OTHER): Payer: BC Managed Care – PPO | Admitting: Internal Medicine

## 2020-02-29 ENCOUNTER — Other Ambulatory Visit: Payer: Self-pay

## 2020-02-29 ENCOUNTER — Encounter: Payer: Self-pay | Admitting: Internal Medicine

## 2020-02-29 DIAGNOSIS — R058 Other specified cough: Secondary | ICD-10-CM | POA: Diagnosis not present

## 2020-02-29 DIAGNOSIS — J9611 Chronic respiratory failure with hypoxia: Secondary | ICD-10-CM

## 2020-02-29 MED ORDER — METHYLPREDNISOLONE ACETATE 80 MG/ML IJ SUSP
120.0000 mg | Freq: Once | INTRAMUSCULAR | Status: AC
  Administered 2020-02-29: 120 mg via INTRAMUSCULAR

## 2020-02-29 MED ORDER — GABAPENTIN 100 MG PO CAPS: 100.0000 mg | ORAL_CAPSULE | Freq: Three times a day (TID) | ORAL | 2 refills | Status: DC

## 2020-02-29 NOTE — Assessment & Plan Note (Signed)
Dx 2017 Sleep study 07/18/15 desat x 50 min <89% but was sedated  ono  08/13/15 :  desat < 89% x 7 m so no 02 rec   - PFT's  03/19/16   FEV1 2.35 (102 % ) ratio 0.87  p 8 % improvement from saba p 0 prior to study with DLCO  16.59 (79%) corrects to 4.45 (99%)  for alv volume and FV curve nl  With ERV  38%  at wt 169  -  02/29/2020   Walked RA  approx   600 ft  @ fast pace  stopped due to  End of study , no sob with sats still 97%      No evidence of significant ild or copd but not she is active smoker so at risk (see separate a/p)   Consider hrct on f/u if still coughing/ o/w could enter LDSCT program if willing

## 2020-02-29 NOTE — Assessment & Plan Note (Signed)
Onset was while on ACEi and then recurred Nov 2020  -  Allergy profile 08/07/2015 >  Eos 0.1/  IgE  19 neg RAST - max gerd rx plus gabapentin 100 tid titrate to 300 mg tid   Upper airway cough syndrome (previously labeled PNDS),  is so named because it's frequently impossible to sort out how much is  CR/sinusitis with freq throat clearing (which can be related to primary GERD)   vs  causing  secondary (" extra esophageal")  GERD from wide swings in gastric pressure that occur with throat clearing, often  promoting self use of mint and menthol lozenges that reduce the lower esophageal sphincter tone and exacerbate the problem further in a cyclical fashion.   These are the same pts (now being labeled as having "irritable larynx syndrome" by some cough centers) who not infrequently have a history of having failed to tolerate ace inhibitors,  dry powder inhalers or biphosphonates or report having atypical/extraesophageal reflux symptoms that don't respond to standard doses of PPI  and are easily confused as having aecopd or asthma flares by even experienced allergists/ pulmonologists (myself included).   In fact, Of the three most common causes of  Sub-acute / recurrent or chronic cough, only one (GERD)  can actually contribute to/ trigger  the other two (asthma and post nasal drip syndrome)  and perpetuate the cylce of cough.  While not intuitively obvious, many patients with chronic low grade reflux do not cough until there is a primary insult that disturbs the protective epithelial barrier and exposes sensitive nerve endings.   This is typically viral but can due to PNDS and  either may apply here.   The point is that once this occurs, it is difficult to eliminate the cycle  using anything but a maximally effective acid suppression regimen at least in the short run, accompanied by an appropriate diet to address non acid GERD and control / eliminate the cough itself with gabapentin and 1st gen H1 blockers per  guidelines  And taper off tussionex if possible    Advised: The standardized cough guidelines published in Chest by Lissa Morales in 2006 are still the best available and consist of a multiple step process (up to 12!) , not a single office visit,  and are intended  to address this problem logically,  with an alogrithm dependent on response to empiric treatment at  each progressive step  to determine a specific diagnosis with  minimal addtional testing needed. Therefore if adherence is an issue or can't be accurately verified,  it's very unlikely the standard evaluation and treatment will be successful here.    Furthermore, response to therapy (other than acute cough suppression, which should only be used short term with avoidance of narcotic containing cough syrups if possible), can be a gradual process for which the patient is not likely to  perceive immediate benefit.  Unlike going to an eye doctor where the best perscription is almost always the first one and is immediately effective, this is almost never the case in the management of chronic cough syndromes. Therefore the patient needs to commit up front to consistently adhere to recommendations  for up to 6 weeks of therapy directed at the likely underlying problem(s) before the response can be reasonably evaluated.

## 2020-02-29 NOTE — Patient Instructions (Addendum)
GERD (REFLUX)  is an extremely common cause of respiratory symptoms just like yours , many times with no obvious heartburn at all.    It can be treated with medication, but also with lifestyle changes including elevation of the head of your bed (ideally with 6 -8inch blocks under the headboard of your bed),  Smoking cessation, avoidance of late meals, excessive alcohol, and avoid fatty foods, chocolate, peppermint, colas, red wine, and acidic juices such as orange juice.  NO MINT OR MENTHOL PRODUCTS SO NO COUGH DROPS  USE SUGARLESS CANDY INSTEAD (Jolley ranchers or Stover's or Life Savers) or even ice chips will also do - the key is to swallow to prevent all throat clearing. NO OIL BASED VITAMINS - use powdered substitutes.  Avoid fish oil when coughing.  Depomedrol 120 mg IM   Gabapentin dose is 100 mg three times a day   Aciphex Take 30- 60 min before your first and last meals of the day   For drainage / throat tickle try take CHLORPHENIRAMINE  4 mg  (Chlortab 4mg   at [employer] should be easiest to find in the green box)  take one every 4 hours as needed - available over the counter- may cause drowsiness so start with just a bedtime dose or two and see how you tolerate it before trying in daytime      Please schedule a follow up office visit in 6 weeks, call sooner if needed

## 2020-02-29 NOTE — Progress Notes (Signed)
Subjective:    Patient ID: Laura [name], female    DOB: [birth year] .   MRN: [id number]    Brief patient profile:  33 yowf active smoker with remote h/o acei cough but this had resolved off acei and while still on cigs with onset of new cough early June 2017 referred to pulmonary clinic 08/07/2015 by Dr Frances Furbish with this complaint and also desats on Sleep study 07/18/15     08/07/2015 1st [name] visit/ Laura [name]   Chief Complaint  Patient presents with  . Pulmonary Consult    Referred by Dr. Huston Foley for nocturnal hypoxia. Pt c/o cough x 3 wks and SOB "for a while" "I just run out of air".   had been seeing neuro for neuropathy and underwent sleep study with h/o snoring and desaturated during the sleep study done at [facility] sleep  07/18/15   and neg sleep apnea/ pos for hypoxemia x 51 min. Cough was not present at GI ov 07/07/15 while protonix  40 mg bid but after change to aciphex bid for diarrhea so back to protonix but not ac x one week s benefit Cough is worse after  30 min - 1 hours at hs Coughs so hard vomits and loses her breath Takes clariton for pnds x forever s benefit for sneezing Really mostly sob when coughing, not really limited from desired activities  Re Please see patient coordinator before you leave today  to schedule for Overnight 02 sats on RA For cough try tessalon 200 mg every  4 hours as needed Stop clariton and For drainage / throat tickle try take CHLORPHENIRAMINE  4 mg - take one every 4 hours as needed  Protonix Take 30- 60 min before your first and last meals of the day  GERD diet   02/29/2020  Re-establish ov/Laura [name]/Laura [name] re: consultation Mann PA / on ppi but not ac  Chief Complaint  Patient presents with  . Consult    Non productive cough since last November, feels like phlegm in throat only in the mornings  Dyspnea:  Not limited by breathing from desired activities   Cough: sporadic ? Worse after few hours hs and sense of pnds/ worse  with voice and swallowing   Sleeping: fine p hydrocodone only on side bed flat SABA use: none  02: none  Globus sensation, had trouble taking gabapentin previously at > 900 mg daily    No obvious day to day or daytime variability or assoc excess/ purulent sputum or mucus plugs or hemoptysis or cp or chest tightness, subjective wheeze or overt sinus or hb symptoms.   Sleeping p tussionex  As above  without nocturnal  or early am exacerbation  of respiratory  c/o's or need for noct saba. Also denies any obvious fluctuation of symptoms with weather or environmental changes or other aggravating or alleviating factors except as outlined above   No unusual exposure hx or h/o childhood pna/ asthma or knowledge of premature birth.  Current Allergies, Complete Past Medical History, Past Surgical History, Family History, and Social History were reviewed in [employer] record.  ROS  The following are not active complaints unless bolded Hoarseness, sore throat, dysphagia, dental problems, itching, sneezing,  nasal congestion or discharge of excess mucus or purulent secretions, ear ache,   fever, chills, sweats, unintended wt loss or wt gain, classically pleuritic or exertional cp,  orthopnea pnd or arm/hand swelling  or leg swelling, presyncope, palpitations, abdominal pain, anorexia, nausea, vomiting,  diarrhea  or change in bowel habits or change in bladder habits, change in stools or change in urine, dysuria, hematuria,  rash, arthralgias, visual complaints, headache, numbness, weakness or ataxia or problems with walking or coordination,  change in mood or  memory.        Current Meds  Medication Sig  . ALPRAZolam (XANAX) 0.5 MG tablet Take 0.5 mg by mouth as needed.  Marland Kitchen ascorbic acid (VITAMIN C) 500 MG tablet Take 500 mg by mouth 2 (two) times daily.  . chlorpheniramine-HYDROcodone (TUSSIONEX) 10-8 MG/5ML SUER SMARTSIG:1 Teaspoon By Mouth Every 12 Hours  . cholecalciferol  (VITAMIN D3) 25 MCG (1000 UNIT) tablet Take 1,000 Units by mouth daily.  . hydrochlorothiazide (MICROZIDE) 12.5 MG capsule Take 12.5 mg by mouth every morning.   Marland Kitchen HYDROcodone-acetaminophen (NORCO) 10-325 MG tablet Take 1 tablet by mouth every 6 (six) hours as needed.  . Omega-3 Fatty Acids (FISH OIL PO) Take by mouth daily.   Marland Kitchen PARoxetine (PAXIL) 20 MG tablet Take 10 mg by mouth daily.  . RABEprazole (ACIPHEX) 20 MG tablet TAKE 1 TABLET BY MOUTH TWICE DAILY  . rOPINIRole (REQUIP) 0.5 MG tablet Take 0.5 mg by mouth at bedtime.  . traZODone (DESYREL) 150 MG tablet Take 150 mg by mouth at bedtime.  . ursodiol (ACTIGALL) 500 MG tablet TAKE 1 TABLET BY MOUTH TWICE DAILY                   Objective:    Wt Readings from Last 3 Encounters:  02/29/20 142 lb 3.2 oz (64.5 kg)  10/17/19 152 lb 13.6 oz (69.3 kg)  04/10/19 166 lb 6.4 oz (75.5 kg)      Vital signs reviewed  02/29/2020  - Note at rest 02 sats  98% on RA   General appearance:   amb mildly obese wf nad    HEENT : pt wearing mask not removed for exam due to covid -19 concerns.    NECK :  without JVD/Nodes/TM/ nl carotid upstrokes bilaterally   LUNGS: no acc muscle use,  Nl contour chest which is clear to A and P bilaterally without cough on insp or exp maneuvers   CV:  RRR  no s3 or murmur or increase in P2, and no edema   ABD:  soft and nontender with nl inspiratory excursion in the supine position. No bruits or organomegaly appreciated, bowel sounds nl  MS:  Nl gait/ ext warm without deformities, calf tenderness, cyanosis or clubbing No obvious joint restrictions   SKIN: warm and dry without lesions    NEURO:  alert, approp, nl sensorium with  no motor or cerebellar deficits apparent.           MRI sinus 02/03/17  Neg    I personally reviewed images and agree with radiology impression as follows:  CXR: No focal consolidation. Diffuse interstitial prominence, unchanged and likely chronic sequela.          Assessment & Plan:

## 2020-03-03 DIAGNOSIS — I10 Essential (primary) hypertension: Secondary | ICD-10-CM | POA: Diagnosis not present

## 2020-03-03 DIAGNOSIS — K219 Gastro-esophageal reflux disease without esophagitis: Secondary | ICD-10-CM | POA: Diagnosis not present

## 2020-03-03 DIAGNOSIS — E782 Mixed hyperlipidemia: Secondary | ICD-10-CM | POA: Diagnosis not present

## 2020-03-14 DIAGNOSIS — K529 Noninfective gastroenteritis and colitis, unspecified: Secondary | ICD-10-CM | POA: Diagnosis not present

## 2020-03-14 DIAGNOSIS — E663 Overweight: Secondary | ICD-10-CM | POA: Diagnosis not present

## 2020-03-14 DIAGNOSIS — Z1389 Encounter for screening for other disorder: Secondary | ICD-10-CM | POA: Diagnosis not present

## 2020-03-14 DIAGNOSIS — F419 Anxiety disorder, unspecified: Secondary | ICD-10-CM | POA: Diagnosis not present

## 2020-03-14 DIAGNOSIS — I10 Essential (primary) hypertension: Secondary | ICD-10-CM | POA: Diagnosis not present

## 2020-03-14 DIAGNOSIS — E7849 Other hyperlipidemia: Secondary | ICD-10-CM | POA: Diagnosis not present

## 2020-03-14 DIAGNOSIS — Z0001 Encounter for general adult medical examination with abnormal findings: Secondary | ICD-10-CM | POA: Diagnosis not present

## 2020-03-14 DIAGNOSIS — R519 Headache, unspecified: Secondary | ICD-10-CM | POA: Diagnosis not present

## 2020-03-14 DIAGNOSIS — G4700 Insomnia, unspecified: Secondary | ICD-10-CM | POA: Diagnosis not present

## 2020-03-14 DIAGNOSIS — Z6825 Body mass index (BMI) 25.0-25.9, adult: Secondary | ICD-10-CM | POA: Diagnosis not present

## 2020-03-14 DIAGNOSIS — Z1331 Encounter for screening for depression: Secondary | ICD-10-CM | POA: Diagnosis not present

## 2020-03-14 DIAGNOSIS — G894 Chronic pain syndrome: Secondary | ICD-10-CM | POA: Diagnosis not present

## 2020-04-08 ENCOUNTER — Ambulatory Visit: Payer: BC Managed Care – PPO | Admitting: Internal Medicine

## 2020-04-15 ENCOUNTER — Ambulatory Visit: Payer: BC Managed Care – PPO | Admitting: Gastroenterology

## 2020-04-17 DIAGNOSIS — J329 Chronic sinusitis, unspecified: Secondary | ICD-10-CM | POA: Diagnosis not present

## 2020-04-17 DIAGNOSIS — H6991 Unspecified Eustachian tube disorder, right ear: Secondary | ICD-10-CM | POA: Diagnosis not present

## 2020-04-17 DIAGNOSIS — Z6826 Body mass index (BMI) 26.0-26.9, adult: Secondary | ICD-10-CM | POA: Diagnosis not present

## 2020-04-17 DIAGNOSIS — K219 Gastro-esophageal reflux disease without esophagitis: Secondary | ICD-10-CM | POA: Diagnosis not present

## 2020-04-17 DIAGNOSIS — G894 Chronic pain syndrome: Secondary | ICD-10-CM | POA: Diagnosis not present

## 2020-04-24 ENCOUNTER — Ambulatory Visit: Payer: BC Managed Care – PPO | Admitting: Internal Medicine

## 2020-04-29 ENCOUNTER — Other Ambulatory Visit: Payer: Self-pay | Admitting: Gastroenterology

## 2020-04-30 DIAGNOSIS — I10 Essential (primary) hypertension: Secondary | ICD-10-CM | POA: Diagnosis not present

## 2020-04-30 DIAGNOSIS — K219 Gastro-esophageal reflux disease without esophagitis: Secondary | ICD-10-CM | POA: Diagnosis not present

## 2020-04-30 DIAGNOSIS — E782 Mixed hyperlipidemia: Secondary | ICD-10-CM | POA: Diagnosis not present

## 2020-05-15 DIAGNOSIS — I10 Essential (primary) hypertension: Secondary | ICD-10-CM | POA: Diagnosis not present

## 2020-05-15 DIAGNOSIS — G894 Chronic pain syndrome: Secondary | ICD-10-CM | POA: Diagnosis not present

## 2020-05-15 DIAGNOSIS — Z6826 Body mass index (BMI) 26.0-26.9, adult: Secondary | ICD-10-CM | POA: Diagnosis not present

## 2020-05-15 DIAGNOSIS — F419 Anxiety disorder, unspecified: Secondary | ICD-10-CM | POA: Diagnosis not present

## 2020-05-28 ENCOUNTER — Telehealth: Payer: Self-pay

## 2020-05-28 ENCOUNTER — Ambulatory Visit (INDEPENDENT_AMBULATORY_CARE_PROVIDER_SITE_OTHER): Payer: BC Managed Care – PPO | Admitting: Gastroenterology

## 2020-05-28 ENCOUNTER — Encounter: Payer: Self-pay | Admitting: Gastroenterology

## 2020-05-28 ENCOUNTER — Other Ambulatory Visit: Payer: Self-pay

## 2020-05-28 VITALS — BP 146/82 | HR 88 | Temp 97.1°F | Ht 62.0 in | Wt 145.2 lb

## 2020-05-28 DIAGNOSIS — R634 Abnormal weight loss: Secondary | ICD-10-CM | POA: Diagnosis not present

## 2020-05-28 DIAGNOSIS — K625 Hemorrhage of anus and rectum: Secondary | ICD-10-CM | POA: Insufficient documentation

## 2020-05-28 DIAGNOSIS — R1310 Dysphagia, unspecified: Secondary | ICD-10-CM

## 2020-05-28 DIAGNOSIS — K743 Primary biliary cirrhosis: Secondary | ICD-10-CM

## 2020-05-28 NOTE — Patient Instructions (Signed)
We are arranging a colonoscopy, upper endoscopy, and dilation if needed by Dr. Gala Romney.  I recommend starting Zenpep, taking 2 capsules with meals and 1 with snacks, no more than 8 per day. We have provided samples. Let me know how this works for you, and I can send in a prescription.  I am requesting labs from your primary care. We may need to do a CT scan or order more labs; further recommendations to follow!  I enjoyed seeing you again today! As you know, I value our relationship and want to provide genuine, compassionate, and quality care. I welcome your feedback. If you receive a survey regarding your visit,  I greatly appreciate you taking time to fill this out. See you next time!  Annitta Needs, PhD, ANP-BC [facility]

## 2020-05-28 NOTE — Progress Notes (Signed)
Referring Provider: Redmond School, MD Primary Care Physician:  Redmond School, MD  Chief Complaint  Patient presents with  . Diarrhea    Can go 2 days without it but then will have several episodes. Had severe cramps yesterday. Nausea yesterday.    HPI:   Laura [name] is a 65 y.o. female presenting today with a history of chronic diarrhea, previously evaluated extensively and worked up for microscopic colitis and celiac disease. Ileocolonoscopy in July 2017 negative for microscopiccolitis. Felt to have IBS-D. Hep B core antibody positive in past but felt to be a false positive. 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 Started on Urso and elastography with minimal fibrosis.Recommended 1000 mg calcium and 1000 iu Vit D daily. Needs DEXA. Holding on liver biopsy unless worsening of LFTs +/- abnormal transaminases. Outside labs dated Feb 2021: alk phos 95, transaminases normal, no anemia on CBC. LFTS Sept 2021 Tbili 0.3, alk phos 82, AST 6, ALT 9,    Diarrhea: has taken course of Xifaxan in the past. Bentyl and Imodium without much improvement. Not taking Vit D currently.   Notes abdominal cramping yesterday. Two months ago was impacted. Has lost down to 138 unintentionally and today 145. A year ago she was in the 160s/170s. Eats breakfast and supper. Not much appetite. Intermittent solid food dysphagia.  One episode of bright red blood after she was impacted. Will skip days of BM. Has sludge, muddy stool. However, most of time, stool will be watery and greasy appearing. Has had intermittent coughing chronically. PCP aware. CXR on file Dec 2021.   Last EGD in 2017 with esophagitis and gastric polyps. Last colonoscopy in 2017.   Past Medical History:  Diagnosis Date  . Acromioclavicular joint arthritis    left shoulder  . Anxiety   . 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 GERD (gastroesophageal reflux disease)    . H/O hiatal hernia   . Hepatitis B antibody positive    letter from red cross, Hep B core total ab positive  . History of melanoma excision    01-05-2014  . Hypertension   . IBS (irritable bowel syndrome)   . Nocturnal hypoxia   . OA (osteoarthritis) of knee    RIGHT  . RLS (restless legs syndrome)   . Rotator cuff impingement syndrome of left shoulder   . Shortness of breath dyspnea   . Unspecified hereditary and idiopathic peripheral neuropathy     Past Surgical History:  Procedure Laterality Date  . ABDOMINAL HYSTERECTOMY    . ANTERIOR CERVICAL DECOMP/DISCECTOMY FUSION  2000   C4 -- C5  . BIOPSY  08/11/2015   Procedure: BIOPSY;  Surgeon: Daneil Dolin, MD;  [facility];  Service: Endoscopy;;  gastric polyp;  . cardiac cath     unremarkable  . CARPAL TUNNEL RELEASE Right 1995  . CHOLECYSTECTOMY  1996  . COLONOSCOPY  07/2007   Dr. Gala Romney: Terminal ileum normal. Status post segmental colonic mucosal biopsy.  . COLONOSCOPY WITH PROPOFOL N/A 08/11/2015   normal colonoscopy with benign colonic mucosa  . ESOPHAGOGASTRODUODENOSCOPY  07/2007   Dr. Gala Romney: Small hiatal hernia, duodenal biopsy unremarkable. Fundic gland gastric polyps.  . ESOPHAGOGASTRODUODENOSCOPY (EGD) WITH PROPOFOL N/A 08/11/2015   LA Grade A esophagitis, small hiatal hernia, few gastric polyps, normal duodenum (benign fundic gland polyp)  . KNEE ARTHROSCOPY WITH MEDIAL MENISECTOMY Right 05/25/2013  Procedure: RIGHT KNEE ARTHROSCOPY WITH PARTIAL LATERAL and MEDIAL MENISECTOMY AND CHONDROPLASTY;  Surgeon: Sydnee Cabal, MD;  Location: [facility];  Service: Orthopedics;  Laterality: Right;  . LUMBAR DISC SURGERY  08-22-2003   LEFT  L5  --  S1  . MOHS SURGERY  01-05-2014   LEFT SIDE OF FACE  . SHOULDER ARTHROSCOPY WITH SUBACROMIAL DECOMPRESSION Left 01/29/2014   Procedure: LEFT SHOULDER ARTHROSCOPY WITH SUBACROMIAL DECOMPRESSION/DISTAL CLAVICLE RESECTION AND DEBRIDEMENT;  Surgeon: Sydnee Cabal, MD;  Location: [facility];  Service: Orthopedics;  Laterality: Left;  . TOTAL ABDOMINAL HYSTERECTOMY W/ BILATERAL SALPINGOOPHORECTOMY  1999    Current Outpatient Medications  Medication Sig Dispense Refill  . ALPRAZolam (XANAX) 0.5 MG tablet Take 0.5 mg by mouth as needed.    Marland Kitchen ascorbic acid (VITAMIN C) 500 MG tablet Take 500 mg by mouth 2 (two) times daily.    Marland Kitchen gabapentin (NEURONTIN) 100 MG capsule Take 1 capsule (100 mg total) by mouth 3 (three) times daily. One three times daily (Patient taking differently: Take 100 mg by mouth 3 (three) times daily. Prescribed 3 times a day but usually takes 3 times a week) 90 capsule 2  . hydrochlorothiazide (MICROZIDE) 12.5 MG capsule Take 12.5 mg by mouth every morning.     Marland Kitchen HYDROcodone-acetaminophen (NORCO) 10-325 MG tablet Take 1 tablet by mouth every 6 (six) hours as needed.    . Omega-3 Fatty Acids (FISH OIL PO) Take by mouth daily.     Marland Kitchen PARoxetine (PAXIL) 20 MG tablet Take 10 mg by mouth daily.    . RABEprazole (ACIPHEX) 20 MG tablet TAKE 1 TABLET BY MOUTH TWICE DAILY 60 tablet 5  . rOPINIRole (REQUIP) 0.5 MG tablet Take 0.5 mg by mouth at bedtime.    . traZODone (DESYREL) 150 MG tablet Take 150 mg by mouth at bedtime.    . ursodiol (ACTIGALL) 500 MG tablet TAKE 1 TABLET BY MOUTH TWICE DAILY 60 tablet 5   No current facility-administered medications for this visit.    Allergies as of 05/28/2020 - Review Complete 05/28/2020  Allergen Reaction Noted  . Aspirin Other (See Comments) 06/16/2015  . Celebrex [celecoxib] Hives 07/27/2012    Family History  Problem Relation Age of Onset  . Lung cancer Mother   . HIV/AIDS Father   . Crohn's disease Cousin   . Breast cancer Maternal Aunt   . Sjogren's syndrome Sister   . Diabetes Brother   . Heart attack Daughter   . Drug abuse Son   . ADD / ADHD Grandchild   . Colon cancer Neg Hx   . Colon polyps Neg Hx     Social History   Socioeconomic History  . Marital  status: Married    Spouse name: [name]  . Number of children: 3  . Years of education: GED  . Highest education level: Not on file  Occupational History    Comment: not employed  Tobacco Use  . Smoking status: Current Every Day Smoker    Packs/day: 1.00    Years: 20.00    Pack years: 20.00    Types: Cigarettes  . Smokeless tobacco: Never Used  . Tobacco comment: smokes 3 cigarettes per day-- 02/29/2020  Vaping Use  . Vaping Use: Never used  Substance and Sexual Activity  . Alcohol use: No    Alcohol/week: 0.0 standard drinks  . Drug use: No  . Sexual activity: Not on file  Other Topics Concern  . Not on file  Social  History Narrative   Patient lives at home with her husband [name]).    Patient is not working as time but she trying for disability.   Education 10th grade   Caffeine daily coffee and mountain dew . One can daily.   .      Social Determinants of Health   Financial Resource Strain: Not on file  Food Insecurity: Not on file  Transportation Needs: Not on file  Physical Activity: Not on file  Stress: Not on file  Social Connections: Not on file    Review of Systems: Gen: Denies fever, chills, anorexia. Denies fatigue, weakness, weight loss.  CV: Denies chest pain, palpitations, syncope, peripheral edema, and claudication. Resp: Denies dyspnea at rest, cough, wheezing, coughing up blood, and pleurisy. GI: see HPI Derm: Denies rash, itching, dry skin Psych: Denies depression, anxiety, memory loss, confusion. No homicidal or suicidal ideation.  Heme: Denies bruising, bleeding, and enlarged lymph nodes.  Physical Exam: BP (!) 146/82   Pulse 88   Temp (!) 97.1 F (36.2 C)   Ht '5\' 2"'  (1.575 m)   Wt 145 lb 3.2 oz (65.9 kg)   BMI 26.56 kg/m  General:   Alert and oriented. No distress noted. Pleasant and cooperative.  Head:  Normocephalic and atraumatic. Eyes:  Conjuctiva clear without scleral icterus. Mouth:  Mask in place Abdomen:  +BS, soft,  non-tender and non-distended. No rebound or guarding. No HSM or masses noted. Msk:  Symmetrical without gross deformities. Normal posture. Extremities:  Without edema. Neurologic:  Alert and  oriented x4 Psych:  Alert and cooperative. Normal mood and affect.  ASSESSMENT: Laura [name] is a 65 y.o. female presenting today with a history of chronic diarrhea, previously evaluated extensively and worked up for microscopic colitis and celiac disease. Ileocolonoscopy in July 2017 negative for microscopiccolitis. Felt to have IBS-D at that time. 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 Started on Urso and elastography with minimal fibrosis. LFTS Sept 2021 Tbili 0.3, alk phos 82, AST 6, ALT 9. Now with unintentional weight loss, episode of rectal bleeding, and dysphagia.    Diarrhea: Bentyl and Imodium without much improvement. Xifaxan in the past. Notes "greasy" stool. Malabsorptive component likely. Will trial Zenpep samples. If no improvement, consider Colestid.   Rectal bleeding: in setting of straining one occurrence. However, with weight loss and last colonoscopy in 2017, will pursue diagnostic colonoscopy in near future.  Dysphagia: history of esophagitis. Last EGD in 2017. Will pursue EGD/dilation at time of colonoscopy. Continue Aciphex BID.  PBC: elevated AMA, history of alk phos. LFTs Sept 2021 normal with normalized alk phos. On Urso BID. Update [place] now and retrieve most recent labs. Recommend Vit D and D daily. Retrieve DEXA scan results that were recently completed.    PLAN:  Proceed with colonoscopy/EGD/dilation by Dr. Gala Romney in near future: the risks, benefits, and alternatives have been discussed with the patient in detail. The patient states understanding and desires to proceed.  Zenpep samples to trial, call with update  Continue Aciphex BID  Retrieve outside labs and DEXA  Update [place]  Further recommendations to follow   Annitta Needs, PhD, ANP-BC [facility]

## 2020-05-28 NOTE — Telephone Encounter (Signed)
Will call pt to schedule TCS/EGD/DIL w/Propofol ASA 2 with Dr. Gala Romney when his July schedule is available.

## 2020-05-29 ENCOUNTER — Other Ambulatory Visit: Payer: Self-pay | Admitting: Internal Medicine

## 2020-05-31 DIAGNOSIS — E7849 Other hyperlipidemia: Secondary | ICD-10-CM | POA: Diagnosis not present

## 2020-05-31 DIAGNOSIS — K219 Gastro-esophageal reflux disease without esophagitis: Secondary | ICD-10-CM | POA: Diagnosis not present

## 2020-05-31 DIAGNOSIS — I10 Essential (primary) hypertension: Secondary | ICD-10-CM | POA: Diagnosis not present

## 2020-06-03 ENCOUNTER — Telehealth: Payer: Self-pay | Admitting: Gastroenterology

## 2020-06-03 DIAGNOSIS — K743 Primary biliary cirrhosis: Secondary | ICD-10-CM

## 2020-06-03 NOTE — Telephone Encounter (Signed)
Laura [name]: can we get recent outside labs from PCP?   [facility] clinical pool: please order RUQ [place] due to history of PBC.

## 2020-06-03 NOTE — Addendum Note (Signed)
Addended by: Cheron Every on: 06/03/2020 09:18 AM   Modules accepted: Orders

## 2020-06-03 NOTE — Telephone Encounter (Signed)
Requested any recent or last labs from [facility]

## 2020-06-03 NOTE — Telephone Encounter (Signed)
RUQ [place] scheduled for 5/11 at 8:30am, arrival 8:15am, npo midnight.  Called pt and she is aware of appt details. She voiced understanding.

## 2020-06-11 ENCOUNTER — Ambulatory Visit (HOSPITAL_COMMUNITY)
Admission: RE | Admit: 2020-06-11 | Discharge: 2020-06-11 | Disposition: A | Discharge status: Home / Self Care | Payer: BC Managed Care – PPO | Source: Ambulatory Visit | Attending: Gastroenterology | Admitting: Gastroenterology

## 2020-06-11 DIAGNOSIS — K760 Fatty (change of) liver, not elsewhere classified: Secondary | ICD-10-CM | POA: Diagnosis not present

## 2020-06-11 DIAGNOSIS — K743 Primary biliary cirrhosis: Secondary | ICD-10-CM | POA: Insufficient documentation

## 2020-06-16 DIAGNOSIS — Z6826 Body mass index (BMI) 26.0-26.9, adult: Secondary | ICD-10-CM | POA: Diagnosis not present

## 2020-06-16 DIAGNOSIS — R053 Chronic cough: Secondary | ICD-10-CM | POA: Diagnosis not present

## 2020-06-16 DIAGNOSIS — E663 Overweight: Secondary | ICD-10-CM | POA: Diagnosis not present

## 2020-06-16 DIAGNOSIS — G894 Chronic pain syndrome: Secondary | ICD-10-CM | POA: Diagnosis not present

## 2020-06-16 DIAGNOSIS — I10 Essential (primary) hypertension: Secondary | ICD-10-CM | POA: Diagnosis not present

## 2020-06-16 DIAGNOSIS — M2550 Pain in unspecified joint: Secondary | ICD-10-CM | POA: Diagnosis not present

## 2020-06-16 DIAGNOSIS — J45909 Unspecified asthma, uncomplicated: Secondary | ICD-10-CM | POA: Diagnosis not present

## 2020-06-19 ENCOUNTER — Other Ambulatory Visit: Payer: Self-pay

## 2020-06-19 ENCOUNTER — Telehealth: Payer: Self-pay | Admitting: Internal Medicine

## 2020-06-19 DIAGNOSIS — K625 Hemorrhage of anus and rectum: Secondary | ICD-10-CM

## 2020-06-19 DIAGNOSIS — R634 Abnormal weight loss: Secondary | ICD-10-CM

## 2020-06-19 DIAGNOSIS — R1310 Dysphagia, unspecified: Secondary | ICD-10-CM

## 2020-06-19 MED ORDER — SUPREP BOWEL PREP KIT 17.5-3.13-1.6 GM/177ML PO SOLN: 1.0000 | ORAL | 0 refills | Status: DC

## 2020-06-19 NOTE — Telephone Encounter (Signed)
Pt said she was returning a call. [phone]

## 2020-06-19 NOTE — Telephone Encounter (Signed)
See other phone note for today (procedure scheduled).

## 2020-06-19 NOTE — Telephone Encounter (Signed)
Spoke to pt, TCS/EGD/DIL scheduled for 06/26/20 at 2:30pm. Rx for prep sent to pharmacy (she requested small volume prep). Orders entered. Pt has access to MyChart. Appt letter and procedure instructions sent to MyChart and mailed.  No PA needed for procedure per AIM website.

## 2020-06-19 NOTE — Telephone Encounter (Signed)
noted 

## 2020-06-19 NOTE — Telephone Encounter (Signed)
Tried to call pt to schedule TCS/EGD/DIL d/t cancellation next week, LMOVM for return call.

## 2020-06-20 NOTE — Telephone Encounter (Signed)
Pt called office, requested to reschedule procedure that's for 06/26/20. Manuela Schwartz informed her procedure would be cancelled and she will be rescheduled later when Dr. Roseanne Kaufman schedule is available. Endo scheduler informed.

## 2020-06-24 ENCOUNTER — Other Ambulatory Visit (HOSPITAL_COMMUNITY)
Admission: RE | Admit: 2020-06-24 | Discharge: 2020-06-24 | Disposition: A | Discharge status: Home / Self Care | Payer: BC Managed Care – PPO | Source: Ambulatory Visit | Attending: Internal Medicine | Admitting: Internal Medicine

## 2020-06-26 ENCOUNTER — Ambulatory Visit (HOSPITAL_COMMUNITY)
Admission: RE | Admit: 2020-06-26 | Payer: BC Managed Care – PPO | Source: Home / Self Care | Admitting: Internal Medicine

## 2020-06-26 ENCOUNTER — Encounter (HOSPITAL_COMMUNITY): Admission: RE | Payer: Self-pay | Source: Home / Self Care

## 2020-06-26 SURGERY — COLONOSCOPY WITH PROPOFOL
Anesthesia: Monitor Anesthesia Care

## 2020-07-01 DIAGNOSIS — I10 Essential (primary) hypertension: Secondary | ICD-10-CM | POA: Diagnosis not present

## 2020-07-01 DIAGNOSIS — K219 Gastro-esophageal reflux disease without esophagitis: Secondary | ICD-10-CM | POA: Diagnosis not present

## 2020-07-01 DIAGNOSIS — E782 Mixed hyperlipidemia: Secondary | ICD-10-CM | POA: Diagnosis not present

## 2020-07-21 DIAGNOSIS — F329 Major depressive disorder, single episode, unspecified: Secondary | ICD-10-CM | POA: Diagnosis not present

## 2020-07-21 DIAGNOSIS — Z6826 Body mass index (BMI) 26.0-26.9, adult: Secondary | ICD-10-CM | POA: Diagnosis not present

## 2020-07-21 DIAGNOSIS — M5030 Other cervical disc degeneration, unspecified cervical region: Secondary | ICD-10-CM | POA: Diagnosis not present

## 2020-07-21 DIAGNOSIS — E663 Overweight: Secondary | ICD-10-CM | POA: Diagnosis not present

## 2020-07-21 DIAGNOSIS — G894 Chronic pain syndrome: Secondary | ICD-10-CM | POA: Diagnosis not present

## 2020-07-21 DIAGNOSIS — G64 Other disorders of peripheral nervous system: Secondary | ICD-10-CM | POA: Diagnosis not present

## 2020-07-21 NOTE — Addendum Note (Signed)
Addended by: Cheron Every on: 07/21/2020 02:27 PM   Modules accepted: Orders

## 2020-07-21 NOTE — Telephone Encounter (Signed)
Called pt. She has been rescheduled to 8/29 at 8:15am. Aware will mail new instructions in the mail. She already has her prep kit at home.

## 2020-07-31 DIAGNOSIS — I10 Essential (primary) hypertension: Secondary | ICD-10-CM | POA: Diagnosis not present

## 2020-07-31 DIAGNOSIS — E782 Mixed hyperlipidemia: Secondary | ICD-10-CM | POA: Diagnosis not present

## 2020-07-31 DIAGNOSIS — K219 Gastro-esophageal reflux disease without esophagitis: Secondary | ICD-10-CM | POA: Diagnosis not present

## 2020-08-18 DIAGNOSIS — Z6826 Body mass index (BMI) 26.0-26.9, adult: Secondary | ICD-10-CM | POA: Diagnosis not present

## 2020-08-18 DIAGNOSIS — I10 Essential (primary) hypertension: Secondary | ICD-10-CM | POA: Diagnosis not present

## 2020-08-18 DIAGNOSIS — E663 Overweight: Secondary | ICD-10-CM | POA: Diagnosis not present

## 2020-08-18 DIAGNOSIS — F419 Anxiety disorder, unspecified: Secondary | ICD-10-CM | POA: Diagnosis not present

## 2020-08-18 DIAGNOSIS — G894 Chronic pain syndrome: Secondary | ICD-10-CM | POA: Diagnosis not present

## 2020-09-22 DIAGNOSIS — F419 Anxiety disorder, unspecified: Secondary | ICD-10-CM | POA: Diagnosis not present

## 2020-09-22 DIAGNOSIS — R5383 Other fatigue: Secondary | ICD-10-CM | POA: Diagnosis not present

## 2020-09-22 DIAGNOSIS — G894 Chronic pain syndrome: Secondary | ICD-10-CM | POA: Diagnosis not present

## 2020-09-22 DIAGNOSIS — I10 Essential (primary) hypertension: Secondary | ICD-10-CM | POA: Diagnosis not present

## 2020-09-22 DIAGNOSIS — E663 Overweight: Secondary | ICD-10-CM | POA: Diagnosis not present

## 2020-09-22 DIAGNOSIS — Z6826 Body mass index (BMI) 26.0-26.9, adult: Secondary | ICD-10-CM | POA: Diagnosis not present

## 2020-09-22 DIAGNOSIS — K219 Gastro-esophageal reflux disease without esophagitis: Secondary | ICD-10-CM | POA: Diagnosis not present

## 2020-09-23 DIAGNOSIS — R5383 Other fatigue: Secondary | ICD-10-CM | POA: Diagnosis not present

## 2020-09-24 ENCOUNTER — Telehealth: Payer: Self-pay | Admitting: *Deleted

## 2020-09-24 ENCOUNTER — Encounter: Payer: Self-pay | Admitting: Internal Medicine

## 2020-09-24 NOTE — Telephone Encounter (Signed)
Pt left VM she needs to cancel procedure with Dr. Gala Romney Monday 8/29. Called endo and made aware to cancel Called pt back and made aware will need OV to r/s. She voiced understanding. Stacey please schedule next available and mail letter. Thanks

## 2020-09-29 ENCOUNTER — Ambulatory Visit (HOSPITAL_COMMUNITY): Admission: RE | Admit: 2020-09-29 | Payer: PPO | Source: Home / Self Care | Admitting: Internal Medicine

## 2020-09-29 ENCOUNTER — Encounter (HOSPITAL_COMMUNITY): Admission: RE | Payer: Self-pay | Source: Home / Self Care

## 2020-09-29 SURGERY — COLONOSCOPY WITH PROPOFOL
Anesthesia: Monitor Anesthesia Care

## 2020-10-01 NOTE — Progress Notes (Signed)
Labs from Feb 2022 reviewed:  Hgb 15.4, Hct 45.1, Platelets 347. BUN 19, Creatinine 0.94, Tbili 0.4, Alk Phos 102, AST 11, ALT 7. TSH 1.1.

## 2020-10-20 DIAGNOSIS — I10 Essential (primary) hypertension: Secondary | ICD-10-CM | POA: Diagnosis not present

## 2020-10-20 DIAGNOSIS — G894 Chronic pain syndrome: Secondary | ICD-10-CM | POA: Diagnosis not present

## 2020-10-30 ENCOUNTER — Other Ambulatory Visit: Payer: Self-pay | Admitting: Gastroenterology

## 2020-11-05 ENCOUNTER — Other Ambulatory Visit (HOSPITAL_BASED_OUTPATIENT_CLINIC_OR_DEPARTMENT_OTHER): Payer: Self-pay

## 2020-11-05 ENCOUNTER — Ambulatory Visit: Payer: BC Managed Care – PPO | Attending: Internal Medicine

## 2020-11-05 DIAGNOSIS — Z23 Encounter for immunization: Secondary | ICD-10-CM

## 2020-11-05 MED ORDER — MODERNA COVID-19 BIVAL BOOSTER 50 MCG/0.5ML IM SUSP
INTRAMUSCULAR | 0 refills | Status: DC
  Filled 2020-11-05: qty 0.5, 1d supply, fill #0

## 2020-11-05 NOTE — Progress Notes (Signed)
   Covid-19 Vaccination Clinic  Name:  Laura [name]    MRN: [id number] DOB: [birth year]  11/05/2020  Laura [name] was observed post Covid-19 immunization for 15 minutes without incident. She was provided with Vaccine Information Sheet and instruction to access the V-Safe system.   Laura [name] was instructed to call 911 with any severe reactions post vaccine: Difficulty breathing  Swelling of face and throat  A fast heartbeat  A bad rash all over body  Dizziness and weakness

## 2020-11-06 ENCOUNTER — Other Ambulatory Visit (HOSPITAL_BASED_OUTPATIENT_CLINIC_OR_DEPARTMENT_OTHER): Payer: Self-pay

## 2020-11-06 MED ORDER — INFLUENZA VAC A&B SA ADJ QUAD 0.5 ML IM PRSY
PREFILLED_SYRINGE | INTRAMUSCULAR | 0 refills | Status: DC
  Filled 2020-11-06: qty 0.5, 1d supply, fill #0

## 2020-11-18 DIAGNOSIS — N390 Urinary tract infection, site not specified: Secondary | ICD-10-CM | POA: Diagnosis not present

## 2020-11-18 DIAGNOSIS — E538 Deficiency of other specified B group vitamins: Secondary | ICD-10-CM | POA: Diagnosis not present

## 2020-11-18 DIAGNOSIS — Z6826 Body mass index (BMI) 26.0-26.9, adult: Secondary | ICD-10-CM | POA: Diagnosis not present

## 2020-11-18 DIAGNOSIS — E663 Overweight: Secondary | ICD-10-CM | POA: Diagnosis not present

## 2020-11-18 DIAGNOSIS — E272 Addisonian crisis: Secondary | ICD-10-CM | POA: Diagnosis not present

## 2020-11-18 DIAGNOSIS — E279 Disorder of adrenal gland, unspecified: Secondary | ICD-10-CM | POA: Diagnosis not present

## 2020-11-18 DIAGNOSIS — F419 Anxiety disorder, unspecified: Secondary | ICD-10-CM | POA: Diagnosis not present

## 2020-11-18 DIAGNOSIS — E559 Vitamin D deficiency, unspecified: Secondary | ICD-10-CM | POA: Diagnosis not present

## 2020-11-18 DIAGNOSIS — M5030 Other cervical disc degeneration, unspecified cervical region: Secondary | ICD-10-CM | POA: Diagnosis not present

## 2020-11-18 DIAGNOSIS — E2749 Other adrenocortical insufficiency: Secondary | ICD-10-CM | POA: Diagnosis not present

## 2020-12-01 DIAGNOSIS — E782 Mixed hyperlipidemia: Secondary | ICD-10-CM | POA: Diagnosis not present

## 2020-12-01 DIAGNOSIS — I10 Essential (primary) hypertension: Secondary | ICD-10-CM | POA: Diagnosis not present

## 2020-12-01 DIAGNOSIS — K219 Gastro-esophageal reflux disease without esophagitis: Secondary | ICD-10-CM | POA: Diagnosis not present

## 2020-12-22 DIAGNOSIS — E2749 Other adrenocortical insufficiency: Secondary | ICD-10-CM | POA: Diagnosis not present

## 2020-12-22 DIAGNOSIS — G894 Chronic pain syndrome: Secondary | ICD-10-CM | POA: Diagnosis not present

## 2020-12-22 DIAGNOSIS — M5030 Other cervical disc degeneration, unspecified cervical region: Secondary | ICD-10-CM | POA: Diagnosis not present

## 2020-12-22 DIAGNOSIS — Z6827 Body mass index (BMI) 27.0-27.9, adult: Secondary | ICD-10-CM | POA: Diagnosis not present

## 2020-12-22 DIAGNOSIS — I10 Essential (primary) hypertension: Secondary | ICD-10-CM | POA: Diagnosis not present

## 2020-12-31 DIAGNOSIS — I10 Essential (primary) hypertension: Secondary | ICD-10-CM | POA: Diagnosis not present

## 2020-12-31 DIAGNOSIS — E782 Mixed hyperlipidemia: Secondary | ICD-10-CM | POA: Diagnosis not present

## 2020-12-31 DIAGNOSIS — K219 Gastro-esophageal reflux disease without esophagitis: Secondary | ICD-10-CM | POA: Diagnosis not present

## 2021-01-21 DIAGNOSIS — E2749 Other adrenocortical insufficiency: Secondary | ICD-10-CM | POA: Diagnosis not present

## 2021-01-21 DIAGNOSIS — I10 Essential (primary) hypertension: Secondary | ICD-10-CM | POA: Diagnosis not present

## 2021-01-21 DIAGNOSIS — E663 Overweight: Secondary | ICD-10-CM | POA: Diagnosis not present

## 2021-01-21 DIAGNOSIS — E538 Deficiency of other specified B group vitamins: Secondary | ICD-10-CM | POA: Diagnosis not present

## 2021-01-21 DIAGNOSIS — G894 Chronic pain syndrome: Secondary | ICD-10-CM | POA: Diagnosis not present

## 2021-01-21 DIAGNOSIS — Z6827 Body mass index (BMI) 27.0-27.9, adult: Secondary | ICD-10-CM | POA: Diagnosis not present

## 2021-01-21 DIAGNOSIS — F419 Anxiety disorder, unspecified: Secondary | ICD-10-CM | POA: Diagnosis not present

## 2021-02-11 ENCOUNTER — Encounter: Payer: Self-pay | Admitting: Internal Medicine

## 2021-02-11 ENCOUNTER — Ambulatory Visit: Payer: BC Managed Care – PPO | Admitting: Gastroenterology

## 2021-02-15 NOTE — Progress Notes (Signed)
History of Present Illness: Laura [name] is a 66 y.o. year old female last seen in 2021.  At that time she was evaluated for overactive bladder.  She has not been on any real symptomatic medical therapy since that time.  She comes in today because of 2 recent urinary tract infections.  Typically symptoms involve frequency urgency and dysuria.  No gross hematuria, no fever, chills or abdominal pain.  History significant for microscopic hematuria.  Evaluated by Dr. Benedetto Coons in [place], [place] back in 2015 and 2017.  Back then CT scan revealed normal kidneys, 1.6 cm left adrenal adenoma.  Cytology from 2015 revealed dysplasia, 2017 was normal.  She has not had any recent gross hematuria.  She states that she has had persistent microscopic hematuria for years. 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)    GERD (gastroesophageal reflux disease)    H/O hiatal hernia    Hepatitis B antibody positive    letter from red cross, Hep B core total ab positive   History of melanoma excision    01-05-2014   Hypertension    IBS (irritable bowel syndrome)    Nocturnal hypoxia    OA (osteoarthritis) of knee    RIGHT   RLS (restless legs syndrome)    Rotator cuff impingement syndrome of left shoulder    Shortness of breath dyspnea    Unspecified hereditary and idiopathic peripheral neuropathy     Past Surgical History:  Procedure Laterality Date   ABDOMINAL HYSTERECTOMY     ANTERIOR CERVICAL DECOMP/DISCECTOMY FUSION  2000   C4 -- C5   BIOPSY  08/11/2015   Procedure: BIOPSY;  Surgeon: Daneil Dolin, MD;  [facility];  Service: Endoscopy;;  gastric polyp;   cardiac cath     unremarkable   CARPAL TUNNEL RELEASE Right [place]  07/2007   Dr. Gala Romney: Terminal ileum normal. Status post segmental colonic mucosal biopsy.   COLONOSCOPY WITH PROPOFOL N/A  08/11/2015   normal colonoscopy with benign colonic mucosa   ESOPHAGOGASTRODUODENOSCOPY  07/2007   Dr. Gala Romney: Small hiatal hernia, duodenal biopsy unremarkable. Fundic gland gastric polyps.   ESOPHAGOGASTRODUODENOSCOPY (EGD) WITH PROPOFOL N/A 08/11/2015   LA Grade A esophagitis, small hiatal hernia, few gastric polyps, normal duodenum (benign fundic gland polyp)   KNEE ARTHROSCOPY WITH MEDIAL MENISECTOMY Right 05/25/2013   Procedure: RIGHT KNEE ARTHROSCOPY WITH PARTIAL LATERAL and MEDIAL MENISECTOMY AND CHONDROPLASTY;  Surgeon: Sydnee Cabal, MD;  Location: [facility];  Service: Orthopedics;  Laterality: Right;   LUMBAR DISC SURGERY  08-22-2003   LEFT  L5  --  S1   MOHS SURGERY  01-05-2014   LEFT SIDE OF FACE   SHOULDER ARTHROSCOPY WITH SUBACROMIAL DECOMPRESSION Left 01/29/2014   Procedure: LEFT SHOULDER ARTHROSCOPY WITH SUBACROMIAL DECOMPRESSION/DISTAL CLAVICLE RESECTION AND DEBRIDEMENT;  Surgeon: Sydnee Cabal, MD;  Location: [facility];  Service: Orthopedics;  Laterality: Left;   TOTAL ABDOMINAL HYSTERECTOMY W/ BILATERAL SALPINGOOPHORECTOMY  1999    Home Medications:  (Not in a hospital admission)   Allergies:  Allergies  Allergen Reactions   Aspirin Other (See Comments)    Severe stomach upset due to IBS.   Celebrex [Celecoxib] Hives    Family History  Problem Relation Age of Onset   Lung cancer Mother    HIV/AIDS Father  Crohn's disease Cousin    Breast cancer Maternal Aunt    Sjogren's syndrome Sister    Diabetes Brother    Heart attack Daughter    Drug abuse Son    ADD / ADHD Grandchild    Colon cancer Neg Hx    Colon polyps Neg Hx     Social History:  reports that she has been smoking cigarettes. She has a 20.00 pack-year smoking history. She has never used smokeless tobacco. She reports that she does not drink alcohol and does not use drugs.  ROS: A complete review of systems was performed.  All systems are negative except for  pertinent findings as noted.  Physical Exam:  Vital signs in last 24 hours: @VSRANGES @ General:  Alert and oriented, No acute distress HEENT: Normocephalic, atraumatic Neck: No JVD or lymphadenopathy Cardiovascular: Regular rate  Lungs: Normal inspiratory/expiratory excursion Neurologic: Grossly intact  I have reviewed prior pt notes  I have reviewed notes from referring/previous physicians--Dr. Exie Parody  I have reviewed urinalysis/cytology results  I have independently reviewed prior imaging-I reviewed images from CT scan from 2015 with patient    Impression/Assessment:  1.  History of microscopic hematuria with essentially negative evaluation in 2015 and 2017.  This persists on urinalysis today  2.  History of recent urinary tract infection, uncomplicated  3.  History of left adrenal adenoma.  Plan:  1.  Reassurance about her prior evaluation-I will think she needs repeat microscopic hematuria evaluation today  2.  I will culture her urine but no antibiotics administered  3.  Causative factors of urinary tract infections in middle-aged/older women discussed.  For now, I would recommend short term probiotic, cranberry capsules 2-3 times a day and return as needed  Jorja Loa 02/15/2021, 5:32 PM  Lillette Boxer. Wilhelmine Krogstad MD

## 2021-02-16 ENCOUNTER — Other Ambulatory Visit: Payer: Self-pay

## 2021-02-16 ENCOUNTER — Ambulatory Visit (INDEPENDENT_AMBULATORY_CARE_PROVIDER_SITE_OTHER): Payer: PPO | Admitting: "Endocrinology

## 2021-02-16 ENCOUNTER — Encounter: Payer: Self-pay | Admitting: "Endocrinology

## 2021-02-16 VITALS — BP 136/82 | HR 84 | Ht 62.0 in | Wt 156.0 lb

## 2021-02-16 DIAGNOSIS — E2740 Unspecified adrenocortical insufficiency: Secondary | ICD-10-CM | POA: Diagnosis not present

## 2021-02-16 DIAGNOSIS — E236 Other disorders of pituitary gland: Secondary | ICD-10-CM

## 2021-02-16 NOTE — Progress Notes (Signed)
Endocrinology Consult Note                                            02/16/2021, 5:40 PM   Subjective:    Patient ID: Laura [name], female    DOB: [birth year], PCP Redmond School, MD   Past Medical History:  Diagnosis Date   Acromioclavicular joint arthritis    left shoulder   Anxiety    Cancer ([place])    skin (facial)    GERD (gastroesophageal reflux disease)    H/O hiatal hernia    Hepatitis B antibody positive    letter from red cross, Hep B core total ab positive   History of melanoma excision    01-05-2014   Hypertension    IBS (irritable bowel syndrome)    Nocturnal hypoxia    OA (osteoarthritis) of knee    RIGHT   RLS (restless legs syndrome)    Rotator cuff impingement syndrome of left shoulder    Shortness of breath dyspnea    Unspecified hereditary and idiopathic peripheral neuropathy    Past Surgical History:  Procedure Laterality Date   ABDOMINAL HYSTERECTOMY     ANTERIOR CERVICAL DECOMP/DISCECTOMY FUSION  2000   C4 -- C5   BIOPSY  08/11/2015   Procedure: BIOPSY;  Surgeon: Daneil Dolin, MD;  [facility];  Service: Endoscopy;;  gastric polyp;   cardiac cath     unremarkable   CARPAL TUNNEL RELEASE Right [place]  07/2007   Dr. Gala Romney: Terminal ileum normal. Status post segmental colonic mucosal biopsy.   COLONOSCOPY WITH PROPOFOL N/A 08/11/2015   normal colonoscopy with benign colonic mucosa   ESOPHAGOGASTRODUODENOSCOPY  07/2007   Dr. Gala Romney: Small hiatal hernia, duodenal biopsy unremarkable. Fundic gland gastric polyps.   ESOPHAGOGASTRODUODENOSCOPY (EGD) WITH PROPOFOL N/A 08/11/2015   LA Grade A esophagitis, small hiatal hernia, few gastric polyps, normal duodenum (benign fundic gland polyp)   KNEE ARTHROSCOPY WITH MEDIAL MENISECTOMY Right 05/25/2013   Procedure: RIGHT KNEE ARTHROSCOPY WITH PARTIAL LATERAL and MEDIAL MENISECTOMY AND CHONDROPLASTY;  Surgeon: Sydnee Cabal, MD;  Location: [place];  Service: Orthopedics;  Laterality: Right;   LUMBAR DISC SURGERY  08-22-2003   LEFT  L5  --  S1   MOHS SURGERY  01-05-2014   LEFT SIDE OF FACE   SHOULDER ARTHROSCOPY WITH SUBACROMIAL DECOMPRESSION Left 01/29/2014   Procedure: LEFT SHOULDER ARTHROSCOPY WITH SUBACROMIAL DECOMPRESSION/DISTAL CLAVICLE RESECTION AND DEBRIDEMENT;  Surgeon: Sydnee Cabal, MD;  Location: [place];  Service: Orthopedics;  Laterality: Left;   TOTAL ABDOMINAL HYSTERECTOMY W/ BILATERAL SALPINGOOPHORECTOMY  1999   Social History   Socioeconomic History   Marital status: Married    Spouse name: [name]   Number of children: 3   Years of education: GED   Highest education level: Not on file  Occupational History    Comment: not employed  Tobacco Use   Smoking status: Former    Packs/day: 1.00    Years: 20.00    Pack years: 20.00    Types: Cigarettes    Quit date: 11/2020    Years since quitting: 0.2   Smokeless tobacco: Never   Tobacco comments:    smokes 3 cigarettes per day-- 02/29/2020  Vaping Use   Vaping Use: Never used  Substance and Sexual Activity  Alcohol use: No    Alcohol/week: 0.0 standard drinks   Drug use: No   Sexual activity: Not on file  Other Topics Concern   Not on file  Social History Narrative   Patient lives at home with her husband [name]).    Patient is not working as time but she trying for disability.   Education 10th grade   Caffeine daily coffee and mountain dew . One can daily.   .      Social Determinants of Health   Financial Resource Strain: Not on file  Food Insecurity: Not on file  Transportation Needs: Not on file  Physical Activity: Not on file  Stress: Not on file  Social Connections: Not on file   Family History  Problem Relation Age of Onset   Heart attack Mother    Lung cancer Mother    HIV/AIDS Father    Sjogren's syndrome Sister    Diabetes Brother    Breast cancer Maternal Aunt    Heart attack Daughter     Drug abuse Son    ADD / ADHD Grandchild    Crohn's disease Cousin    Colon cancer Neg Hx    Colon polyps Neg Hx    Outpatient Encounter Medications as of 02/16/2021  Medication Sig   Cyanocobalamin (VITAMIN B-12 IJ) Inject as directed every 30 (thirty) days.   ALPRAZolam (XANAX) 0.5 MG tablet Take 0.5 mg by mouth as needed.   ascorbic acid (VITAMIN C) 500 MG tablet Take 500 mg by mouth 2 (two) times daily. (Patient not taking: Reported on 02/16/2021)   COVID-19 mRNA bivalent vaccine, Moderna, (MODERNA COVID-19 BIVAL BOOSTER) 50 MCG/0.5ML injection Inject into the muscle.   gabapentin (NEURONTIN) 100 MG capsule Take 1 capsule (100 mg total) by mouth 3 (three) times daily. One three times daily (Patient not taking: Reported on 02/16/2021)   hydrochlorothiazide (MICROZIDE) 12.5 MG capsule Take 12.5 mg by mouth every morning.    HYDROcodone-acetaminophen (NORCO) 10-325 MG tablet Take 1 tablet by mouth every 6 (six) hours as needed.   hydrocortisone (CORTEF) 5 MG tablet Take 10 mg by mouth 2 (two) times daily.   influenza vaccine adjuvanted (FLUAD) 0.5 ML injection Inject into the muscle.   Na Sulfate-K Sulfate-Mg Sulf (SUPREP BOWEL PREP KIT) 17.5-3.13-1.6 GM/177ML SOLN Take 1 kit by mouth as directed.   Omega-3 Fatty Acids (FISH OIL PO) Take by mouth daily.    PARoxetine (PAXIL) 20 MG tablet Take 10 mg by mouth daily. (Patient not taking: Reported on 02/16/2021)   RABEprazole (ACIPHEX) 20 MG tablet TAKE 1 TABLET BY MOUTH TWICE DAILY   rOPINIRole (REQUIP) 0.5 MG tablet Take 0.5 mg by mouth at bedtime.   traZODone (DESYREL) 150 MG tablet Take 150 mg by mouth at bedtime.   ursodiol (ACTIGALL) 500 MG tablet TAKE 1 TABLET BY MOUTH TWICE DAILY   No facility-administered encounter medications on file as of 02/16/2021.   ALLERGIES: Allergies  Allergen Reactions   Aspirin Other (See Comments)    Severe stomach upset due to IBS.   Celebrex [Celecoxib] Hives   Venlafaxine     VACCINATION  STATUS: Immunization History  Administered Date(s) Administered   Influenza-Unspecified 12/02/2017   Moderna Covid-19 Vaccine Bivalent Booster 1yr & up 11/05/2020   Moderna Sars-Covid-2 Vaccination 04/18/2019, 05/14/2019, 01/08/2020    HPI Laura [name] 66y.o. female who presents today with a medical history as above. she is being seen in consultation for adrenal insufficiency requested by FRedmond School MD.  She is accompanied by her husband to the clinic.  History is obtained directly from the patient as well as chart review. She gives history of being diagnosed with adrenal insufficiency 3 months ago which required initiation of hydrocortisone 10 mg p.o. twice daily. She reports improvement after she was initiated on hydrocortisone.  She has history of exposure to steroids intermittently as well as treatment with hydrocodone for the last 15 years. Prior to initiation of her hydrocortisone in August her baseline cortisol low at 1.1, baseline ACTH was less than 1.5.    She denies any history of abdominal/adrenal injury.  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  MRI in 2019 was stable.   Review of Systems  Constitutional:  + Fluctuating body weight, + fatigue, no subjective hyperthermia, no subjective hypothermia Eyes: no blurry vision, no xerophthalmia ENT: no sore throat, no nodules palpated in throat, no dysphagia/odynophagia, no hoarseness Cardiovascular: no Chest Pain, no Shortness of Breath, no palpitations, no leg swelling Respiratory: no cough, no shortness of breath Gastrointestinal: no Nausea/Vomiting/Diarhhea Musculoskeletal: no muscle/joint aches Skin: no rashes Neurological: no tremors, no numbness, no tingling, no dizziness Psychiatric: no depression, no anxiety  Objective:    Vitals with BMI 02/16/2021 05/28/2020 02/29/2020  Height _0  _1  _2   Weight 156 lbs 145 lbs 3 oz 142 lbs 3 oz  BMI 28.53 81.19 14.78  Systolic 295  621 308  Diastolic 82 82 76  Pulse 84 88 102    BP 136/82    Pulse 84    Ht _3  (1.575 m)    Wt 156 lb (70.8 kg)    BMI 28.53 kg/m   Wt Readings from Last 3 Encounters:  02/16/21 156 lb (70.8 kg)  05/28/20 145 lb 3.2 oz (65.9 kg)  02/29/20 142 lb 3.2 oz (64.5 kg)    Physical Exam  Constitutional:  Body mass index is 28.53 kg/m.,  not in acute distress, normal state of mind Eyes: PERRLA, EOMI, no exophthalmos ENT: moist mucous membranes, no gross thyromegaly, no gross cervical lymphadenopathy Cardiovascular: normal precordial activity, Regular Rate and Rhythm, no Murmur/Rubs/Gallops Respiratory:  adequate breathing efforts, no gross chest deformity, Clear to auscultation bilaterally Gastrointestinal: abdomen soft, Non -tender, No distension, Bowel Sounds present, no gross organomegaly Musculoskeletal: no gross deformities, strength intact in all four extremities Skin: moist, warm, no rashes Neurological: no tremor with outstretched hands, Deep tendon reflexes normal in bilateral lower extremities.  CMP ( most recent) CMP     Component Value Date/Time   NA 138 08/04/2015 0915   NA 140 07/27/2012 0934   K 3.6 08/04/2015 0915   CL 104 08/04/2015 0915   CO2 23 08/04/2015 0915   GLUCOSE 155 (H) 08/04/2015 0915   BUN 20 08/04/2015 0915   BUN 12 07/27/2012 0934   CREATININE 0.90 02/03/2017 0915   CALCIUM 9.1 08/04/2015 0915   PROT 6.8 09/30/2017 0912   ALBUMIN 4.2 09/30/2017 0912   AST 18 09/30/2017 0912   ALT 24 09/30/2017 0912   ALKPHOS 117 09/30/2017 0912   BILITOT 0.4 09/30/2017 0912   GFRNONAA >60 08/04/2015 0915   GFRAA >60 08/04/2015 0915     Diabetic Labs (most recent): Lab Results  Component Value Date   HGBA1C 5.6 07/27/2012   September 23, 2020: Vitamin D 30.8, vitamin B12 224 low, cortisol 1.1 low, cortisol p.m. 2.3 low ACTH less than 1.5, prolactin 6.1   Assessment & Plan:   1. Adrenal insufficiency (  [place])   - Laura [name]  is being seen at a kind  request of Redmond School, MD. - I have reviewed her available endocrine records and clinically evaluated the patient. 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 -Ideally, she would need ACTH stimulation test however this will require withdrawal of hydrocortisone.  Patient will not tolerate this withdrawal for now.  She is advised to continue hydrocortisone 10 mg p.o. twice daily-at 8 AM, and at 1 PM. She will have repeat CMP, thyroid function test before her next visit in 3 months. 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  4 mm Cabbell study was documented in 2019.  She will not need further imaging or MRI.  I advised her to wear a medical alert-recent labs or necklace depicting adrenal insufficiency and need for steroids. - I did not initiate any new prescriptions today.  Sick day rules are discussed.  She will not need mineralocorticoid replacement at this time. - she is advised to maintain close follow up with Redmond School, MD for primary care needs.   - Time spent with the patient: 62 minutes, of which >50% was spent in  counseling her about her adrenal insufficiency and the rest in obtaining information about her symptoms, reviewing her previous labs/studies ( including abstractions from other facilities),  evaluations, and treatments,  and developing a plan to confirm diagnosis and long term treatment based on the latest standards of care/guidelines; and documenting her care.  Laura [name] participated in the discussions, expressed understanding, and voiced agreement with the above plans.  All questions were answered to her satisfaction. she is encouraged to contact clinic should she have any questions or concerns prior to her return visit.  Follow up plan: Return in about 3 months (around 05/17/2021) for F/U with Pre-visit Labs.   Glade Lloyd, MD [facility]  [facility] [address] [place], [place] [address] Phone: [phone]  Fax: [phone]     02/16/2021, 5:40 PM  This note was partially dictated with voice recognition software. Similar sounding words can be transcribed inadequately or may not  be corrected upon review.

## 2021-02-17 ENCOUNTER — Encounter: Payer: Self-pay | Admitting: Urology

## 2021-02-17 ENCOUNTER — Ambulatory Visit (INDEPENDENT_AMBULATORY_CARE_PROVIDER_SITE_OTHER): Payer: BC Managed Care – PPO | Admitting: Urology

## 2021-02-17 VITALS — BP 163/84 | HR 98 | Wt 159.8 lb

## 2021-02-17 DIAGNOSIS — R3915 Urgency of urination: Secondary | ICD-10-CM | POA: Diagnosis not present

## 2021-02-17 DIAGNOSIS — N3000 Acute cystitis without hematuria: Secondary | ICD-10-CM | POA: Diagnosis not present

## 2021-02-17 DIAGNOSIS — R350 Frequency of micturition: Secondary | ICD-10-CM | POA: Diagnosis not present

## 2021-02-17 DIAGNOSIS — E538 Deficiency of other specified B group vitamins: Secondary | ICD-10-CM | POA: Diagnosis not present

## 2021-02-17 LAB — MICROSCOPIC EXAMINATION
Bacteria, UA: NONE SEEN
Renal Epithel, UA: NONE SEEN /hpf

## 2021-02-17 LAB — URINALYSIS, ROUTINE W REFLEX MICROSCOPIC
Bilirubin, UA: NEGATIVE
Ketones, UA: NEGATIVE
Nitrite, UA: NEGATIVE
Protein,UA: NEGATIVE
Specific Gravity, UA: 1.025 (ref 1.005–1.030)
Urobilinogen, Ur: 0.2 mg/dL (ref 0.2–1.0)
pH, UA: 5.5 (ref 5.0–7.5)

## 2021-02-17 NOTE — Progress Notes (Signed)
Urological Symptom Review  Patient is experiencing the following symptoms: Frequent urination Hard to postpone urination Burning/pain with urination Get up at night to urinate Leakage of urine Blood in urine Urinary tract infection   Review of Systems  Gastrointestinal (upper)  : Negative for upper GI symptoms  Gastrointestinal (lower) : Negative for lower GI symptoms  Constitutional : Fatigue  Skin: Negative for skin symptoms  Eyes: Negative for eye symptoms  Ear/Nose/Throat : Sinus problems  Hematologic/Lymphatic: Negative for Hematologic/Lymphatic symptoms  Cardiovascular : Negative for cardiovascular symptoms  Respiratory : Negative for respiratory symptoms  Endocrine: Excessive thirst  Musculoskeletal: Negative for musculoskeletal symptoms  Neurological: Negative for neurological symptoms  Psychologic: Negative for psychiatric symptoms

## 2021-02-18 ENCOUNTER — Other Ambulatory Visit: Payer: Self-pay

## 2021-02-18 ENCOUNTER — Telehealth: Payer: Self-pay

## 2021-02-18 ENCOUNTER — Ambulatory Visit (INDEPENDENT_AMBULATORY_CARE_PROVIDER_SITE_OTHER): Payer: BC Managed Care – PPO | Admitting: Gastroenterology

## 2021-02-18 ENCOUNTER — Encounter: Payer: Self-pay | Admitting: Gastroenterology

## 2021-02-18 VITALS — BP 151/84 | HR 81 | Temp 97.5°F | Ht 61.5 in | Wt 159.8 lb

## 2021-02-18 DIAGNOSIS — K529 Noninfective gastroenteritis and colitis, unspecified: Secondary | ICD-10-CM

## 2021-02-18 DIAGNOSIS — R1030 Lower abdominal pain, unspecified: Secondary | ICD-10-CM

## 2021-02-18 DIAGNOSIS — R1310 Dysphagia, unspecified: Secondary | ICD-10-CM | POA: Diagnosis not present

## 2021-02-18 DIAGNOSIS — K743 Primary biliary cirrhosis: Secondary | ICD-10-CM | POA: Diagnosis not present

## 2021-02-18 MED ORDER — RIFAXIMIN 550 MG PO TABS: 550.0000 mg | ORAL_TABLET | Freq: Three times a day (TID) | ORAL | 0 refills | Status: AC

## 2021-02-18 NOTE — Patient Instructions (Addendum)
We will arrange colonoscopy, upper endoscopy and dilation with Dr. Gala Romney in March 2023.    I have sent in a course of Xifaxan to take 3 times a day for 2 weeks.   We are arranging a CT scan in the near future.  We will see you in 6 months!  Will be thinking about you and your husband!  I enjoyed seeing you again today! As you know, I value our relationship and want to provide genuine, compassionate, and quality care. I welcome your feedback. If you receive a survey regarding your visit,  I greatly appreciate you taking time to fill this out. See you next time!  Annitta Needs, PhD, ANP-BC [facility]

## 2021-02-18 NOTE — Telephone Encounter (Signed)
CT abd/pelvis w/contrast scheduled for 03/06/21 at 2:30pm, arrive at 2:00pm. NPO 4 hours prior to test and pick up contrast.  Called pt, informed her of CT appt. Appt letter mailed. Informed pt we will call her to schedule TCS/EGD/DIL when Dr. Roseanne Kaufman March schedule is available.  No PA needed for CT abd/pelvis per AIM website.

## 2021-02-18 NOTE — Progress Notes (Signed)
Referring Provider: Redmond School, MD Primary Care Physician:  Redmond School, MD Primary GI: Dr. Gala Romney     Chief Complaint  Patient presents with   Constipation    Sometimes bm is balls. No bleeding. Sometimes gets up in night to have bm   Diarrhea    Sometimes bm is watery.     HPI:   Laura [name] is a 66 y.o. female presenting today with a history of chronic diarrhea, previously evaluated extensively and worked up for microscopic colitis and celiac disease. Ileocolonoscopy in July 2017 negative for microscopic colitis. Felt to have IBS-D.  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 Started on Urso and elastography with minimal fibrosis. [place] on file from May 2022 with fatty liver. Labs from Feb 2022 reviewed:Hgb 15.4, Hct 45.1, Platelets 347. BUN 19, Creatinine 0.94, Tbili 0.4, Alk Phos 102, AST 11, ALT 7. TSH 1.1.   Patient notes chronic diarrhea. Intermittent constipation at times but predominantly looser stools. Episode of rectal bleeding earlier this year with plans for colonoscopy but had to put on hold due to multiple family issues. She would like to pursue this now. Doreene Nest has worked well in the past for her a few years ago. Desires another course of this.   GERD: well-controlled on Aciphex BID. Notes occasional choking on food. Feels neck surgery in 2000 may be contributing to this.   Notes new onset lower abdominal pain/soreness. Associated nausea with this. Intermittent. No precipitating or relieving factors.   She was recently diagnosed with adrenal insufficiency and sees Dr. Dorris Fetch. She reports a DEXA scan earlier this year, which we will request. She continues on Urso and Vit D.        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)    GERD (gastroesophageal reflux disease)    H/O hiatal hernia    Hepatitis B antibody positive     letter from red cross, Hep B core total ab positive   History of melanoma excision    01-05-2014   Hypertension    IBS (irritable bowel syndrome)    Nocturnal hypoxia    OA (osteoarthritis) of knee    RIGHT   RLS (restless legs syndrome)    Rotator cuff impingement syndrome of left shoulder    Shortness of breath dyspnea    Unspecified hereditary and idiopathic peripheral neuropathy     Past Surgical History:  Procedure Laterality Date   ABDOMINAL HYSTERECTOMY     ANTERIOR CERVICAL DECOMP/DISCECTOMY FUSION  2000   C4 -- C5   BIOPSY  08/11/2015   Procedure: BIOPSY;  Surgeon: Daneil Dolin, MD;  [facility];  Service: Endoscopy;;  gastric polyp;   cardiac cath     unremarkable   CARPAL TUNNEL RELEASE Right [place]  07/2007   Dr. Gala Romney: Terminal ileum normal. Status post segmental colonic mucosal biopsy.   COLONOSCOPY WITH PROPOFOL N/A 08/11/2015   normal colonoscopy with benign colonic mucosa   ESOPHAGOGASTRODUODENOSCOPY  07/2007   Dr. Gala Romney: Small hiatal hernia, duodenal biopsy unremarkable. Fundic gland gastric polyps.   ESOPHAGOGASTRODUODENOSCOPY (EGD) WITH PROPOFOL N/A 08/11/2015   LA Grade A esophagitis, small hiatal hernia, few gastric polyps, normal duodenum (benign fundic gland polyp)   KNEE ARTHROSCOPY WITH MEDIAL MENISECTOMY Right 05/25/2013  Procedure: RIGHT KNEE ARTHROSCOPY WITH PARTIAL LATERAL and MEDIAL MENISECTOMY AND CHONDROPLASTY;  Surgeon: Sydnee Cabal, MD;  Location: [place];  Service: Orthopedics;  Laterality: Right;   LUMBAR DISC SURGERY  08-22-2003   LEFT  L5  --  S1   MOHS SURGERY  01-05-2014   LEFT SIDE OF FACE   SHOULDER ARTHROSCOPY WITH SUBACROMIAL DECOMPRESSION Left 01/29/2014   Procedure: LEFT SHOULDER ARTHROSCOPY WITH SUBACROMIAL DECOMPRESSION/DISTAL CLAVICLE RESECTION AND DEBRIDEMENT;  Surgeon: Sydnee Cabal, MD;  Location: [place];  Service: Orthopedics;   Laterality: Left;   TOTAL ABDOMINAL HYSTERECTOMY W/ BILATERAL SALPINGOOPHORECTOMY  1999    Current Outpatient Medications  Medication Sig Dispense Refill   ALPRAZolam (XANAX) 0.5 MG tablet Take 0.5 mg by mouth as needed.     Cyanocobalamin (VITAMIN B-12 IJ) Inject as directed every 30 (thirty) days.     hydrochlorothiazide (MICROZIDE) 12.5 MG capsule Take 12.5 mg by mouth every morning.      HYDROcodone-acetaminophen (NORCO) 10-325 MG tablet Take 1 tablet by mouth every 6 (six) hours as needed.     hydrocortisone (CORTEF) 5 MG tablet Take 10 mg by mouth 2 (two) times daily.     Omega-3 Fatty Acids (FISH OIL PO) Take by mouth daily.      RABEprazole (ACIPHEX) 20 MG tablet TAKE 1 TABLET BY MOUTH TWICE DAILY 60 tablet 5   rifaximin (XIFAXAN) 550 MG TABS tablet Take 1 tablet (550 mg total) by mouth 3 (three) times daily for 14 days. 42 tablet 0   rOPINIRole (REQUIP) 0.5 MG tablet Take 0.5 mg by mouth at bedtime.     traZODone (DESYREL) 150 MG tablet Take 150 mg by mouth at bedtime.     ursodiol (ACTIGALL) 500 MG tablet TAKE 1 TABLET BY MOUTH TWICE DAILY 60 tablet 5   No current facility-administered medications for this visit.    Allergies as of 02/18/2021 - Review Complete 02/18/2021  Allergen Reaction Noted   Aspirin Other (See Comments) 06/16/2015   Celebrex [celecoxib] Hives 07/27/2012   Venlafaxine  02/16/2021    Family History  Problem Relation Age of Onset   Heart attack Mother    Lung cancer Mother    HIV/AIDS Father    Sjogren's syndrome Sister    Diabetes Brother    Breast cancer Maternal Aunt    Heart attack Daughter    Drug abuse Son    ADD / ADHD Grandchild    Crohn's disease Cousin    Colon cancer Neg Hx    Colon polyps Neg Hx     Social History   Socioeconomic History   Marital status: Married    Spouse name: Laura [name]   Number of children: 3   Years of education: GED   Highest education level: Not on file  Occupational History    Comment: not employed   Tobacco Use   Smoking status: Former    Packs/day: 1.00    Years: 20.00    Pack years: 20.00    Types: Cigarettes    Quit date: 11/2020    Years since quitting: 0.2   Smokeless tobacco: Never   Tobacco comments:    smokes 3 cigarettes per day-- 02/29/2020  Vaping Use   Vaping Use: Never used  Substance and Sexual Activity   Alcohol use: Yes    Comment: rare   Drug use: No   Sexual activity: Not on file  Other Topics Concern   Not on file  Social History Narrative   Patient  lives at home with her husband Laura [name]).    Patient is not working as time but she trying for disability.   Education 10th grade   Caffeine daily coffee and mountain dew . One can daily.   .      Social Determinants of Health   Financial Resource Strain: Not on file  Food Insecurity: Not on file  Transportation Needs: Not on file  Physical Activity: Not on file  Stress: Not on file  Social Connections: Not on file    Review of Systems: Gen: Denies fever, chills, anorexia. Denies fatigue, weakness, weight loss.  CV: Denies chest pain, palpitations, syncope, peripheral edema, and claudication. Resp: Denies dyspnea at rest, cough, wheezing, coughing up blood, and pleurisy. GI: see HPI Derm: Denies rash, itching, dry skin Psych: Denies depression, anxiety, memory loss, confusion. No homicidal or suicidal ideation.  Heme: Denies bruising, bleeding, and enlarged lymph nodes.  Physical Exam: BP (!) 151/84    Pulse 81    Temp (!) 97.5 F (36.4 C) (Temporal)    Ht 5' 1.5" (1.562 m)    Wt 159 lb 12.8 oz (72.5 kg)    BMI 29.70 kg/m  General:   Alert and oriented. No distress noted. Pleasant and cooperative.  Head:  Normocephalic and atraumatic. Eyes:  Conjuctiva clear without scleral icterus. Mouth:  mask in place Abdomen:  +BS, soft, mild TTP RLQ and LLQ/lower abdomen and non-distended. No rebound or guarding. No HSM or masses noted. Msk:  Symmetrical without gross deformities. Normal  posture. Extremities:  Without edema. Neurologic:  Alert and  oriented x4 Psych:  Alert and cooperative. Normal mood and affect.  ASSESSMENT: Layan Zalenski is a 66 y.o. female presenting today  with a history of chronic diarrhea, previously evaluated extensively and worked up for microscopic colitis and celiac disease. Ileocolonoscopy in July 2017 negative for microscopic colitis. Felt to have IBS-D at that time.  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 Returning today in follow-up.    Diarrhea: intermittent. Previously responded to course of Xifaxan a year ago. Will pursue another round. She did have one episode of rectal bleeding earlier this year but none since. As last colonoscopy in 2017, she is desirous of colonoscopy.   GERD: doing well with Aciphex BID.   Dysphagia: history of esophagitis. Last EGD in 2017. Feels that this may be related to prior neck surgery in 2000. However, she does endorse choking and solid food dysphagia at times. Will pursue EGD/dilation.   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 Vit D supplementation. Will obtain DEXA scan results. [place] on file from earlier this year with fatty liver. Obtain outside labs from PCP that were done recently.   Lower abdominal pain: new onset. Associated nausea. No fever; no precipitating or relieving factors. Vague symptoms. CT planned.    Will pursue colonoscopy due to history of rectal bleeding, EGD/dilation due to dysphagia in future.    PLAN:  Proceed with colonoscopy/EGD/dilation by Dr. Gala Romney in near future using Propofol: the risks, benefits, and alternatives have been discussed with the patient in detail. The patient states understanding and desires to proceed. Patient desires in March 2023 due to husband's radiation treatments   CT abd/pelvis with contrast  Continue Urso BID  Continue Aciphex BID  Round of  Xifaxan X 2 weeks  Obtain outside DEXA and labs from PCP  6 month follow-up  Annitta Needs, PhD, ANP-BC [facility]

## 2021-02-19 DIAGNOSIS — G894 Chronic pain syndrome: Secondary | ICD-10-CM | POA: Diagnosis not present

## 2021-02-19 DIAGNOSIS — I10 Essential (primary) hypertension: Secondary | ICD-10-CM | POA: Diagnosis not present

## 2021-02-24 DIAGNOSIS — Z1231 Encounter for screening mammogram for malignant neoplasm of breast: Secondary | ICD-10-CM | POA: Diagnosis not present

## 2021-03-06 ENCOUNTER — Ambulatory Visit (HOSPITAL_COMMUNITY)
Admission: RE | Admit: 2021-03-06 | Discharge: 2021-03-06 | Disposition: A | Discharge status: Home / Self Care | Payer: BC Managed Care – PPO | Source: Ambulatory Visit | Attending: Gastroenterology | Admitting: Gastroenterology

## 2021-03-06 ENCOUNTER — Other Ambulatory Visit: Payer: Self-pay

## 2021-03-06 DIAGNOSIS — I700 Atherosclerosis of aorta: Secondary | ICD-10-CM | POA: Diagnosis not present

## 2021-03-06 DIAGNOSIS — R109 Unspecified abdominal pain: Secondary | ICD-10-CM | POA: Diagnosis not present

## 2021-03-06 DIAGNOSIS — R1030 Lower abdominal pain, unspecified: Secondary | ICD-10-CM | POA: Diagnosis not present

## 2021-03-06 LAB — POCT I-STAT CREATININE: Creatinine, Ser: 1.2 mg/dL — ABNORMAL HIGH (ref 0.44–1.00)

## 2021-03-06 MED ORDER — IOHEXOL 300 MG/ML  SOLN
100.0000 mL | Freq: Once | INTRAMUSCULAR | Status: AC | PRN
  Administered 2021-03-06: 100 mL via INTRAVENOUS

## 2021-03-27 DIAGNOSIS — Z1331 Encounter for screening for depression: Secondary | ICD-10-CM | POA: Diagnosis not present

## 2021-03-27 DIAGNOSIS — Z0000 Encounter for general adult medical examination without abnormal findings: Secondary | ICD-10-CM | POA: Diagnosis not present

## 2021-03-27 DIAGNOSIS — Z6828 Body mass index (BMI) 28.0-28.9, adult: Secondary | ICD-10-CM | POA: Diagnosis not present

## 2021-03-27 DIAGNOSIS — E272 Addisonian crisis: Secondary | ICD-10-CM | POA: Diagnosis not present

## 2021-03-27 DIAGNOSIS — I10 Essential (primary) hypertension: Secondary | ICD-10-CM | POA: Diagnosis not present

## 2021-03-27 DIAGNOSIS — M5030 Other cervical disc degeneration, unspecified cervical region: Secondary | ICD-10-CM | POA: Diagnosis not present

## 2021-03-27 DIAGNOSIS — E538 Deficiency of other specified B group vitamins: Secondary | ICD-10-CM | POA: Diagnosis not present

## 2021-03-27 DIAGNOSIS — E559 Vitamin D deficiency, unspecified: Secondary | ICD-10-CM | POA: Diagnosis not present

## 2021-03-30 ENCOUNTER — Telehealth: Payer: Self-pay | Admitting: *Deleted

## 2021-03-30 DIAGNOSIS — K529 Noninfective gastroenteritis and colitis, unspecified: Secondary | ICD-10-CM

## 2021-03-30 DIAGNOSIS — K2100 Gastro-esophageal reflux disease with esophagitis, without bleeding: Secondary | ICD-10-CM

## 2021-03-30 NOTE — Telephone Encounter (Signed)
Called pt to offer 3/6 for procedure date but pt unable to do this date.

## 2021-03-31 NOTE — Telephone Encounter (Signed)
LMOVM to call back 

## 2021-04-01 NOTE — Telephone Encounter (Signed)
LMOVM to call back. Letter mailed. 

## 2021-04-02 NOTE — Telephone Encounter (Signed)
Patient returned call. Unable to do times offered. Will call once we have April schedule ?

## 2021-04-03 ENCOUNTER — Other Ambulatory Visit: Payer: Self-pay

## 2021-04-09 NOTE — Telephone Encounter (Signed)
LMOVM to call back 

## 2021-04-09 NOTE — Addendum Note (Signed)
Addended by: Cheron Every on: 04/09/2021 01:51 PM   Modules accepted: Orders

## 2021-04-09 NOTE — Telephone Encounter (Signed)
Patient returned call. She has been scheduled for 4/3 at 10:30am. Already had suprep at home. Will mail instructions. Lab letter also mailed ? ?Per aim "The following solutions for the service date entered do not require Pre-Authorization by [employer]. Please note that benefit limits, if applicable, will still be applied. Contact the health plan using the number on the back of the member's ID card if you have any questions regarding coverage or Pre-Authorization requirements." ?

## 2021-04-16 DIAGNOSIS — E559 Vitamin D deficiency, unspecified: Secondary | ICD-10-CM | POA: Diagnosis not present

## 2021-04-16 DIAGNOSIS — E538 Deficiency of other specified B group vitamins: Secondary | ICD-10-CM | POA: Diagnosis not present

## 2021-04-16 DIAGNOSIS — Z0000 Encounter for general adult medical examination without abnormal findings: Secondary | ICD-10-CM | POA: Diagnosis not present

## 2021-04-16 DIAGNOSIS — E663 Overweight: Secondary | ICD-10-CM | POA: Diagnosis not present

## 2021-04-16 DIAGNOSIS — Z6828 Body mass index (BMI) 28.0-28.9, adult: Secondary | ICD-10-CM | POA: Diagnosis not present

## 2021-04-16 DIAGNOSIS — M5030 Other cervical disc degeneration, unspecified cervical region: Secondary | ICD-10-CM | POA: Diagnosis not present

## 2021-04-16 DIAGNOSIS — E2740 Unspecified adrenocortical insufficiency: Secondary | ICD-10-CM | POA: Diagnosis not present

## 2021-04-16 DIAGNOSIS — E2749 Other adrenocortical insufficiency: Secondary | ICD-10-CM | POA: Diagnosis not present

## 2021-04-17 LAB — COMPREHENSIVE METABOLIC PANEL
ALT: 15 IU/L (ref 0–32)
AST: 14 IU/L (ref 0–40)
Albumin/Globulin Ratio: 1.8 (ref 1.2–2.2)
Albumin: 4.7 g/dL (ref 3.8–4.8)
Alkaline Phosphatase: 107 IU/L (ref 44–121)
BUN/Creatinine Ratio: 11 — ABNORMAL LOW (ref 12–28)
BUN: 11 mg/dL (ref 8–27)
Bilirubin Total: 0.6 mg/dL (ref 0.0–1.2)
CO2: 28 mmol/L (ref 20–29)
Calcium: 9.8 mg/dL (ref 8.7–10.3)
Chloride: 98 mmol/L (ref 96–106)
Creatinine, Ser: 0.99 mg/dL (ref 0.57–1.00)
Globulin, Total: 2.6 g/dL (ref 1.5–4.5)
Glucose: 106 mg/dL — ABNORMAL HIGH (ref 70–99)
Potassium: 4.2 mmol/L (ref 3.5–5.2)
Sodium: 141 mmol/L (ref 134–144)
Total Protein: 7.3 g/dL (ref 6.0–8.5)
eGFR: 63 mL/min/{1.73_m2} (ref >59)

## 2021-04-17 LAB — TSH: TSH: 1.02 u[IU]/mL (ref 0.450–4.500)

## 2021-04-17 LAB — T4, FREE: Free T4: 1.31 ng/dL (ref 0.82–1.77)

## 2021-04-28 ENCOUNTER — Other Ambulatory Visit (HOSPITAL_COMMUNITY)
Admission: RE | Admit: 2021-04-28 | Discharge: 2021-04-28 | Disposition: A | Discharge status: Home / Self Care | Payer: BC Managed Care – PPO | Source: Intra-hospital | Attending: Internal Medicine | Admitting: Internal Medicine

## 2021-04-28 ENCOUNTER — Other Ambulatory Visit: Payer: Self-pay | Admitting: Gastroenterology

## 2021-04-28 DIAGNOSIS — K2100 Gastro-esophageal reflux disease with esophagitis, without bleeding: Secondary | ICD-10-CM | POA: Diagnosis not present

## 2021-04-28 DIAGNOSIS — K529 Noninfective gastroenteritis and colitis, unspecified: Secondary | ICD-10-CM | POA: Diagnosis not present

## 2021-04-28 LAB — BASIC METABOLIC PANEL
Anion gap: 6 (ref 5–15)
BUN: 15 mg/dL (ref 8–23)
CO2: 29 mmol/L (ref 22–32)
Calcium: 8.6 mg/dL — ABNORMAL LOW (ref 8.9–10.3)
Chloride: 102 mmol/L (ref 98–111)
Creatinine, Ser: 1.02 mg/dL — ABNORMAL HIGH (ref 0.44–1.00)
GFR, Estimated: >60 mL/min (ref >60)
Glucose, Bld: 116 mg/dL — ABNORMAL HIGH (ref 70–99)
Potassium: 3.5 mmol/L (ref 3.5–5.1)
Sodium: 137 mmol/L (ref 135–145)

## 2021-04-28 NOTE — Telephone Encounter (Signed)
Last ov 02/18/21 ?

## 2021-05-03 ENCOUNTER — Encounter (HOSPITAL_COMMUNITY): Payer: Self-pay | Admitting: Anesthesiology

## 2021-05-03 DIAGNOSIS — G4730 Sleep apnea, unspecified: Secondary | ICD-10-CM | POA: Diagnosis not present

## 2021-05-04 ENCOUNTER — Telehealth: Payer: Self-pay | Admitting: *Deleted

## 2021-05-04 DIAGNOSIS — K529 Noninfective gastroenteritis and colitis, unspecified: Secondary | ICD-10-CM

## 2021-05-04 DIAGNOSIS — R1310 Dysphagia, unspecified: Secondary | ICD-10-CM

## 2021-05-04 DIAGNOSIS — R1030 Lower abdominal pain, unspecified: Secondary | ICD-10-CM

## 2021-05-04 NOTE — Telephone Encounter (Signed)
Called pt, she is not in front of her calendar and will call back to schedule ?

## 2021-05-04 NOTE — Telephone Encounter (Signed)
Received message from endo patient on for procedure today and needs to r/s. Spouse in hospital. Will call pt to reschedule. ? ?Called pt, LMOVM we will call in the future to r/s. ?

## 2021-05-06 NOTE — Addendum Note (Signed)
Addended by: Hassan Rowan on: 05/06/2021 08:05 AM ? ? Modules accepted: Orders ? ?

## 2021-05-06 NOTE — Telephone Encounter (Signed)
Spoke to pt, TCS/EGD/DIL ASA 2 w/Dr. Gala Romney rescheduled to 06/03/21 at 10:30am. Will need BMET prior to procedure. New instructions mailed with lab letter. Endo scheduler informed.  ?

## 2021-05-18 ENCOUNTER — Other Ambulatory Visit (HOSPITAL_COMMUNITY): Payer: Self-pay | Admitting: Internal Medicine

## 2021-05-18 DIAGNOSIS — G4733 Obstructive sleep apnea (adult) (pediatric): Secondary | ICD-10-CM | POA: Diagnosis not present

## 2021-05-18 DIAGNOSIS — M549 Dorsalgia, unspecified: Secondary | ICD-10-CM

## 2021-05-21 ENCOUNTER — Ambulatory Visit: Payer: BC Managed Care – PPO | Admitting: "Endocrinology

## 2021-05-25 ENCOUNTER — Ambulatory Visit (HOSPITAL_COMMUNITY)
Admission: RE | Admit: 2021-05-25 | Discharge: 2021-05-25 | Disposition: A | Discharge status: Home / Self Care | Payer: BC Managed Care – PPO | Source: Ambulatory Visit | Attending: Internal Medicine | Admitting: Internal Medicine

## 2021-05-25 ENCOUNTER — Other Ambulatory Visit (HOSPITAL_COMMUNITY): Payer: Self-pay | Admitting: Internal Medicine

## 2021-05-25 DIAGNOSIS — M545 Low back pain, unspecified: Secondary | ICD-10-CM | POA: Diagnosis not present

## 2021-05-25 DIAGNOSIS — M549 Dorsalgia, unspecified: Secondary | ICD-10-CM | POA: Insufficient documentation

## 2021-06-01 ENCOUNTER — Other Ambulatory Visit (HOSPITAL_COMMUNITY)
Admission: RE | Admit: 2021-06-01 | Discharge: 2021-06-01 | Disposition: A | Discharge status: Home / Self Care | Payer: BC Managed Care – PPO | Source: Ambulatory Visit | Attending: Internal Medicine | Admitting: Internal Medicine

## 2021-06-01 ENCOUNTER — Other Ambulatory Visit: Payer: Self-pay

## 2021-06-01 DIAGNOSIS — R1310 Dysphagia, unspecified: Secondary | ICD-10-CM | POA: Insufficient documentation

## 2021-06-01 DIAGNOSIS — R1030 Lower abdominal pain, unspecified: Secondary | ICD-10-CM | POA: Insufficient documentation

## 2021-06-01 DIAGNOSIS — K529 Noninfective gastroenteritis and colitis, unspecified: Secondary | ICD-10-CM | POA: Diagnosis not present

## 2021-06-01 LAB — BASIC METABOLIC PANEL
Anion gap: 8 (ref 5–15)
BUN: 20 mg/dL (ref 8–23)
CO2: 31 mmol/L (ref 22–32)
Calcium: 8.9 mg/dL (ref 8.9–10.3)
Chloride: 99 mmol/L (ref 98–111)
Creatinine, Ser: 1.08 mg/dL — ABNORMAL HIGH (ref 0.44–1.00)
GFR, Estimated: 57 mL/min — ABNORMAL LOW (ref >60)
Glucose, Bld: 117 mg/dL — ABNORMAL HIGH (ref 70–99)
Potassium: 3 mmol/L — ABNORMAL LOW (ref 3.5–5.1)
Sodium: 138 mmol/L (ref 135–145)

## 2021-06-01 MED ORDER — POTASSIUM CHLORIDE CRYS ER 20 MEQ PO TBCR: 20.0000 meq | EXTENDED_RELEASE_TABLET | Freq: Every day | ORAL | 0 refills | Status: DC

## 2021-06-03 ENCOUNTER — Ambulatory Visit (HOSPITAL_COMMUNITY)
Admission: RE | Admit: 2021-06-03 | Discharge: 2021-06-03 | Disposition: A | Discharge status: Home / Self Care | Payer: BC Managed Care – PPO | Attending: Internal Medicine | Admitting: Internal Medicine

## 2021-06-03 ENCOUNTER — Encounter (HOSPITAL_COMMUNITY)
Admission: RE | Disposition: A | Discharge status: Home / Self Care | Payer: Self-pay | Source: Home / Self Care | Attending: Internal Medicine

## 2021-06-03 ENCOUNTER — Encounter (HOSPITAL_COMMUNITY): Payer: Self-pay | Admitting: Internal Medicine

## 2021-06-03 ENCOUNTER — Other Ambulatory Visit: Payer: Self-pay

## 2021-06-03 ENCOUNTER — Ambulatory Visit (HOSPITAL_COMMUNITY): Payer: BC Managed Care – PPO | Admitting: Anesthesiology

## 2021-06-03 DIAGNOSIS — R1310 Dysphagia, unspecified: Secondary | ICD-10-CM | POA: Diagnosis not present

## 2021-06-03 DIAGNOSIS — I10 Essential (primary) hypertension: Secondary | ICD-10-CM | POA: Diagnosis not present

## 2021-06-03 DIAGNOSIS — G4730 Sleep apnea, unspecified: Secondary | ICD-10-CM | POA: Diagnosis not present

## 2021-06-03 DIAGNOSIS — Z79899 Other long term (current) drug therapy: Secondary | ICD-10-CM | POA: Insufficient documentation

## 2021-06-03 DIAGNOSIS — D125 Benign neoplasm of sigmoid colon: Secondary | ICD-10-CM | POA: Diagnosis not present

## 2021-06-03 DIAGNOSIS — F419 Anxiety disorder, unspecified: Secondary | ICD-10-CM | POA: Diagnosis not present

## 2021-06-03 DIAGNOSIS — K635 Polyp of colon: Secondary | ICD-10-CM | POA: Diagnosis not present

## 2021-06-03 DIAGNOSIS — Z87891 Personal history of nicotine dependence: Secondary | ICD-10-CM | POA: Insufficient documentation

## 2021-06-03 DIAGNOSIS — R1314 Dysphagia, pharyngoesophageal phase: Secondary | ICD-10-CM | POA: Diagnosis not present

## 2021-06-03 DIAGNOSIS — K921 Melena: Secondary | ICD-10-CM | POA: Insufficient documentation

## 2021-06-03 DIAGNOSIS — K2950 Unspecified chronic gastritis without bleeding: Secondary | ICD-10-CM | POA: Diagnosis not present

## 2021-06-03 DIAGNOSIS — K529 Noninfective gastroenteritis and colitis, unspecified: Secondary | ICD-10-CM | POA: Insufficient documentation

## 2021-06-03 DIAGNOSIS — K219 Gastro-esophageal reflux disease without esophagitis: Secondary | ICD-10-CM | POA: Insufficient documentation

## 2021-06-03 DIAGNOSIS — K449 Diaphragmatic hernia without obstruction or gangrene: Secondary | ICD-10-CM | POA: Insufficient documentation

## 2021-06-03 HISTORY — PX: POLYPECTOMY: SHX5525

## 2021-06-03 HISTORY — PX: BIOPSY: SHX5522

## 2021-06-03 HISTORY — PX: COLONOSCOPY WITH PROPOFOL: SHX5780

## 2021-06-03 HISTORY — PX: ESOPHAGOGASTRODUODENOSCOPY (EGD) WITH PROPOFOL: SHX5813

## 2021-06-03 HISTORY — PX: MALONEY DILATION: SHX5535

## 2021-06-03 SURGERY — COLONOSCOPY WITH PROPOFOL
Anesthesia: General

## 2021-06-03 MED ORDER — LACTATED RINGERS IV SOLN: INTRAVENOUS | Status: DC

## 2021-06-03 MED ORDER — PROPOFOL 10 MG/ML IV BOLUS
INTRAVENOUS | Status: DC | PRN
  Administered 2021-06-03: 40 mg via INTRAVENOUS
  Administered 2021-06-03: 100 mg via INTRAVENOUS

## 2021-06-03 MED ORDER — PROPOFOL 500 MG/50ML IV EMUL
INTRAVENOUS | Status: DC | PRN
Start: 2021-06-03 — End: 2021-06-03
  Administered 2021-06-03: 150 ug/kg/min via INTRAVENOUS

## 2021-06-03 MED ORDER — LIDOCAINE HCL (CARDIAC) PF 100 MG/5ML IV SOSY
PREFILLED_SYRINGE | INTRAVENOUS | Status: DC | PRN
  Administered 2021-06-03: 50 mg via INTRAVENOUS

## 2021-06-03 NOTE — H&P (Signed)
$'@LOGO'I$ @ ? ? ?Primary Care Physician:  Redmond School, MD ?Primary Gastroenterologist:  Dr. Gala Romney ? ?Pre-Procedure History & Physical: ?HPI:  Laura [name] is a 66 y.o. female here for  further evaluation of dysphagia in setting of longstanding GERD.  Intermittent rectal bleeding and chronic diarrhea.  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 ?  Well-controlled on rabeprazole twice daily ? ?Past Medical History:  ?Diagnosis Date  ? Acromioclavicular joint arthritis   ? left shoulder  ? Anxiety   ? Cancer [facility])   ? skin (facial)   ? GERD (gastroesophageal reflux disease)   ? H/O hiatal hernia   ? Hepatitis B antibody positive   ? letter from red cross, Hep B core total ab positive  ? History of melanoma excision   ? 01-05-2014  ? Hypertension   ? IBS (irritable bowel syndrome)   ? Nocturnal hypoxia   ? OA (osteoarthritis) of knee   ? RIGHT  ? RLS (restless legs syndrome)   ? Rotator cuff impingement syndrome of left shoulder   ? Shortness of breath dyspnea   ? Unspecified hereditary and idiopathic peripheral neuropathy   ? ? ?Past Surgical History:  ?Procedure Laterality Date  ? ABDOMINAL HYSTERECTOMY    ? ANTERIOR CERVICAL DECOMP/DISCECTOMY FUSION  2000  ? C4 -- C5  ? BIOPSY  08/11/2015  ? Procedure: BIOPSY;  Surgeon: Daneil Dolin, MD;  [facility];  Service: Endoscopy;;  gastric polyp;  ? cardiac cath    ? unremarkable  ? CARPAL TUNNEL RELEASE Right 1995  ? CHOLECYSTECTOMY  1996  ? COLONOSCOPY  07/2007  ? Dr. Gala Romney: Terminal ileum normal. Status post segmental colonic mucosal biopsy.  ? COLONOSCOPY WITH PROPOFOL N/A 08/11/2015  ? normal colonoscopy with benign colonic mucosa  ? ESOPHAGOGASTRODUODENOSCOPY  07/2007  ? Dr. Gala Romney: Small hiatal hernia, duodenal biopsy unremarkable. Fundic gland gastric polyps.  ? ESOPHAGOGASTRODUODENOSCOPY (EGD) WITH PROPOFOL N/A 08/11/2015  ? LA Grade A esophagitis, small hiatal hernia, few gastric polyps, normal duodenum (benign fundic gland polyp)  ? KNEE ARTHROSCOPY WITH MEDIAL MENISECTOMY  Right 05/25/2013  ? Procedure: RIGHT KNEE ARTHROSCOPY WITH PARTIAL LATERAL and MEDIAL MENISECTOMY AND CHONDROPLASTY;  Surgeon: Sydnee Cabal, MD;  Location: [place];  Service: Orthopedics;  Laterality: Right;  ? [place] SURGERY  08-22-2003  ? LEFT  L5  --  S1  ? MOHS SURGERY  01-05-2014  ? LEFT SIDE OF FACE  ? SHOULDER ARTHROSCOPY WITH SUBACROMIAL DECOMPRESSION Left 01/29/2014  ? Procedure: LEFT SHOULDER ARTHROSCOPY WITH SUBACROMIAL DECOMPRESSION/DISTAL CLAVICLE RESECTION AND DEBRIDEMENT;  Surgeon: Sydnee Cabal, MD;  Location: [place];  Service: Orthopedics;  Laterality: Left;  ? TOTAL ABDOMINAL HYSTERECTOMY W/ BILATERAL SALPINGOOPHORECTOMY  1999  ? ? ?Prior to Admission medications   ?Medication Sig Start Date End Date Taking? Authorizing Provider  ?ALPRAZolam (XANAX) 0.5 MG tablet Take 0.5 mg by mouth 3 (three) times daily as needed for anxiety. 05/01/16  Yes [provider]  ?Cyanocobalamin (VITAMIN B-12 IJ) Inject as directed every 30 (thirty) days.   Yes [provider]  ?hydrochlorothiazide (MICROZIDE) 12.5 MG capsule Take 12.5 mg by mouth every morning.  07/23/12  Yes [provider]  ?HYDROcodone-acetaminophen (NORCO) 10-325 MG tablet Take 0.5-1 tablets by mouth every 6 (six) hours as needed for severe pain. 02/19/20  Yes [provider]  ?hydrocortisone (CORTEF) 5 MG tablet Take 10 mg by mouth 2 (two) times daily. 02/11/21  Yes [provider]  ?Multiple Vitamin (MULTIVITAMIN WITH MINERALS) TABS  tablet Take 1 tablet by mouth daily.   Yes [provider]  ?Omega-3 Fatty Acids (FISH OIL PO) Take 2 capsules by mouth in the morning and at bedtime.   Yes [provider]  ?potassium chloride SA (KLOR-CON M) 20 MEQ tablet Take 1 tablet (20 mEq total) by mouth daily. 06/01/21  Yes Yasemin Rabon, Cristopher Estimable, MD  ?RABEprazole (ACIPHEX) 20 MG tablet TAKE 1 TABLET BY MOUTH TWICE DAILY 04/28/21  Yes Annitta Needs, NP  ?rOPINIRole  (REQUIP) 0.5 MG tablet Take 0.5 mg by mouth at bedtime. 02/11/20  Yes [provider]  ?traZODone (DESYREL) 150 MG tablet Take 150 mg by mouth at bedtime. 03/19/19  Yes [provider]  ?ursodiol (ACTIGALL) 500 MG tablet TAKE 1 TABLET BY MOUTH TWICE DAILY 11/22/19  Yes Annitta Needs, NP  ? ? ?Allergies as of 04/09/2021 - Review Complete 02/18/2021  ?Allergen Reaction Noted  ? Aspirin Other (See Comments) 06/16/2015  ? Celebrex [celecoxib] Hives 07/27/2012  ? Venlafaxine  02/16/2021  ? ? ?Family History  ?Problem Relation Age of Onset  ? Heart attack Mother   ? Lung cancer Mother   ? HIV/AIDS Father   ? Sjogren's syndrome Sister   ? Diabetes Brother   ? Breast cancer Maternal Aunt   ? Heart attack Daughter   ? Drug abuse Son   ? ADD / ADHD Grandchild   ? Crohn's disease Cousin   ? Colon cancer Neg Hx   ? Colon polyps Neg Hx   ? ? ?Social History  ? ?Socioeconomic History  ? Marital status: Married  ?  Spouse name: [name]  ? Number of children: 3  ? Years of education: GED  ? Highest education level: Not on file  ?Occupational History  ?  Comment: not employed  ?Tobacco Use  ? Smoking status: Former  ?  Packs/day: 1.00  ?  Years: 20.00  ?  Pack years: 20.00  ?  Types: Cigarettes  ?  Quit date: 11/2020  ?  Years since quitting: 0.5  ? Smokeless tobacco: Never  ? Tobacco comments:  ?  smokes 3 cigarettes per day-- 02/29/2020  ?Vaping Use  ? Vaping Use: Never used  ?Substance and Sexual Activity  ? Alcohol use: Yes  ?  Comment: rare  ? Drug use: No  ? Sexual activity: Not on file  ?Other Topics Concern  ? Not on file  ?Social History Narrative  ? Patient lives at home with her husband [name]).   ? Patient is not working as time but she trying for disability.  ? Education 10th grade  ? Caffeine daily coffee and mountain dew . One can daily.  ? .  ?   ? ?Social Determinants of Health  ? ?Financial Resource Strain: Not on file  ?Food Insecurity: Not on file  ?Transportation Needs: Not on file  ?Physical  Activity: Not on file  ?Stress: Not on file  ?Social Connections: Not on file  ?Intimate Partner Violence: Not on file  ? ? ?Review of Systems: ?See HPI, otherwise negative ROS ? ?Physical Exam: ?BP (!) 161/97   Pulse 84   Temp 97.8 ?F (36.6 ?C) (Oral)   Resp 14   Ht '5\' 1"'$  (1.549 m)   Wt 72.1 kg   SpO2 99%   BMI 30.04 kg/m?  ?General:   Alert,  Well-developed, well-nourished, pleasant and cooperative in NAD ?Neck:  Supple; no masses or thyromegaly. No significant cervical adenopathy. ?Lungs:  Clear throughout to auscultation.  No wheezes, crackles, or rhonchi. No acute distress. ?Heart:  Regular rate and rhythm; no murmurs, clicks, rubs,  or gallops. ?Abdomen: Non-distended, normal bowel sounds.  Soft and nontender without appreciable mass or hepatosplenomegaly.  ?Pulses:  Normal pulses noted. ?Extremities:  Without clubbing or edema. ? ?Impression/Plan:   66 year old lady with longstanding GERD with intermittent esophageal dysphagia solid food.  Intermittent paper hematochezia in the setting of intermittent diarrhea. ? ?Recommendations: I have offered the patient an EGD with esophageal dilation as feasible/appropriate per plan as well as a diagnostic colonoscopy.  Risk, benefits, limitations, imponderables and alternatives have been reviewed.  Questions answered.  She is agreeable. ? ?Further recommendations to follow after endoscopic evaluation has been carried out. ? ? ? ? ?Notice: This dictation was prepared with Dragon dictation along with smaller phrase technology. Any transcriptional errors that result from this process are unintentional and may not be corrected upon review.  ? ?

## 2021-06-03 NOTE — Transfer of Care (Signed)
Immediate Anesthesia Transfer of Care Note ? ?Patient: Laura [name] ? ?Procedure(s) Performed: COLONOSCOPY WITH PROPOFOL ?ESOPHAGOGASTRODUODENOSCOPY (EGD) WITH PROPOFOL ?MALONEY DILATION ?BIOPSY ?POLYPECTOMY ? ?Patient Location: Endoscopy Unit ? ?Anesthesia Type:General ? ?Level of Consciousness: awake ? ?Airway & Oxygen Therapy: Patient Spontanous Breathing ? ?Post-op Assessment: Report given to RN and Post -op Vital signs reviewed and stable ? ?Post vital signs: Reviewed and stable ? ?Last Vitals:  ?Vitals Value Taken Time  ?BP    ?Temp    ?Pulse    ?Resp    ?SpO2    ? ? ?Last Pain:  ?Vitals:  ? 06/03/21 0920  ?TempSrc: Oral  ?PainSc: 0-No pain  ?   ? ?Patients Stated Pain Goal: 6 (06/03/21 0920) ? ?Complications: No notable events documented. ?

## 2021-06-03 NOTE — Anesthesia Preprocedure Evaluation (Signed)
Anesthesia Evaluation  ?Patient identified by MRN, date of birth, ID band ?Patient awake ? ? ? ?Reviewed: ?Allergy & Precautions, NPO status , Patient's Chart, lab work & pertinent test results ? ?Airway ?Mallampati: II ? ?TM Distance: >3 FB ?Neck ROM: Full ? ? ? Dental ? ?(+) Dental Advisory Given, Teeth Intact ?  ?Pulmonary ?shortness of breath and with exertion, sleep apnea , former smoker,  ?  ?Pulmonary exam normal ?breath sounds clear to auscultation ? ? ? ? ? ? Cardiovascular ?Exercise Tolerance: Good ?hypertension, Pt. on medications ?Normal cardiovascular exam ?Rhythm:Regular Rate:Normal ? ? ?  ?Neuro/Psych ?PSYCHIATRIC DISORDERS Anxiety  Neuromuscular disease   ? GI/Hepatic ?hiatal hernia, GERD  Medicated,(+) Hepatitis -  ?Endo/Other  ?Adrenal insufficiency  ? Renal/GU ?negative Renal ROS  ?negative genitourinary ?  ?Musculoskeletal ? ?(+) Arthritis ,  ? Abdominal ?  ?Peds ?negative pediatric ROS ?(+)  Hematology ?negative hematology ROS ?(+)   ?Anesthesia Other Findings ?On hydrocortisone for adrenal insufficiency  ? Reproductive/Obstetrics ?negative OB ROS ? ?  ? ? ? ? ? ? ? ? ? ? ? ? ? ?  ?  ? ? ? ? ? ? ? ?Anesthesia Physical ?Anesthesia Plan ? ?ASA: 3 ? ?Anesthesia Plan: General  ? ?Post-op Pain Management: Minimal or no pain anticipated  ? ?Induction: Intravenous ? ?PONV Risk Score and Plan: TIVA ? ?Airway Management Planned: Nasal Cannula and Natural Airway ? ?Additional Equipment:  ? ?Intra-op Plan:  ? ?Post-operative Plan:  ? ?Informed Consent: I have reviewed the patients History and Physical, chart, labs and discussed the procedure including the risks, benefits and alternatives for the proposed anesthesia with the patient or authorized representative who has indicated his/her understanding and acceptance.  ? ? ? ?Dental advisory given ? ?Plan Discussed with: CRNA and Surgeon ? ?Anesthesia Plan Comments:   ? ? ? ? ? ?Anesthesia Quick Evaluation ? ?

## 2021-06-03 NOTE — Op Note (Signed)
[facility] ?Patient Name: Laura [name] ?Procedure Date: 06/03/2021 9:37 AM ?MRN: [id number] ?Date of Birth: 25-Feb-1955 ?Attending MD: Gennette Pac , MD ?CSN: [id number] ?Age: 66 ?Admit Type: Outpatient ?Procedure:                Upper GI endoscopy ?Indications:              Dysphagia ?Providers:                Gennette Pac, MD, Jannett Celestine, RN, Vonna Kotyk  ?                          Durene Romans, Technician ?Referring MD:              ?Medicines:                Propofol per Anesthesia ?Complications:            No immediate complications. ?Estimated Blood Loss:     Estimated blood loss was minimal. ?Procedure:                Pre-Anesthesia Assessment: ?                          - Prior to the procedure, a History and Physical  ?                          was performed, and patient medications and  ?                          allergies were reviewed. The patient's tolerance of  ?                          previous anesthesia was also reviewed. The risks  ?                          and benefits of the procedure and the sedation  ?                          options and risks were discussed with the patient.  ?                          All questions were answered, and informed consent  ?                          was obtained. Prior Anticoagulants: The patient has  ?                          taken no previous anticoagulant or antiplatelet  ?                          agents. After reviewing the risks and benefits, the  ?                          patient was deemed in satisfactory condition to  ?  undergo the procedure. ?                          After obtaining informed consent, the endoscope was  ?                          passed under direct vision. Throughout the  ?                          procedure, the patient's blood pressure, pulse, and  ?                          oxygen saturations were monitored continuously. The  ?                          GIF-H190 ([id number]) scope was  introduced through the  ?                          mouth, and advanced to the second part of duodenum.  ?                          The upper GI endoscopy was accomplished without  ?                          difficulty. The patient tolerated the procedure  ?                          well. ?Scope In: 10:08:09 AM ?Scope Out: 10:13:49 AM ?Total Procedure Duration: 0 hours 5 minutes 40 seconds  ?Findings: ?     The examined esophagus was normal. ?     A small hiatal hernia was present. Mottling and minimal for scale  ?     appearance of the gastric mucosa diffusely. No ulcer or infiltrating  ?     process seen. Patent pylorus.The scope was withdrawn. Dilation was  ?     performed with a Maloney dilator with mild resistance at 54 Fr. The  ?     dilation site was examined following endoscope reinsertion and showed no  ?     change. Estimated blood loss: none. Finally, biopsies of the gastric  ?     antral and body mucosa were taken for histologic study. ?     The duodenal bulb and second portion of the duodenum were normal. ?Impression:               Abnormal gastric mucosa as described???status post  ?                          biopsy ?                          - Normal esophagus. Dilated. ?                          - Small hiatal hernia. ?                          - Normal duodenal bulb and second  portion of the  ?                          duodenum. ?                          - ?Moderate Sedation: ?     Moderate (conscious) sedation was personally administered by an  ?     anesthesia professional. The following parameters were monitored: oxygen  ?     saturation, heart rate, blood pressure, respiratory rate, EKG, adequacy  ?     of pulmonary ventilation, and response to care. ?Recommendation:           - Patient has a contact number available for  ?                          emergencies. The signs and symptoms of potential  ?                          delayed complications were discussed with the  ?                           patient. Return to normal activities tomorrow.  ?                          Written discharge instructions were provided to the  ?                          patient. ?                          - Advance diet as tolerated. ?                          - Continue present medications. Follow-up on  ?                          pathology. ?                          - Return to my office in 3 months. See colonoscopy  ?                          report. ?Procedure Code(s):        --- Professional --- ?                          [id number], Esophagogastroduodenoscopy, flexible,  ?                          transoral; diagnostic, including collection of  ?                          specimen(s) by brushing or washing, when performed  ?                          (separate procedure) ?  43450, Dilation of esophagus, by unguided sound or  ?                          bougie, single or multiple passes ?Diagnosis Code(s):        --- Professional --- ?                          K44.9, Diaphragmatic hernia without obstruction or  ?                          gangrene ?                          R13.10, Dysphagia, unspecified ?CPT copyright 2019 American Medical Association. All rights reserved. ?The codes documented in this report are preliminary and upon coder review may  ?be revised to meet current compliance requirements. ?Gerrit Friends. Mahesh Sizemore, MD ?Gennette Pac, MD ?06/03/2021 10:18:42 AM ?This report has been signed electronically. ?Number of Addenda: 0 ?

## 2021-06-03 NOTE — Op Note (Signed)
[facility] ?Patient Name: Laura [name] ?Procedure Date: 06/03/2021 9:21 AM ?MRN: [id number] ?Date of Birth: [birth year] ?Attending MD: Gennette Pac , MD ?CSN: [id number] ?Age: 66 ?Admit Type: Outpatient ?Procedure:                Colonoscopy ?Indications:              Hematochezia; diarrhea ?Providers:                Gennette Pac, MD, Jannett Celestine, RN, Vonna Kotyk  ?                          Durene Romans, Technician ?Referring MD:              ?Medicines:                Propofol per Anesthesia ?Complications:            No immediate complications. ?Estimated Blood Loss:     Estimated blood loss was minimal. ?Procedure:                Pre-Anesthesia Assessment: ?                          - Prior to the procedure, a History and Physical  ?                          was performed, and patient medications and  ?                          allergies were reviewed. The patient's tolerance of  ?                          previous anesthesia was also reviewed. The risks  ?                          and benefits of the procedure and the sedation  ?                          options and risks were discussed with the patient.  ?                          All questions were answered, and informed consent  ?                          was obtained. Prior Anticoagulants: The patient has  ?                          taken no previous anticoagulant or antiplatelet  ?                          agents. ASA Grade Assessment: II - A patient with  ?                          mild systemic disease. After reviewing the risks  ?  and benefits, the patient was deemed in  ?                          satisfactory condition to undergo the procedure. ?                          After obtaining informed consent, the colonoscope  ?                          was passed under direct vision. Throughout the  ?                          procedure, the patient's blood pressure, pulse, and  ?                          oxygen  saturations were monitored continuously. The  ?                          [id number]) scope was introduced through the  ?                          anus and advanced to the 10 cm into the ileum. The  ?                          colonoscopy was performed without difficulty. The  ?                          patient tolerated the procedure well. The quality  ?                          of the bowel preparation was adequate. ?Scope In: 10:22:19 AM ?Scope Out: 10:38:34 AM ?Scope Withdrawal Time: 0 hours 11 minutes 38 seconds  ?Total Procedure Duration: 0 hours 16 minutes 15 seconds  ?Findings: ?     The perianal and digital rectal examinations were normal. ?     A 4 mm polyp was found in the sigmoid colon. The polyp was sessile. The  ?     polyp was removed with a cold snare. Resection and retrieval were  ?     complete. Estimated blood loss was minimal. ?     The exam was otherwise without abnormality on direct and retroflexion  ?     views. Distal 10 cm of TI appeared normal. Segmental biopsy of the right  ?     and left colon taken for histologic study. ?Impression:               - One 4 mm polyp in the sigmoid colon, removed with  ?                          a cold snare. Resected and retrieved. ?                          - The examination was otherwise normal on direct  ?  and retroflexion views. Normal-appearing TI.  ?                          Segmental biopsies taken. ?Moderate Sedation: ?     Moderate (conscious) sedation was personally administered by an  ?     anesthesia professional. The following parameters were monitored: oxygen  ?     saturation, heart rate, blood pressure, respiratory rate, EKG, adequacy  ?     of pulmonary ventilation, and response to care. ?Recommendation:           - Patient has a contact number available for  ?                          emergencies. The signs and symptoms of potential  ?                          delayed complications were discussed with the  ?                           patient. Return to normal activities tomorrow.  ?                          Written discharge instructions were provided to the  ?                          patient. ?                          - Resume previous diet. ?                          - Continue present medications. ?                          - Repeat colonoscopy date to be determined after  ?                          pending pathology results are reviewed for  ?                          surveillance. ?                          - Return to GI office in 2 months. Follow-up on  ?                          pathology. See EGD report ?Procedure Code(s):        --- Professional --- ?                          [phone], Colonoscopy, flexible; with removal of  ?                          tumor(s), polyp(s), or other lesion(s) by snare  ?  technique ?Diagnosis Code(s):        --- Professional --- ?                          K63.5, Polyp of colon ?                          K92.1, Melena (includes Hematochezia) ?CPT copyright 2019 American Medical Association. All rights reserved. ?The codes documented in this report are preliminary and upon coder review may  ?be revised to meet current compliance requirements. ?Gerrit Friends. Georgenia Salim, MD ?Gennette Pac, MD ?06/03/2021 10:43:13 AM ?This report has been signed electronically. ?Number of Addenda: 0 ?

## 2021-06-03 NOTE — Discharge Instructions (Addendum)
?Colonoscopy ?Discharge Instructions ? ?Read the instructions outlined below and refer to this sheet in the next few weeks. These discharge instructions provide you with general information on caring for yourself after you leave the hospital. Your doctor may also give you specific instructions. While your treatment has been planned according to the most current medical practices available, unavoidable complications occasionally occur. If you have any problems or questions after discharge, call Dr. Gala Romney at [phone]. ?ACTIVITY ?You may resume your regular activity, but move at a slower pace for the next 24 hours.  ?Take frequent rest periods for the next 24 hours.  ?Walking will help get rid of the air and reduce the bloated feeling in your belly (abdomen).  ?No driving for 24 hours (because of the medicine (anesthesia) used during the test).   ?Do not sign any important legal documents or operate any machinery for 24 hours (because of the anesthesia used during the test).  ?NUTRITION ?Drink plenty of fluids.  ?You may resume your normal diet as instructed by your doctor.  ?Begin with a light meal and progress to your normal diet. Heavy or fried foods are harder to digest and may make you feel sick to your stomach (nauseated).  ?Avoid alcoholic beverages for 24 hours or as instructed.  ?MEDICATIONS ?You may resume your normal medications unless your doctor tells you otherwise.  ?WHAT YOU CAN EXPECT TODAY ?Some feelings of bloating in the abdomen.  ?Passage of more gas than usual.  ?Spotting of blood in your stool or on the toilet paper.  ?IF YOU HAD POLYPS REMOVED DURING THE COLONOSCOPY: ?No aspirin products for 7 days or as instructed.  ?No alcohol for 7 days or as instructed.  ?Eat a soft diet for the next 24 hours.  ?FINDING OUT THE RESULTS OF YOUR TEST ?Not all test results are available during your visit. If your test results are not back during the visit, make an appointment with your caregiver to find out the  results. Do not assume everything is normal if you have not heard from your caregiver or the medical facility. It is important for you to follow up on all of your test results.  ?SEEK IMMEDIATE MEDICAL ATTENTION IF: ?You have more than a spotting of blood in your stool.  ?Your belly is swollen (abdominal distention).  ?You are nauseated or vomiting.  ?You have a temperature over 101.  ?You have abdominal pain or discomfort that is severe or gets worse throughout the day.   ?EGD ?Discharge instructions ?Please read the instructions outlined below and refer to this sheet in the next few weeks. These discharge instructions provide you with general information on caring for yourself after you leave the hospital. Your doctor may also give you specific instructions. While your treatment has been planned according to the most current medical practices available, unavoidable complications occasionally occur. If you have any problems or questions after discharge, please call your doctor. ?ACTIVITY ?You may resume your regular activity but move at a slower pace for the next 24 hours.  ?Take frequent rest periods for the next 24 hours.  ?Walking will help expel (get rid of) the air and reduce the bloated feeling in your abdomen.  ?No driving for 24 hours (because of the anesthesia (medicine) used during the test).  ?You may shower.  ?Do not sign any important legal documents or operate any machinery for 24 hours (because of the anesthesia used during the test).  ?NUTRITION ?Drink plenty of fluids.  ?You may  resume your normal diet.  ?Begin with a light meal and progress to your normal diet.  ?Avoid alcoholic beverages for 24 hours or as instructed by your caregiver.  ?MEDICATIONS ?You may resume your normal medications unless your caregiver tells you otherwise.  ?WHAT YOU CAN EXPECT TODAY ?You may experience abdominal discomfort such as a feeling of fullness or ?gas? pains.  ?FOLLOW-UP ?Your doctor will discuss the results of  your test with you.  ?SEEK IMMEDIATE MEDICAL ATTENTION IF ANY OF THE FOLLOWING OCCUR: ?Excessive nausea (feeling sick to your stomach) and/or vomiting.  ?Severe abdominal pain and distention (swelling).  ?Trouble swallowing.  ?Temperature over 101? F (37.8? C).  ?Rectal bleeding or vomiting of blood.   ? ? ?Your esophagus was stretched today.  Your stomach was mildly inflamed.  Biopsies taken. ? ?  1 small polyp removed from your colon.  Also colon biopsies taken. ? ?  No sign of Crohn's disease or inflammatory bowel disease ? ?Further recommendations to follow pending review of pathology report ? ?Office visit with [place] in 3 months ? ?At patient request, I called Laura [name] at [phone] results ?

## 2021-06-03 NOTE — Anesthesia Procedure Notes (Signed)
Date/Time: 06/03/2021 10:10 AM ?Performed by: Orlie Dakin, CRNA ?Pre-anesthesia Checklist: Patient identified, Emergency Drugs available, Suction available and Patient being monitored ?Patient Re-evaluated:Patient Re-evaluated prior to induction ?Induction Type: IV induction ?Placement Confirmation: positive ETCO2 ? ? ? ? ?

## 2021-06-04 ENCOUNTER — Encounter: Payer: Self-pay | Admitting: Internal Medicine

## 2021-06-04 LAB — SURGICAL PATHOLOGY

## 2021-06-04 NOTE — Anesthesia Postprocedure Evaluation (Signed)
Anesthesia Post Note ? ?Patient: Laura [name] ? ?Procedure(s) Performed: COLONOSCOPY WITH PROPOFOL ?ESOPHAGOGASTRODUODENOSCOPY (EGD) WITH PROPOFOL ?MALONEY DILATION ?BIOPSY ?POLYPECTOMY ? ?Patient location during evaluation: Endoscopy ?Anesthesia Type: General ?Level of consciousness: awake and alert and oriented ?Pain management: pain level controlled ?Vital Signs Assessment: post-procedure vital signs reviewed and stable ?Respiratory status: spontaneous breathing, nonlabored ventilation, respiratory function stable and patient connected to nasal cannula oxygen ?Cardiovascular status: blood pressure returned to baseline and stable ?Postop Assessment: no apparent nausea or vomiting ?Anesthetic complications: no ? ? ?No notable events documented. ? ? ?Last Vitals:  ?Vitals:  ? 06/03/21 0920 06/03/21 1041  ?BP: (!) 161/97 113/68  ?Pulse: 84   ?Resp: 14 20  ?Temp: 36.6 ?C 36.6 ?C  ?SpO2: 99% 98%  ?  ?Last Pain:  ?Vitals:  ? 06/03/21 1041  ?TempSrc: Oral  ?PainSc: 0-No pain  ? ? ?  ?  ?  ?  ?  ?  ? ?Leighla Chestnutt C Jaclynn Laumann ? ? ? ? ?

## 2021-06-08 DIAGNOSIS — G4733 Obstructive sleep apnea (adult) (pediatric): Secondary | ICD-10-CM | POA: Diagnosis not present

## 2021-06-11 ENCOUNTER — Encounter (HOSPITAL_COMMUNITY): Payer: Self-pay | Admitting: Internal Medicine

## 2021-06-17 DIAGNOSIS — Z0000 Encounter for general adult medical examination without abnormal findings: Secondary | ICD-10-CM | POA: Diagnosis not present

## 2021-06-17 DIAGNOSIS — E663 Overweight: Secondary | ICD-10-CM | POA: Diagnosis not present

## 2021-06-17 DIAGNOSIS — M549 Dorsalgia, unspecified: Secondary | ICD-10-CM | POA: Diagnosis not present

## 2021-06-17 DIAGNOSIS — E876 Hypokalemia: Secondary | ICD-10-CM | POA: Diagnosis not present

## 2021-06-17 DIAGNOSIS — G4733 Obstructive sleep apnea (adult) (pediatric): Secondary | ICD-10-CM | POA: Diagnosis not present

## 2021-06-17 DIAGNOSIS — M5030 Other cervical disc degeneration, unspecified cervical region: Secondary | ICD-10-CM | POA: Diagnosis not present

## 2021-06-17 DIAGNOSIS — E538 Deficiency of other specified B group vitamins: Secondary | ICD-10-CM | POA: Diagnosis not present

## 2021-06-17 DIAGNOSIS — G894 Chronic pain syndrome: Secondary | ICD-10-CM | POA: Diagnosis not present

## 2021-06-17 DIAGNOSIS — Z6827 Body mass index (BMI) 27.0-27.9, adult: Secondary | ICD-10-CM | POA: Diagnosis not present

## 2021-06-17 DIAGNOSIS — I10 Essential (primary) hypertension: Secondary | ICD-10-CM | POA: Diagnosis not present

## 2021-06-17 DIAGNOSIS — J340 Abscess, furuncle and carbuncle of nose: Secondary | ICD-10-CM | POA: Diagnosis not present

## 2021-06-17 DIAGNOSIS — J9611 Chronic respiratory failure with hypoxia: Secondary | ICD-10-CM | POA: Diagnosis not present

## 2021-06-17 DIAGNOSIS — F330 Major depressive disorder, recurrent, mild: Secondary | ICD-10-CM | POA: Diagnosis not present

## 2021-07-09 DIAGNOSIS — G4733 Obstructive sleep apnea (adult) (pediatric): Secondary | ICD-10-CM | POA: Diagnosis not present

## 2021-07-13 DIAGNOSIS — E538 Deficiency of other specified B group vitamins: Secondary | ICD-10-CM | POA: Diagnosis not present

## 2021-07-13 DIAGNOSIS — I10 Essential (primary) hypertension: Secondary | ICD-10-CM | POA: Diagnosis not present

## 2021-07-13 DIAGNOSIS — Z6828 Body mass index (BMI) 28.0-28.9, adult: Secondary | ICD-10-CM | POA: Diagnosis not present

## 2021-07-13 DIAGNOSIS — E559 Vitamin D deficiency, unspecified: Secondary | ICD-10-CM | POA: Diagnosis not present

## 2021-07-13 DIAGNOSIS — F419 Anxiety disorder, unspecified: Secondary | ICD-10-CM | POA: Diagnosis not present

## 2021-07-13 DIAGNOSIS — G894 Chronic pain syndrome: Secondary | ICD-10-CM | POA: Diagnosis not present

## 2021-07-13 DIAGNOSIS — E2749 Other adrenocortical insufficiency: Secondary | ICD-10-CM | POA: Diagnosis not present

## 2021-08-08 DIAGNOSIS — G4733 Obstructive sleep apnea (adult) (pediatric): Secondary | ICD-10-CM | POA: Diagnosis not present

## 2021-08-14 DIAGNOSIS — F419 Anxiety disorder, unspecified: Secondary | ICD-10-CM | POA: Diagnosis not present

## 2021-08-14 DIAGNOSIS — G894 Chronic pain syndrome: Secondary | ICD-10-CM | POA: Diagnosis not present

## 2021-08-14 DIAGNOSIS — E782 Mixed hyperlipidemia: Secondary | ICD-10-CM | POA: Diagnosis not present

## 2021-08-14 DIAGNOSIS — M5030 Other cervical disc degeneration, unspecified cervical region: Secondary | ICD-10-CM | POA: Diagnosis not present

## 2021-08-17 ENCOUNTER — Other Ambulatory Visit: Payer: Self-pay | Admitting: Internal Medicine

## 2021-08-17 ENCOUNTER — Other Ambulatory Visit (HOSPITAL_COMMUNITY): Payer: Self-pay | Admitting: Internal Medicine

## 2021-08-17 DIAGNOSIS — M549 Dorsalgia, unspecified: Secondary | ICD-10-CM

## 2021-08-25 ENCOUNTER — Encounter: Payer: Self-pay | Admitting: Gastroenterology

## 2021-08-25 ENCOUNTER — Ambulatory Visit: Payer: PPO | Admitting: Gastroenterology

## 2021-08-25 ENCOUNTER — Telehealth: Payer: Self-pay | Admitting: Gastroenterology

## 2021-08-25 VITALS — BP 144/86 | HR 83 | Temp 97.5°F | Ht 61.5 in | Wt 152.0 lb

## 2021-08-25 DIAGNOSIS — K743 Primary biliary cirrhosis: Secondary | ICD-10-CM

## 2021-08-25 DIAGNOSIS — K219 Gastro-esophageal reflux disease without esophagitis: Secondary | ICD-10-CM

## 2021-08-25 NOTE — Progress Notes (Signed)
Gastroenterology Office Note     Primary Care Physician:  Redmond School, MD  Primary Gastroenterologist:    Chief Complaint   Chief Complaint  Patient presents with   Follow-up    Pt following up     History of Present Illness   Laura [name] is a 66 y.o. female presenting today in follow-up with a history of GERD, chronic diarrhea, previously evaluated extensively and worked up for microscopic colitis and celiac disease. Ileocolonoscopy in July 2017 negative for microscopic colitis. Felt to have IBS-D.  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 Started on Urso and elastography with minimal fibrosis. [place] on file from May 2022 with fatty liver.  Underwent colonoscopy due to diarrhea in May 2023 with negative segmental biopsies and hyperplastic polyp. Screening due again in 10 years. EGD with normal esophagus s/p dilation, chronic gastritis, negative H.pylori.   PBC: recent labs reviewed, normal alk phos and transaminases. Vit D daily but not as often in the summer.   Does better with taking Aciphex in the morning, 40 mg. Worsened if breaking it up to BID.   Impacted one time. Dealing with constipation and diarrhea. Stool is no longer watery. Took Xifaxan in interim from last visit and noted improvement. Feels like symptoms are manageable. Feels like for most part does well and has a BM each morning.     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)    GERD (gastroesophageal reflux disease)    H/O hiatal hernia    Hepatitis B antibody positive    letter from red cross, Hep B core total ab positive   History of melanoma excision    01-05-2014   Hypertension    IBS (irritable bowel syndrome)    Nocturnal hypoxia    OA (osteoarthritis) of knee    RIGHT   RLS (restless legs syndrome)    Rotator cuff impingement syndrome of left  shoulder    Shortness of breath dyspnea    Unspecified hereditary and idiopathic peripheral neuropathy     Past Surgical History:  Procedure Laterality Date   ABDOMINAL HYSTERECTOMY     ANTERIOR CERVICAL DECOMP/DISCECTOMY FUSION  2000   C4 -- C5   BIOPSY  08/11/2015   Procedure: BIOPSY;  Surgeon: Daneil Dolin, MD;  [facility];  Service: Endoscopy;;  gastric polyp;   BIOPSY  06/03/2021   Procedure: BIOPSY;  Surgeon: Daneil Dolin, MD;  [facility];  Service: Endoscopy;;   cardiac cath     unremarkable   CARPAL TUNNEL RELEASE Right [place]  07/2007   Dr. Gala Romney: Terminal ileum normal. Status post segmental colonic mucosal biopsy.   COLONOSCOPY WITH PROPOFOL N/A 08/11/2015   normal colonoscopy with benign colonic mucosa   COLONOSCOPY WITH PROPOFOL N/A 06/03/2021   one 4 mm polyps in sigmoid, normal TI, segmental biopsies taken. Hyperplastic polyp. Benign colonic biopsies.   ESOPHAGOGASTRODUODENOSCOPY  07/2007   Dr. Gala Romney: Small hiatal hernia, duodenal biopsy unremarkable. Fundic gland gastric polyps.   ESOPHAGOGASTRODUODENOSCOPY (EGD) WITH PROPOFOL N/A 08/11/2015   LA Grade A esophagitis, small hiatal hernia, few gastric polyps, normal duodenum (benign fundic gland polyp)   ESOPHAGOGASTRODUODENOSCOPY (EGD) WITH PROPOFOL N/A 06/03/2021   normal esophagus s/p dilation, abnormal gastric mucosa,  small hiatal hernia, path with chronic gastritis, negative H.pylori.   KNEE ARTHROSCOPY WITH MEDIAL MENISECTOMY Right 05/25/2013   Procedure: RIGHT KNEE ARTHROSCOPY WITH PARTIAL LATERAL and MEDIAL MENISECTOMY AND CHONDROPLASTY;  Surgeon: Sydnee Cabal, MD;  Location: [facility];  Service: Orthopedics;  Laterality: Right;   LUMBAR DISC SURGERY  08/22/2003   LEFT  L5  --  S1   MALONEY DILATION N/A 06/03/2021   Procedure: Venia Minks DILATION;  Surgeon: Daneil Dolin, MD;  [facility];  Service: Endoscopy;   Laterality: N/A;   MOHS SURGERY  01/05/2014   LEFT SIDE OF FACE   POLYPECTOMY  06/03/2021   Procedure: POLYPECTOMY;  Surgeon: Daneil Dolin, MD;  [facility];  Service: Endoscopy;;   SHOULDER ARTHROSCOPY WITH SUBACROMIAL DECOMPRESSION Left 01/29/2014   Procedure: LEFT SHOULDER ARTHROSCOPY WITH SUBACROMIAL DECOMPRESSION/DISTAL CLAVICLE RESECTION AND DEBRIDEMENT;  Surgeon: Sydnee Cabal, MD;  Location: [facility];  Service: Orthopedics;  Laterality: Left;   TOTAL ABDOMINAL HYSTERECTOMY W/ BILATERAL SALPINGOOPHORECTOMY  1999    Current Outpatient Medications  Medication Sig Dispense Refill   ALPRAZolam (XANAX) 0.5 MG tablet Take 0.5 mg by mouth 3 (three) times daily as needed for anxiety.     Cyanocobalamin (VITAMIN B-12 IJ) Inject as directed every 30 (thirty) days.     hydrochlorothiazide (MICROZIDE) 12.5 MG capsule Take 12.5 mg by mouth every morning.      HYDROcodone-acetaminophen (NORCO) 10-325 MG tablet Take 0.5-1 tablets by mouth every 6 (six) hours as needed for severe pain.     hydrocortisone (CORTEF) 5 MG tablet Take 10 mg by mouth 2 (two) times daily.     Multiple Vitamin (MULTIVITAMIN WITH MINERALS) TABS tablet Take 1 tablet by mouth daily.     Omega-3 Fatty Acids (FISH OIL PO) Take 2 capsules by mouth in the morning and at bedtime.     RABEprazole (ACIPHEX) 20 MG tablet TAKE 1 TABLET BY MOUTH TWICE DAILY 180 tablet 3   rOPINIRole (REQUIP) 0.5 MG tablet Take 0.5 mg by mouth at bedtime.     traZODone (DESYREL) 150 MG tablet Take 150 mg by mouth at bedtime.     ursodiol (ACTIGALL) 500 MG tablet TAKE 1 TABLET BY MOUTH TWICE DAILY 60 tablet 5   No current facility-administered medications for this visit.    Allergies as of 08/25/2021 - Review Complete 08/25/2021  Allergen Reaction Noted   Aspirin Other (See Comments) 06/16/2015   Celebrex [celecoxib] Hives 07/27/2012   Effexor [venlafaxine] Other (See Comments) 02/16/2021    Family History   Problem Relation Age of Onset   Heart attack Mother    Lung cancer Mother    HIV/AIDS Father    Sjogren's syndrome Sister    Diabetes Brother    Breast cancer Maternal Aunt    Heart attack Daughter    Drug abuse Son    ADD / ADHD Grandchild    Crohn's disease Cousin    Colon cancer Neg Hx    Colon polyps Neg Hx     Social History   Socioeconomic History   Marital status: Married    Spouse name: [name]   Number of children: 3   Years of education: GED   Highest education level: Not on file  Occupational History    Comment: not employed  Tobacco Use   Smoking status: Former    Packs/day: 1.00    Years: 20.00    Total pack years: 20.00    Types: Cigarettes  Quit date: 11/2020    Years since quitting: 0.8   Smokeless tobacco: Never   Tobacco comments:    smokes 3 cigarettes per day-- 02/29/2020  Vaping Use   Vaping Use: Never used  Substance and Sexual Activity   Alcohol use: Yes    Comment: rare   Drug use: No   Sexual activity: Not on file  Other Topics Concern   Not on file  Social History Narrative   Patient lives at home with her husband Laura [name]).    Patient is not working as time but she trying for disability.   Education 10th grade   Caffeine daily coffee and mountain dew . One can daily.   .      Social Determinants of Health   Financial Resource Strain: Not on file  Food Insecurity: Not on file  Transportation [name]: Not on file  Physical Activity: Not on file  Stress: Not on file  Social Connections: Not on file  Intimate Partner Violence: Not on file     Review of Systems   Gen: Denies any fever, chills, fatigue, weight loss, lack of appetite.  CV: Denies chest pain, heart palpitations, peripheral edema, syncope.  Resp: Denies shortness of breath at rest or with exertion. Denies wheezing or cough.  GI:see HPI  GU : Denies urinary burning, urinary frequency, urinary hesitancy MS: Denies joint pain, muscle weakness, cramps, or  limitation of movement.  Derm: Denies rash, itching, dry skin Psych: Denies depression, anxiety, memory loss, and confusion Heme: Denies bruising, bleeding, and enlarged lymph nodes.   Physical Exam   BP (!) 144/86   Pulse 83   Temp (!) 97.5 F (36.4 C)   Ht 5' 1.5" (1.562 m)   Wt 152 lb (68.9 kg)   BMI 28.26 kg/m  General:   Alert and oriented. Pleasant and cooperative. Well-nourished and well-developed.  Head:  Normocephalic and atraumatic. Eyes:  Without icterus Abdomen:  +BS, soft, non-tender and non-distended. No HSM noted. No guarding or rebound. No masses appreciated.  Rectal:  Deferred  Msk:  Symmetrical without gross deformities. Normal posture. Extremities:  Without edema. Neurologic:  Alert and  oriented x4;  grossly normal neurologically. Skin:  Intact without significant lesions or rashes. Psych:  Alert and cooperative. Normal mood and affect.   Assessment   Jaritza Duignan is a 66 y.o. female presenting today in follow-up with a history of PBC, chronic GERD, chronic diarrhea.  PBC: recent labs completely normal. Known fatty liver but without advanced liver disease. [place] last in May 2022. Continues on Urso BID, Vit D, and has had DEXA with PCP. We still do not have these records but will request.   GERD: continue Aciphex.  Chronic diarrhea: responded well to Xifaxan in past. Doing well now overall with minimal flares. Recent colonoscopy negative.   PLAN   Retrieve DEXA results from PCP Continue Aciphex Continue Urso PCP monitoring labs Return in 1 year or sooner if needed   Laura Needs, PhD, ANP-BC [facility]

## 2021-08-25 NOTE — Patient Instructions (Signed)
I am glad you are doing well!  Make sure to take Aciphex on an empty stomach each morning.   We will see you back in 1 year or sooner if needed. Please call with any concerns!  I enjoyed seeing you again today! As you know, I value our relationship and want to provide genuine, compassionate, and quality care. I welcome your feedback. If you receive a survey regarding your visit,  I greatly appreciate you taking time to fill this out. See you next time!  Annitta Needs, PhD, ANP-BC [facility] Gastroenter[facility]

## 2021-08-25 NOTE — Telephone Encounter (Signed)
Manuela Schwartz, we had requested the DEXA scan results awhile ago, but I don't have. Can we request again from PCP? Thanks!

## 2021-08-28 ENCOUNTER — Ambulatory Visit (HOSPITAL_COMMUNITY)
Admission: RE | Admit: 2021-08-28 | Discharge: 2021-08-28 | Disposition: A | Discharge status: Home / Self Care | Payer: PPO | Source: Ambulatory Visit | Attending: Internal Medicine | Admitting: Internal Medicine

## 2021-08-28 DIAGNOSIS — M25851 Other specified joint disorders, right hip: Secondary | ICD-10-CM | POA: Diagnosis not present

## 2021-08-28 DIAGNOSIS — M549 Dorsalgia, unspecified: Secondary | ICD-10-CM | POA: Insufficient documentation

## 2021-08-28 DIAGNOSIS — M25852 Other specified joint disorders, left hip: Secondary | ICD-10-CM | POA: Diagnosis not present

## 2021-08-28 DIAGNOSIS — M160 Bilateral primary osteoarthritis of hip: Secondary | ICD-10-CM | POA: Diagnosis not present

## 2021-08-28 DIAGNOSIS — M5136 Other intervertebral disc degeneration, lumbar region: Secondary | ICD-10-CM | POA: Diagnosis not present

## 2021-08-28 DIAGNOSIS — K650 Generalized (acute) peritonitis: Secondary | ICD-10-CM | POA: Diagnosis not present

## 2021-08-28 DIAGNOSIS — M5126 Other intervertebral disc displacement, lumbar region: Secondary | ICD-10-CM | POA: Diagnosis not present

## 2021-09-08 DIAGNOSIS — G4733 Obstructive sleep apnea (adult) (pediatric): Secondary | ICD-10-CM | POA: Diagnosis not present

## 2021-09-10 DIAGNOSIS — G894 Chronic pain syndrome: Secondary | ICD-10-CM | POA: Diagnosis not present

## 2021-09-10 DIAGNOSIS — I10 Essential (primary) hypertension: Secondary | ICD-10-CM | POA: Diagnosis not present

## 2021-09-10 DIAGNOSIS — M5030 Other cervical disc degeneration, unspecified cervical region: Secondary | ICD-10-CM | POA: Diagnosis not present

## 2021-09-10 DIAGNOSIS — G43909 Migraine, unspecified, not intractable, without status migrainosus: Secondary | ICD-10-CM | POA: Diagnosis not present

## 2021-09-16 DIAGNOSIS — M5136 Other intervertebral disc degeneration, lumbar region: Secondary | ICD-10-CM | POA: Diagnosis not present

## 2021-09-16 DIAGNOSIS — Z6829 Body mass index (BMI) 29.0-29.9, adult: Secondary | ICD-10-CM | POA: Diagnosis not present

## 2021-09-24 DIAGNOSIS — M5416 Radiculopathy, lumbar region: Secondary | ICD-10-CM | POA: Diagnosis not present

## 2021-10-06 DIAGNOSIS — M5136 Other intervertebral disc degeneration, lumbar region: Secondary | ICD-10-CM | POA: Diagnosis not present

## 2021-10-08 DIAGNOSIS — Z6827 Body mass index (BMI) 27.0-27.9, adult: Secondary | ICD-10-CM | POA: Diagnosis not present

## 2021-10-08 DIAGNOSIS — G894 Chronic pain syndrome: Secondary | ICD-10-CM | POA: Diagnosis not present

## 2021-10-08 DIAGNOSIS — M5030 Other cervical disc degeneration, unspecified cervical region: Secondary | ICD-10-CM | POA: Diagnosis not present

## 2021-10-08 DIAGNOSIS — Z23 Encounter for immunization: Secondary | ICD-10-CM | POA: Diagnosis not present

## 2021-10-08 DIAGNOSIS — I10 Essential (primary) hypertension: Secondary | ICD-10-CM | POA: Diagnosis not present

## 2021-10-08 DIAGNOSIS — M5136 Other intervertebral disc degeneration, lumbar region: Secondary | ICD-10-CM | POA: Diagnosis not present

## 2021-10-08 DIAGNOSIS — E663 Overweight: Secondary | ICD-10-CM | POA: Diagnosis not present

## 2021-10-09 DIAGNOSIS — G4733 Obstructive sleep apnea (adult) (pediatric): Secondary | ICD-10-CM | POA: Diagnosis not present

## 2021-10-12 DIAGNOSIS — I10 Essential (primary) hypertension: Secondary | ICD-10-CM | POA: Diagnosis not present

## 2021-10-20 DIAGNOSIS — M5136 Other intervertebral disc degeneration, lumbar region: Secondary | ICD-10-CM | POA: Diagnosis not present

## 2021-10-23 DIAGNOSIS — M5136 Other intervertebral disc degeneration, lumbar region: Secondary | ICD-10-CM | POA: Diagnosis not present

## 2021-10-26 DIAGNOSIS — M5136 Other intervertebral disc degeneration, lumbar region: Secondary | ICD-10-CM | POA: Diagnosis not present

## 2021-10-28 DIAGNOSIS — M5136 Other intervertebral disc degeneration, lumbar region: Secondary | ICD-10-CM | POA: Diagnosis not present

## 2021-10-28 DIAGNOSIS — Z6829 Body mass index (BMI) 29.0-29.9, adult: Secondary | ICD-10-CM | POA: Diagnosis not present

## 2021-10-28 DIAGNOSIS — M5416 Radiculopathy, lumbar region: Secondary | ICD-10-CM | POA: Diagnosis not present

## 2021-11-02 NOTE — Progress Notes (Signed)
History of Present Illness: Laura [name] is a 66 y.o. year old female previously seen for OAB and hematuria evaluation.  Last seen in January.  Urgent visit today because of a 4-day history of left back pain.  No exacerbating relieving factors.  Steady, not crampy, not associated with nausea, vomiting, fever or chills.  She has had no gross hematuria.  She has had foul-smelling urine for about 3 weeks.  Biggest concern is an abnormality on 24-hour urine that was recently performed at [facility].  She said that she had an enzyme that was abnormal.  She does not have the results for me.  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)    GERD (gastroesophageal reflux disease)    H/O hiatal hernia    Hepatitis B antibody positive    letter from red cross, Hep B core total ab positive   History of melanoma excision    01-05-2014   Hypertension    IBS (irritable bowel syndrome)    Nocturnal hypoxia    OA (osteoarthritis) of knee    RIGHT   RLS (restless legs syndrome)    Rotator cuff impingement syndrome of left shoulder    Shortness of breath dyspnea    Unspecified hereditary and idiopathic peripheral neuropathy     Past Surgical History:  Procedure Laterality Date   ABDOMINAL HYSTERECTOMY     ANTERIOR CERVICAL DECOMP/DISCECTOMY FUSION  2000   C4 -- C5   BIOPSY  08/11/2015   Procedure: BIOPSY;  Surgeon: Daneil Dolin, MD;  [facility];  Service: Endoscopy;;  gastric polyp;   BIOPSY  06/03/2021   Procedure: BIOPSY;  Surgeon: Daneil Dolin, MD;  [facility];  Service: Endoscopy;;   cardiac cath     unremarkable   CARPAL TUNNEL RELEASE Right [place]  07/2007   Dr. Gala Romney: Terminal ileum normal. Status post segmental colonic mucosal biopsy.   COLONOSCOPY WITH PROPOFOL N/A 08/11/2015   normal colonoscopy with benign  colonic mucosa   COLONOSCOPY WITH PROPOFOL N/A 06/03/2021   one 4 mm polyps in sigmoid, normal TI, segmental biopsies taken. Hyperplastic polyp. Benign colonic biopsies.   ESOPHAGOGASTRODUODENOSCOPY  07/2007   Dr. Gala Romney: Small hiatal hernia, duodenal biopsy unremarkable. Fundic gland gastric polyps.   ESOPHAGOGASTRODUODENOSCOPY (EGD) WITH PROPOFOL N/A 08/11/2015   LA Grade A esophagitis, small hiatal hernia, few gastric polyps, normal duodenum (benign fundic gland polyp)   ESOPHAGOGASTRODUODENOSCOPY (EGD) WITH PROPOFOL N/A 06/03/2021   normal esophagus s/p dilation, abnormal gastric mucosa, small hiatal hernia, path with chronic gastritis, negative H.pylori.   KNEE ARTHROSCOPY WITH MEDIAL MENISECTOMY Right 05/25/2013   Procedure: RIGHT KNEE ARTHROSCOPY WITH PARTIAL LATERAL and MEDIAL MENISECTOMY AND CHONDROPLASTY;  Surgeon: Sydnee Cabal, MD;  Location: [place];  Service: Orthopedics;  Laterality: Right;   LUMBAR DISC SURGERY  08/22/2003   LEFT  L5  --  S1   MALONEY DILATION N/A 06/03/2021   Procedure: Venia Minks DILATION;  Surgeon: Daneil Dolin, MD;  [facility];  Service: Endoscopy;  Laterality: N/A;   MOHS SURGERY  01/05/2014   LEFT SIDE OF FACE   POLYPECTOMY  06/03/2021   Procedure: POLYPECTOMY;  Surgeon: Daneil Dolin, MD;  [facility];  Service: Endoscopy;;   SHOULDER ARTHROSCOPY WITH SUBACROMIAL DECOMPRESSION Left 01/29/2014  Procedure: LEFT SHOULDER ARTHROSCOPY WITH SUBACROMIAL DECOMPRESSION/DISTAL CLAVICLE RESECTION AND DEBRIDEMENT;  Surgeon: Sydnee Cabal, MD;  Location: [place];  Service: Orthopedics;  Laterality: Left;   TOTAL ABDOMINAL HYSTERECTOMY W/ BILATERAL SALPINGOOPHORECTOMY  1999    Home Medications:  (Not in a hospital admission)   Allergies:  Allergies  Allergen Reactions   Aspirin Other (See Comments)    Severe stomach upset due to IBS.   Celebrex [Celecoxib] Hives   Effexor [Venlafaxine] Other  (See Comments)    Unknown    Family History  Problem Relation Age of Onset   Heart attack Mother    Lung cancer Mother    HIV/AIDS Father    Sjogren's syndrome Sister    Diabetes Brother    Breast cancer Maternal Aunt    Heart attack Daughter    Drug abuse Son    ADD / ADHD Grandchild    Crohn's disease Cousin    Colon cancer Neg Hx    Colon polyps Neg Hx     Social History:  reports that she quit smoking about a year ago. Her smoking use included cigarettes. She has a 20.00 pack-year smoking history. She has never used smokeless tobacco. She reports current alcohol use. She reports that she does not use drugs.  ROS: A complete review of systems was performed.  All systems are negative except for pertinent findings as noted.  Physical Exam:  Vital signs in last 24 hours: '@VSRANGES'$ @ General:  Alert and oriented, No acute distress HEENT: Normocephalic, atraumatic Neck: No JVD or lymphadenopathy Cardiovascular: Regular rate  Lungs: Normal inspiratory/expiratory excursion Abdomen: He is, no mass, no tenderness. Back: Left-sided lumbar and CVA tenderness Extremities: No edema Neurologic: Grossly intact  I have reviewed prior pt notes  I have reviewed notes from referring/previous physicians  I have reviewed urinalysis results--appears to have cystitis  I have independently reviewed prior imaging--CT images from February of this year were reviewed with her  24-hour urine results sent for  Impression/Assessment:  1.  Left-sided back pain.  I do not think it is necessarily kidney related.  She did not have stones on the CT scan back in February of this year.  2.  Probable cystitis  3.  History of microscopic hematuria with negative evaluation in the past   Plan:  1.  Urine was cultured, sent in Keflex  2.  We will get 24-hour urine results  3.  I will see her back in 1 month to recheck  4.  I told her that I do not think her back pain is related to kidney  origin.  I would suggest that she see Dr. Riley Kill back if she does not get better with the [facility] 11/02/2021, 7:48 PM  Lillette Boxer. Yoshiaki Kreuser MD

## 2021-11-03 ENCOUNTER — Ambulatory Visit: Payer: PPO | Admitting: Urology

## 2021-11-03 ENCOUNTER — Encounter: Payer: Self-pay | Admitting: Urology

## 2021-11-03 VITALS — BP 179/90 | HR 98

## 2021-11-03 DIAGNOSIS — R82998 Other abnormal findings in urine: Secondary | ICD-10-CM

## 2021-11-03 DIAGNOSIS — R3129 Other microscopic hematuria: Secondary | ICD-10-CM | POA: Diagnosis not present

## 2021-11-03 DIAGNOSIS — M5489 Other dorsalgia: Secondary | ICD-10-CM

## 2021-11-03 DIAGNOSIS — N3000 Acute cystitis without hematuria: Secondary | ICD-10-CM | POA: Diagnosis not present

## 2021-11-03 DIAGNOSIS — R3915 Urgency of urination: Secondary | ICD-10-CM | POA: Diagnosis not present

## 2021-11-03 DIAGNOSIS — R350 Frequency of micturition: Secondary | ICD-10-CM

## 2021-11-03 MED ORDER — CEPHALEXIN 500 MG PO CAPS: 500.0000 mg | ORAL_CAPSULE | Freq: Two times a day (BID) | ORAL | 0 refills | Status: AC

## 2021-11-04 LAB — URINALYSIS, ROUTINE W REFLEX MICROSCOPIC
Bilirubin, UA: NEGATIVE
Nitrite, UA: POSITIVE — AB
Specific Gravity, UA: 1.015 (ref 1.005–1.030)
Urobilinogen, Ur: 0.2 mg/dL (ref 0.2–1.0)
pH, UA: 6 (ref 5.0–7.5)

## 2021-11-04 LAB — MICROSCOPIC EXAMINATION: WBC, UA: >30 /hpf — AB (ref 0–5)

## 2021-11-06 ENCOUNTER — Telehealth: Payer: Self-pay | Admitting: Gastroenterology

## 2021-11-06 NOTE — Telephone Encounter (Signed)
Manuela Schwartz:  We requested DEXA from [facility], but they only sent the order info. We don't have the actual report from them. Can we request again? Thanks!

## 2021-11-07 LAB — URINE CULTURE

## 2021-11-08 DIAGNOSIS — G4733 Obstructive sleep apnea (adult) (pediatric): Secondary | ICD-10-CM | POA: Diagnosis not present

## 2021-11-12 NOTE — Telephone Encounter (Signed)
Faxed another request to [facility]

## 2021-11-27 DIAGNOSIS — M5136 Other intervertebral disc degeneration, lumbar region: Secondary | ICD-10-CM | POA: Diagnosis not present

## 2021-11-27 DIAGNOSIS — G894 Chronic pain syndrome: Secondary | ICD-10-CM | POA: Diagnosis not present

## 2021-11-27 DIAGNOSIS — Z6828 Body mass index (BMI) 28.0-28.9, adult: Secondary | ICD-10-CM | POA: Diagnosis not present

## 2021-11-27 DIAGNOSIS — M5030 Other cervical disc degeneration, unspecified cervical region: Secondary | ICD-10-CM | POA: Diagnosis not present

## 2021-11-27 DIAGNOSIS — I10 Essential (primary) hypertension: Secondary | ICD-10-CM | POA: Diagnosis not present

## 2021-12-01 ENCOUNTER — Ambulatory Visit: Payer: PPO | Admitting: Urology

## 2021-12-01 ENCOUNTER — Encounter: Payer: Self-pay | Admitting: Urology

## 2021-12-01 VITALS — BP 136/82 | HR 89

## 2021-12-01 DIAGNOSIS — N3000 Acute cystitis without hematuria: Secondary | ICD-10-CM | POA: Diagnosis not present

## 2021-12-01 DIAGNOSIS — R82998 Other abnormal findings in urine: Secondary | ICD-10-CM | POA: Diagnosis not present

## 2021-12-01 DIAGNOSIS — E278 Other specified disorders of adrenal gland: Secondary | ICD-10-CM | POA: Diagnosis not present

## 2021-12-01 DIAGNOSIS — R3129 Other microscopic hematuria: Secondary | ICD-10-CM

## 2021-12-01 LAB — MICROSCOPIC EXAMINATION: WBC, UA: >30 /hpf — AB (ref 0–5)

## 2021-12-01 LAB — URINALYSIS, ROUTINE W REFLEX MICROSCOPIC
Bilirubin, UA: NEGATIVE
Nitrite, UA: POSITIVE — AB
Protein,UA: NEGATIVE
Specific Gravity, UA: 1.015 (ref 1.005–1.030)
Urobilinogen, Ur: 0.2 mg/dL (ref 0.2–1.0)
pH, UA: 5.5 (ref 5.0–7.5)

## 2021-12-01 MED ORDER — NITROFURANTOIN MONOHYD MACRO 100 MG PO CAPS: 100.0000 mg | ORAL_CAPSULE | Freq: Two times a day (BID) | ORAL | 0 refills | Status: AC

## 2021-12-01 NOTE — Progress Notes (Signed)
History of Present Illness: Laura [name] is a 66 y.o. year old female seen a month ago for evaluation for left flank pain, probable cystitis and anxiety about an abnormality on a 24-hour urine test performed by her PCP.  We sent for 24-hour results.  It revealed just slightly increased plasma renin activity.  From what I can see, all other results from that small panel were normal.  It was felt that her left flank pain was not of kidney origin.  She had a CT done earlier in 2023 revealing no urolithiasis.  It did reveal a 2.2 cm left adrenal adenoma which seemingly stable over a period of time.  Since her visit, she has had resolution of her left flank pain.  No gross hematuria, no dysuria.  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)    GERD (gastroesophageal reflux disease)    H/O hiatal hernia    Hepatitis B antibody positive    letter from red cross, Hep B core total ab positive   History of melanoma excision    01-05-2014   Hypertension    IBS (irritable bowel syndrome)    Nocturnal hypoxia    OA (osteoarthritis) of knee    RIGHT   RLS (restless legs syndrome)    Rotator cuff impingement syndrome of left shoulder    Shortness of breath dyspnea    Unspecified hereditary and idiopathic peripheral neuropathy     Past Surgical History:  Procedure Laterality Date   ABDOMINAL HYSTERECTOMY     ANTERIOR CERVICAL DECOMP/DISCECTOMY FUSION  2000   C4 -- C5   BIOPSY  08/11/2015   Procedure: BIOPSY;  Surgeon: Daneil Dolin, MD;  [facility];  Service: Endoscopy;;  gastric polyp;   BIOPSY  06/03/2021   Procedure: BIOPSY;  Surgeon: Daneil Dolin, MD;  [facility];  Service: Endoscopy;;   cardiac cath     unremarkable   CARPAL TUNNEL RELEASE Right [place]  07/2007   Dr. Gala Romney: Terminal ileum normal. Status post segmental colonic  mucosal biopsy.   COLONOSCOPY WITH PROPOFOL N/A 08/11/2015   normal colonoscopy with benign colonic mucosa   COLONOSCOPY WITH PROPOFOL N/A 06/03/2021   one 4 mm polyps in sigmoid, normal TI, segmental biopsies taken. Hyperplastic polyp. Benign colonic biopsies.   ESOPHAGOGASTRODUODENOSCOPY  07/2007   Dr. Gala Romney: Small hiatal hernia, duodenal biopsy unremarkable. Fundic gland gastric polyps.   ESOPHAGOGASTRODUODENOSCOPY (EGD) WITH PROPOFOL N/A 08/11/2015   LA Grade A esophagitis, small hiatal hernia, few gastric polyps, normal duodenum (benign fundic gland polyp)   ESOPHAGOGASTRODUODENOSCOPY (EGD) WITH PROPOFOL N/A 06/03/2021   normal esophagus s/p dilation, abnormal gastric mucosa, small hiatal hernia, path with chronic gastritis, negative H.pylori.   KNEE ARTHROSCOPY WITH MEDIAL MENISECTOMY Right 05/25/2013   Procedure: RIGHT KNEE ARTHROSCOPY WITH PARTIAL LATERAL and MEDIAL MENISECTOMY AND CHONDROPLASTY;  Surgeon: Sydnee Cabal, MD;  Location: [place];  Service: Orthopedics;  Laterality: Right;   LUMBAR DISC SURGERY  08/22/2003   LEFT  L5  --  S1   MALONEY DILATION N/A 06/03/2021   Procedure: Venia Minks DILATION;  Surgeon: Daneil Dolin, MD;  [facility];  Service: Endoscopy;  Laterality: N/A;   MOHS SURGERY  01/05/2014   LEFT SIDE OF FACE   POLYPECTOMY  06/03/2021   Procedure: POLYPECTOMY;  Surgeon:  Daneil Dolin, MD;  [facility];  Service: Endoscopy;;   SHOULDER ARTHROSCOPY WITH SUBACROMIAL DECOMPRESSION Left 01/29/2014   Procedure: LEFT SHOULDER ARTHROSCOPY WITH SUBACROMIAL DECOMPRESSION/DISTAL CLAVICLE RESECTION AND DEBRIDEMENT;  Surgeon: Sydnee Cabal, MD;  Location: [place];  Service: Orthopedics;  Laterality: Left;   TOTAL ABDOMINAL HYSTERECTOMY W/ BILATERAL SALPINGOOPHORECTOMY  1999    Home Medications:  (Not in a hospital admission)   Allergies:  Allergies  Allergen Reactions   Aspirin Other (See Comments)     Severe stomach upset due to IBS.   Celebrex [Celecoxib] Hives   Effexor [Venlafaxine] Other (See Comments)    Unknown    Family History  Problem Relation Age of Onset   Heart attack Mother    Lung cancer Mother    HIV/AIDS Father    Sjogren's syndrome Sister    Diabetes Brother    Breast cancer Maternal Aunt    Heart attack Daughter    Drug abuse Son    ADD / ADHD Grandchild    Crohn's disease Cousin    Colon cancer Neg Hx    Colon polyps Neg Hx     Social History:  reports that she quit smoking about 12 months ago. Her smoking use included cigarettes. She has a 20.00 pack-year smoking history. She has never used smokeless tobacco. She reports current alcohol use. She reports that she does not use drugs.  ROS: A complete review of systems was performed.  All systems are negative except for pertinent findings as noted.  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  UTI, versus  2.  Left flank pain, resolved-that may have been from UTI  3.  Left adrenal adenoma, stable in size, I do not think she needs further imaging    Plan:  1.  Patient reassured about her radiographic findings  2.  Urine was cultured, she was prescribed Macrobid, I will have her come back in about 4 weeks to recheck    Jorja Loa 12/01/2021, 8:19 AM  Lillette Boxer. Cannon Arreola MD

## 2021-12-04 LAB — URINE CULTURE

## 2021-12-07 ENCOUNTER — Telehealth: Payer: Self-pay

## 2021-12-07 ENCOUNTER — Other Ambulatory Visit: Payer: Self-pay | Admitting: Urology

## 2021-12-07 DIAGNOSIS — N3000 Acute cystitis without hematuria: Secondary | ICD-10-CM

## 2021-12-07 MED ORDER — CIPROFLOXACIN HCL 250 MG PO TABS: 250.0000 mg | ORAL_TABLET | Freq: Two times a day (BID) | ORAL | 0 refills | Status: DC

## 2021-12-07 NOTE — Telephone Encounter (Signed)
-----   Message from Franchot Gallo, MD sent at 12/07/2021  9:52 AM EST ----- Please call patient-culture was positive but resistant to [place].  I sent in Cipro instead. ----- Message ----- From: Audie Box, CMA Sent: 12/07/2021   7:59 AM EST To: Franchot Gallo, MD  Please review, patient started on macrobid 10/31

## 2021-12-07 NOTE — Telephone Encounter (Signed)
Patient aware of MD response and voiced understanding.

## 2021-12-08 ENCOUNTER — Other Ambulatory Visit: Payer: Self-pay

## 2021-12-08 DIAGNOSIS — N3000 Acute cystitis without hematuria: Secondary | ICD-10-CM

## 2021-12-08 MED ORDER — CIPROFLOXACIN HCL 250 MG PO TABS: 250.0000 mg | ORAL_TABLET | Freq: Two times a day (BID) | ORAL | 0 refills | Status: DC

## 2021-12-08 NOTE — Progress Notes (Signed)
Made patient aware that I resent in prescription for CIPRO. Patient voiced understanding.

## 2021-12-09 DIAGNOSIS — G4733 Obstructive sleep apnea (adult) (pediatric): Secondary | ICD-10-CM | POA: Diagnosis not present

## 2021-12-10 ENCOUNTER — Telehealth: Payer: Self-pay

## 2021-12-10 ENCOUNTER — Other Ambulatory Visit: Payer: Self-pay

## 2021-12-10 DIAGNOSIS — N3000 Acute cystitis without hematuria: Secondary | ICD-10-CM

## 2021-12-10 MED ORDER — CIPROFLOXACIN HCL 250 MG PO TABS: 250.0000 mg | ORAL_TABLET | Freq: Two times a day (BID) | ORAL | 0 refills | Status: DC

## 2021-12-10 NOTE — Telephone Encounter (Signed)
Patient called and left a vm 1/8 in the afternoon advising that she went to pick up antibiotic from [facility] and they advised that they did not receive the prescription. Patient wanted to verify that the medication was sent in to the pharmacy. Patient would like a call once medication is sent in.    Thank you

## 2021-12-10 NOTE — Telephone Encounter (Signed)
ABT resent to pharmacy with confirmation. Patient called and made aware.

## 2021-12-15 DIAGNOSIS — E663 Overweight: Secondary | ICD-10-CM | POA: Diagnosis not present

## 2021-12-15 DIAGNOSIS — Z6828 Body mass index (BMI) 28.0-28.9, adult: Secondary | ICD-10-CM | POA: Diagnosis not present

## 2021-12-15 DIAGNOSIS — I10 Essential (primary) hypertension: Secondary | ICD-10-CM | POA: Diagnosis not present

## 2021-12-15 DIAGNOSIS — G894 Chronic pain syndrome: Secondary | ICD-10-CM | POA: Diagnosis not present

## 2021-12-15 DIAGNOSIS — M5136 Other intervertebral disc degeneration, lumbar region: Secondary | ICD-10-CM | POA: Diagnosis not present

## 2021-12-15 DIAGNOSIS — M5030 Other cervical disc degeneration, unspecified cervical region: Secondary | ICD-10-CM | POA: Diagnosis not present

## 2021-12-15 DIAGNOSIS — N10 Acute tubulo-interstitial nephritis: Secondary | ICD-10-CM | POA: Diagnosis not present

## 2021-12-15 DIAGNOSIS — N390 Urinary tract infection, site not specified: Secondary | ICD-10-CM | POA: Diagnosis not present

## 2021-12-29 ENCOUNTER — Ambulatory Visit (INDEPENDENT_AMBULATORY_CARE_PROVIDER_SITE_OTHER): Payer: PPO | Admitting: Urology

## 2021-12-29 ENCOUNTER — Encounter: Payer: Self-pay | Admitting: Urology

## 2021-12-29 VITALS — BP 135/84 | HR 86

## 2021-12-29 DIAGNOSIS — Z87440 Personal history of urinary (tract) infections: Secondary | ICD-10-CM | POA: Diagnosis not present

## 2021-12-29 DIAGNOSIS — R350 Frequency of micturition: Secondary | ICD-10-CM | POA: Diagnosis not present

## 2021-12-29 DIAGNOSIS — R3915 Urgency of urination: Secondary | ICD-10-CM | POA: Diagnosis not present

## 2021-12-29 DIAGNOSIS — N3000 Acute cystitis without hematuria: Secondary | ICD-10-CM

## 2021-12-29 DIAGNOSIS — D3502 Benign neoplasm of left adrenal gland: Secondary | ICD-10-CM

## 2021-12-29 DIAGNOSIS — R3129 Other microscopic hematuria: Secondary | ICD-10-CM

## 2021-12-29 LAB — URINALYSIS, ROUTINE W REFLEX MICROSCOPIC
Bilirubin, UA: NEGATIVE
Leukocytes,UA: NEGATIVE
Nitrite, UA: NEGATIVE
Protein,UA: NEGATIVE
Specific Gravity, UA: 1.025 (ref 1.005–1.030)
Urobilinogen, Ur: 0.2 mg/dL (ref 0.2–1.0)
pH, UA: 5 (ref 5.0–7.5)

## 2021-12-29 LAB — MICROSCOPIC EXAMINATION

## 2021-12-29 NOTE — Progress Notes (Signed)
History of Present Illness:    Laura [name] is a 66 y.o. year old female seen previously for evaluation for left flank pain, probable cystitis and anxiety about an abnormality on a 24-hour urine test performed by her PCP.   We sent for 24-hour results.  It revealed just slightly increased plasma renin activity.  From what I can see, all other results from that small panel were normal.   It was felt that her left flank pain was not of kidney origin.  She had a CT done earlier in 2023 revealing no urolithiasis.  It did reveal a 2.2 cm left adrenal adenoma which seemingly stable over a period of time.    11.28.2023: Here for follow-up.  She grew Proteus at her last visit which was resistant to the Macrobid she was placed on.  I then sent in Cipro.  She has no symptoms of UTI.  Multiple questions regarding her fatigue and if that is related to her cystitis.  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)    GERD (gastroesophageal reflux disease)    H/O hiatal hernia    Hepatitis B antibody positive    letter from red cross, Hep B core total ab positive   History of melanoma excision    01-05-2014   Hypertension    IBS (irritable bowel syndrome)    Nocturnal hypoxia    OA (osteoarthritis) of knee    RIGHT   RLS (restless legs syndrome)    Rotator cuff impingement syndrome of left shoulder    Shortness of breath dyspnea    Unspecified hereditary and idiopathic peripheral neuropathy     Past Surgical History:  Procedure Laterality Date   ABDOMINAL HYSTERECTOMY     ANTERIOR CERVICAL DECOMP/DISCECTOMY FUSION  2000   C4 -- C5   BIOPSY  08/11/2015   Procedure: BIOPSY;  Surgeon: Daneil Dolin, MD;  [facility];  Service: Endoscopy;;  gastric polyp;   BIOPSY  06/03/2021   Procedure: BIOPSY;  Surgeon: Daneil Dolin, MD;  [facility];  Service: Endoscopy;;   cardiac cath      unremarkable   CARPAL TUNNEL RELEASE Right [place]  07/2007   Dr. Gala Romney: Terminal ileum normal. Status post segmental colonic mucosal biopsy.   COLONOSCOPY WITH PROPOFOL N/A 08/11/2015   normal colonoscopy with benign colonic mucosa   COLONOSCOPY WITH PROPOFOL N/A 06/03/2021   one 4 mm polyps in sigmoid, normal TI, segmental biopsies taken. Hyperplastic polyp. Benign colonic biopsies.   ESOPHAGOGASTRODUODENOSCOPY  07/2007   Dr. Gala Romney: Small hiatal hernia, duodenal biopsy unremarkable. Fundic gland gastric polyps.   ESOPHAGOGASTRODUODENOSCOPY (EGD) WITH PROPOFOL N/A 08/11/2015   LA Grade A esophagitis, small hiatal hernia, few gastric polyps, normal duodenum (benign fundic gland polyp)   ESOPHAGOGASTRODUODENOSCOPY (EGD) WITH PROPOFOL N/A 06/03/2021   normal esophagus s/p dilation, abnormal gastric mucosa, small hiatal hernia, path with chronic gastritis, negative H.pylori.   KNEE ARTHROSCOPY WITH MEDIAL MENISECTOMY Right 05/25/2013   Procedure: RIGHT KNEE ARTHROSCOPY WITH PARTIAL LATERAL and MEDIAL MENISECTOMY AND CHONDROPLASTY;  Surgeon: Sydnee Cabal, MD;  Location: [place];  Service: Orthopedics;  Laterality: Right;   LUMBAR [place] SURGERY  08/22/2003   LEFT  L5  --  S1   MALONEY DILATION N/A 06/03/2021   Procedure: Venia Minks DILATION;  Surgeon: Manus Rudd  M, MD;  [facility];  Service: Endoscopy;  Laterality: N/A;   MOHS SURGERY  01/05/2014   LEFT SIDE OF FACE   POLYPECTOMY  06/03/2021   Procedure: POLYPECTOMY;  Surgeon: Daneil Dolin, MD;  [facility];  Service: Endoscopy;;   SHOULDER ARTHROSCOPY WITH SUBACROMIAL DECOMPRESSION Left 01/29/2014   Procedure: LEFT SHOULDER ARTHROSCOPY WITH SUBACROMIAL DECOMPRESSION/DISTAL CLAVICLE RESECTION AND DEBRIDEMENT;  Surgeon: Sydnee Cabal, MD;  Location: [place];  Service: Orthopedics;  Laterality: Left;   TOTAL ABDOMINAL HYSTERECTOMY W/ BILATERAL  SALPINGOOPHORECTOMY  1999    Home Medications:  (Not in a hospital admission)   Allergies:  Allergies  Allergen Reactions   Aspirin Other (See Comments)    Severe stomach upset due to IBS.   Celebrex [Celecoxib] Hives   Effexor [Venlafaxine] Other (See Comments)    Unknown    Family History  Problem Relation Age of Onset   Heart attack Mother    Lung cancer Mother    HIV/AIDS Father    Sjogren's syndrome Sister    Diabetes Brother    Breast cancer Maternal Aunt    Heart attack Daughter    Drug abuse Son    ADD / ADHD Grandchild    Crohn's disease Cousin    Colon cancer Neg Hx    Colon polyps Neg Hx     Social History:  reports that she quit smoking about 13 months ago. Her smoking use included cigarettes. She has a 20.00 pack-year smoking history. She has never used smokeless tobacco. She reports current alcohol use. She reports that she does not use drugs.  ROS: A complete review of systems was performed.  All systems are negative except for pertinent findings as noted.  Physical Exam:  Vital signs in last 24 hours: '@VSRANGES'$ @ General:  Alert and oriented, No acute distress HEENT: Normocephalic, atraumatic Neck: No JVD or lymphadenopathy Cardiovascular: Regular rate  Lungs: Normal inspiratory/expiratory excursion Abdomen: No masses.  No CVA tenderness. Neurologic: Grossly intact  I have reviewed prior pt notes  I have reviewed urinalysis results  I have independently reviewed prior imaging--we reviewed her CT images from February 2023.  No stones noted  I have reviewed prior urine culture   Impression/Assessment:  1.  Recurrent UTI-currently, clear urine  2.  Back pain, not of GU etiology  3.  Left adrenal adenoma, stable in size, small    Plan:  1.  Patient reassured about adrenal adenoma seen on CT scan  2.  I will have her come back to follow-up with me as needed  Jorja Loa 12/29/2021, 8:20 AM  Lillette Boxer. Flemon Kelty MD

## 2022-01-05 DIAGNOSIS — D225 Melanocytic nevi of trunk: Secondary | ICD-10-CM | POA: Diagnosis not present

## 2022-01-05 DIAGNOSIS — Z85820 Personal history of malignant melanoma of skin: Secondary | ICD-10-CM | POA: Diagnosis not present

## 2022-01-05 DIAGNOSIS — D485 Neoplasm of uncertain behavior of skin: Secondary | ICD-10-CM | POA: Diagnosis not present

## 2022-01-05 DIAGNOSIS — Z1283 Encounter for screening for malignant neoplasm of skin: Secondary | ICD-10-CM | POA: Diagnosis not present

## 2022-01-05 DIAGNOSIS — Z08 Encounter for follow-up examination after completed treatment for malignant neoplasm: Secondary | ICD-10-CM | POA: Diagnosis not present

## 2022-01-08 DIAGNOSIS — G4733 Obstructive sleep apnea (adult) (pediatric): Secondary | ICD-10-CM | POA: Diagnosis not present

## 2022-01-14 DIAGNOSIS — J0100 Acute maxillary sinusitis, unspecified: Secondary | ICD-10-CM | POA: Diagnosis not present

## 2022-01-14 DIAGNOSIS — N390 Urinary tract infection, site not specified: Secondary | ICD-10-CM | POA: Diagnosis not present

## 2022-01-14 DIAGNOSIS — M5136 Other intervertebral disc degeneration, lumbar region: Secondary | ICD-10-CM | POA: Diagnosis not present

## 2022-01-14 DIAGNOSIS — G894 Chronic pain syndrome: Secondary | ICD-10-CM | POA: Diagnosis not present

## 2022-01-14 DIAGNOSIS — J209 Acute bronchitis, unspecified: Secondary | ICD-10-CM | POA: Diagnosis not present

## 2022-01-14 DIAGNOSIS — E663 Overweight: Secondary | ICD-10-CM | POA: Diagnosis not present

## 2022-01-14 DIAGNOSIS — Z6828 Body mass index (BMI) 28.0-28.9, adult: Secondary | ICD-10-CM | POA: Diagnosis not present

## 2022-01-14 DIAGNOSIS — I10 Essential (primary) hypertension: Secondary | ICD-10-CM | POA: Diagnosis not present

## 2022-02-09 DIAGNOSIS — F330 Major depressive disorder, recurrent, mild: Secondary | ICD-10-CM | POA: Diagnosis not present

## 2022-02-09 DIAGNOSIS — E2749 Other adrenocortical insufficiency: Secondary | ICD-10-CM | POA: Diagnosis not present

## 2022-02-09 DIAGNOSIS — M5136 Other intervertebral disc degeneration, lumbar region: Secondary | ICD-10-CM | POA: Diagnosis not present

## 2022-02-09 DIAGNOSIS — E236 Other disorders of pituitary gland: Secondary | ICD-10-CM | POA: Diagnosis not present

## 2022-02-23 DIAGNOSIS — N390 Urinary tract infection, site not specified: Secondary | ICD-10-CM | POA: Diagnosis not present

## 2022-03-02 DIAGNOSIS — Z1231 Encounter for screening mammogram for malignant neoplasm of breast: Secondary | ICD-10-CM | POA: Diagnosis not present

## 2022-03-15 DIAGNOSIS — I10 Essential (primary) hypertension: Secondary | ICD-10-CM | POA: Diagnosis not present

## 2022-03-15 DIAGNOSIS — E2749 Other adrenocortical insufficiency: Secondary | ICD-10-CM | POA: Diagnosis not present

## 2022-03-15 DIAGNOSIS — Z0001 Encounter for general adult medical examination with abnormal findings: Secondary | ICD-10-CM | POA: Diagnosis not present

## 2022-03-15 DIAGNOSIS — Z1331 Encounter for screening for depression: Secondary | ICD-10-CM | POA: Diagnosis not present

## 2022-03-15 DIAGNOSIS — N390 Urinary tract infection, site not specified: Secondary | ICD-10-CM | POA: Diagnosis not present

## 2022-03-15 DIAGNOSIS — F330 Major depressive disorder, recurrent, mild: Secondary | ICD-10-CM | POA: Diagnosis not present

## 2022-03-15 DIAGNOSIS — Z6829 Body mass index (BMI) 29.0-29.9, adult: Secondary | ICD-10-CM | POA: Diagnosis not present

## 2022-03-15 DIAGNOSIS — E663 Overweight: Secondary | ICD-10-CM | POA: Diagnosis not present

## 2022-03-15 DIAGNOSIS — G894 Chronic pain syndrome: Secondary | ICD-10-CM | POA: Diagnosis not present

## 2022-03-15 DIAGNOSIS — B3731 Acute candidiasis of vulva and vagina: Secondary | ICD-10-CM | POA: Diagnosis not present

## 2022-03-15 DIAGNOSIS — E236 Other disorders of pituitary gland: Secondary | ICD-10-CM | POA: Diagnosis not present

## 2022-03-15 DIAGNOSIS — I700 Atherosclerosis of aorta: Secondary | ICD-10-CM | POA: Diagnosis not present

## 2022-03-16 ENCOUNTER — Other Ambulatory Visit: Payer: Self-pay | Admitting: Gastroenterology

## 2022-03-22 NOTE — Progress Notes (Signed)
History of Present Illness: Laura [name] is a 67 y.o. year old female here for follow-up of urinary tract infections.  A at her next the last visit she grew Proteus.  She has had no lower urinary tract symptoms recently but had left-sided back pain.  Apparently grew out E. coli and was placed on doxycycline.  She was told she had a kidney infection although she did not have high fever.  Currently asymptomatic.  No dysuria, gross hematuria, frequency or urgency.  No back pain (she has chronic back pain and has had multiple issues with this in the past).  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)    GERD (gastroesophageal reflux disease)    H/O hiatal hernia    Hepatitis B antibody positive    letter from red cross, Hep B core total ab positive   History of melanoma excision    01-05-2014   Hypertension    IBS (irritable bowel syndrome)    Nocturnal hypoxia    OA (osteoarthritis) of knee    RIGHT   RLS (restless legs syndrome)    Rotator cuff impingement syndrome of left shoulder    Shortness of breath dyspnea    Unspecified hereditary and idiopathic peripheral neuropathy     Past Surgical History:  Procedure Laterality Date   ABDOMINAL HYSTERECTOMY     ANTERIOR CERVICAL DECOMP/DISCECTOMY FUSION  2000   C4 -- C5   BIOPSY  08/11/2015   Procedure: BIOPSY;  Surgeon: Daneil Dolin, MD;  [facility];  Service: Endoscopy;;  gastric polyp;   BIOPSY  06/03/2021   Procedure: BIOPSY;  Surgeon: Daneil Dolin, MD;  [facility];  Service: Endoscopy;;   cardiac cath     unremarkable   CARPAL TUNNEL RELEASE Right [place]  07/2007   Dr. Gala Romney: Terminal ileum normal. Status post segmental colonic mucosal biopsy.   COLONOSCOPY WITH PROPOFOL N/A 08/11/2015   normal colonoscopy with benign colonic mucosa   COLONOSCOPY WITH PROPOFOL N/A  06/03/2021   one 4 mm polyps in sigmoid, normal TI, segmental biopsies taken. Hyperplastic polyp. Benign colonic biopsies.   ESOPHAGOGASTRODUODENOSCOPY  07/2007   Dr. Gala Romney: Small hiatal hernia, duodenal biopsy unremarkable. Fundic gland gastric polyps.   ESOPHAGOGASTRODUODENOSCOPY (EGD) WITH PROPOFOL N/A 08/11/2015   LA Grade A esophagitis, small hiatal hernia, few gastric polyps, normal duodenum (benign fundic gland polyp)   ESOPHAGOGASTRODUODENOSCOPY (EGD) WITH PROPOFOL N/A 06/03/2021   normal esophagus s/p dilation, abnormal gastric mucosa, small hiatal hernia, path with chronic gastritis, negative H.pylori.   KNEE ARTHROSCOPY WITH MEDIAL MENISECTOMY Right 05/25/2013   Procedure: RIGHT KNEE ARTHROSCOPY WITH PARTIAL LATERAL and MEDIAL MENISECTOMY AND CHONDROPLASTY;  Surgeon: Sydnee Cabal, MD;  Location: [place];  Service: Orthopedics;  Laterality: Right;   LUMBAR DISC SURGERY  08/22/2003   LEFT  L5  --  S1   MALONEY DILATION N/A 06/03/2021   Procedure: Venia Minks DILATION;  Surgeon: Daneil Dolin, MD;  [facility];  Service: Endoscopy;  Laterality: N/A;   MOHS SURGERY  01/05/2014   LEFT SIDE OF FACE   POLYPECTOMY  06/03/2021   Procedure: POLYPECTOMY;  Surgeon: Daneil Dolin, MD;  [facility];  Service: Endoscopy;;   SHOULDER ARTHROSCOPY WITH SUBACROMIAL DECOMPRESSION Left 01/29/2014   Procedure: LEFT SHOULDER ARTHROSCOPY WITH  SUBACROMIAL DECOMPRESSION/DISTAL CLAVICLE RESECTION AND DEBRIDEMENT;  Surgeon: Sydnee Cabal, MD;  Location: [facility];  Service: Orthopedics;  Laterality: Left;   TOTAL ABDOMINAL HYSTERECTOMY W/ BILATERAL SALPINGOOPHORECTOMY  1999    Home Medications:  (Not in a hospital admission)   Allergies:  Allergies  Allergen Reactions   Aspirin Other (See Comments)    Severe stomach upset due to IBS.   Celebrex [Celecoxib] Hives   Effexor [Venlafaxine] Other (See Comments)    Unknown    Family History   Problem Relation Age of Onset   Heart attack Mother    Lung cancer Mother    HIV/AIDS Father    Sjogren's syndrome Sister    Diabetes Brother    Breast cancer Maternal Aunt    Heart attack Daughter    Drug abuse Son    ADD / ADHD Grandchild    Crohn's disease Cousin    Colon cancer Neg Hx    Colon polyps Neg Hx     Social History:  reports that she quit smoking about 16 months ago. Her smoking use included cigarettes. She has a 20.00 pack-year smoking history. She has never used smokeless tobacco. She reports current alcohol use. She reports that she does not use drugs.  ROS: A complete review of systems was performed.  All systems are negative except for pertinent findings as noted.  Physical Exam:  Vital signs in last 24 hours: @VSRANGES$ @ General:  Alert and oriented, No acute distress HEENT: Normocephalic, atraumatic Neck: No JVD or lymphadenopathy Cardiovascular: Regular rate  Lungs: Normal inspiratory/expiratory excursion Neurologic: Grossly intact  I have reviewed prior pt notes  I have reviewed urinalysis results--clear today  I have independently reviewed prior imaging--I reviewed her February 2023 CT images with her.  Renal appearance looks normal, no stranding, no hydronephrosis or stones.  I have reviewed prior urine culture--last positive culture here was in October 2023-Proteus  Impression/Assessment:  Probable asymptomatic bacteriuria-caution not to treat these  Vaginal atrophic change  Back pain-doubt GU etiology  Plan:  1.  Patient cautioned not to take antibiotics for asymptomatic bacteriuria  2.  I will start her on estrogen cream  3.  Take probiotics after antibiotic use  4.  Start cranberry capsules daily  5.  I will see back in 6 months  Jorja Loa 03/22/2022, 5:23 PM  Lillette Boxer. Payeton Germani MD

## 2022-03-23 ENCOUNTER — Encounter: Payer: Self-pay | Admitting: Urology

## 2022-03-23 ENCOUNTER — Ambulatory Visit (INDEPENDENT_AMBULATORY_CARE_PROVIDER_SITE_OTHER): Payer: PPO | Admitting: Urology

## 2022-03-23 VITALS — BP 128/84 | HR 94

## 2022-03-23 DIAGNOSIS — N3000 Acute cystitis without hematuria: Secondary | ICD-10-CM

## 2022-03-23 DIAGNOSIS — Z87440 Personal history of urinary (tract) infections: Secondary | ICD-10-CM

## 2022-03-23 DIAGNOSIS — N952 Postmenopausal atrophic vaginitis: Secondary | ICD-10-CM | POA: Diagnosis not present

## 2022-03-23 DIAGNOSIS — M545 Low back pain, unspecified: Secondary | ICD-10-CM | POA: Diagnosis not present

## 2022-03-23 DIAGNOSIS — R350 Frequency of micturition: Secondary | ICD-10-CM

## 2022-03-23 DIAGNOSIS — R3129 Other microscopic hematuria: Secondary | ICD-10-CM | POA: Diagnosis not present

## 2022-03-23 LAB — URINALYSIS, ROUTINE W REFLEX MICROSCOPIC
Bilirubin, UA: NEGATIVE
Ketones, UA: NEGATIVE
Leukocytes,UA: NEGATIVE
Nitrite, UA: NEGATIVE
Protein,UA: NEGATIVE
RBC, UA: NEGATIVE
Specific Gravity, UA: 1.015 (ref 1.005–1.030)
Urobilinogen, Ur: 0.2 mg/dL (ref 0.2–1.0)
pH, UA: 6 (ref 5.0–7.5)

## 2022-03-23 LAB — BLADDER SCAN AMB NON-IMAGING: Scan Result: 13

## 2022-03-23 MED ORDER — ESTRADIOL 0.1 MG/GM VA CREA: TOPICAL_CREAM | VAGINAL | 3 refills | Status: DC

## 2022-03-23 NOTE — Progress Notes (Cosign Needed Addendum)
Pt here today for bladder scan. Bladder was scanned and 13 was visualized.   Performed by Marisue Brooklyn, CMA

## 2022-04-14 DIAGNOSIS — I10 Essential (primary) hypertension: Secondary | ICD-10-CM | POA: Diagnosis not present

## 2022-04-14 DIAGNOSIS — I700 Atherosclerosis of aorta: Secondary | ICD-10-CM | POA: Diagnosis not present

## 2022-04-14 DIAGNOSIS — N390 Urinary tract infection, site not specified: Secondary | ICD-10-CM | POA: Diagnosis not present

## 2022-04-14 DIAGNOSIS — E2749 Other adrenocortical insufficiency: Secondary | ICD-10-CM | POA: Diagnosis not present

## 2022-04-14 DIAGNOSIS — E663 Overweight: Secondary | ICD-10-CM | POA: Diagnosis not present

## 2022-04-14 DIAGNOSIS — Z6829 Body mass index (BMI) 29.0-29.9, adult: Secondary | ICD-10-CM | POA: Diagnosis not present

## 2022-04-21 ENCOUNTER — Other Ambulatory Visit: Payer: Self-pay | Admitting: Gastroenterology

## 2022-04-22 DIAGNOSIS — Z0001 Encounter for general adult medical examination with abnormal findings: Secondary | ICD-10-CM | POA: Diagnosis not present

## 2022-04-22 DIAGNOSIS — D518 Other vitamin B12 deficiency anemias: Secondary | ICD-10-CM | POA: Diagnosis not present

## 2022-04-22 DIAGNOSIS — E039 Hypothyroidism, unspecified: Secondary | ICD-10-CM | POA: Diagnosis not present

## 2022-04-22 DIAGNOSIS — E559 Vitamin D deficiency, unspecified: Secondary | ICD-10-CM | POA: Diagnosis not present

## 2022-05-14 DIAGNOSIS — E6609 Other obesity due to excess calories: Secondary | ICD-10-CM | POA: Diagnosis not present

## 2022-05-14 DIAGNOSIS — E2749 Other adrenocortical insufficiency: Secondary | ICD-10-CM | POA: Diagnosis not present

## 2022-05-14 DIAGNOSIS — N952 Postmenopausal atrophic vaginitis: Secondary | ICD-10-CM | POA: Diagnosis not present

## 2022-05-14 DIAGNOSIS — N390 Urinary tract infection, site not specified: Secondary | ICD-10-CM | POA: Diagnosis not present

## 2022-05-14 DIAGNOSIS — M5030 Other cervical disc degeneration, unspecified cervical region: Secondary | ICD-10-CM | POA: Diagnosis not present

## 2022-05-14 DIAGNOSIS — I10 Essential (primary) hypertension: Secondary | ICD-10-CM | POA: Diagnosis not present

## 2022-05-14 DIAGNOSIS — M5136 Other intervertebral disc degeneration, lumbar region: Secondary | ICD-10-CM | POA: Diagnosis not present

## 2022-05-14 DIAGNOSIS — B372 Candidiasis of skin and nail: Secondary | ICD-10-CM | POA: Diagnosis not present

## 2022-05-14 DIAGNOSIS — I700 Atherosclerosis of aorta: Secondary | ICD-10-CM | POA: Diagnosis not present

## 2022-05-14 DIAGNOSIS — Z6828 Body mass index (BMI) 28.0-28.9, adult: Secondary | ICD-10-CM | POA: Diagnosis not present

## 2022-05-14 DIAGNOSIS — G894 Chronic pain syndrome: Secondary | ICD-10-CM | POA: Diagnosis not present

## 2022-06-14 DIAGNOSIS — Z6829 Body mass index (BMI) 29.0-29.9, adult: Secondary | ICD-10-CM | POA: Diagnosis not present

## 2022-06-14 DIAGNOSIS — M5030 Other cervical disc degeneration, unspecified cervical region: Secondary | ICD-10-CM | POA: Diagnosis not present

## 2022-06-14 DIAGNOSIS — E663 Overweight: Secondary | ICD-10-CM | POA: Diagnosis not present

## 2022-06-14 DIAGNOSIS — R7309 Other abnormal glucose: Secondary | ICD-10-CM | POA: Diagnosis not present

## 2022-06-14 DIAGNOSIS — E2749 Other adrenocortical insufficiency: Secondary | ICD-10-CM | POA: Diagnosis not present

## 2022-06-14 DIAGNOSIS — G894 Chronic pain syndrome: Secondary | ICD-10-CM | POA: Diagnosis not present

## 2022-06-14 DIAGNOSIS — I10 Essential (primary) hypertension: Secondary | ICD-10-CM | POA: Diagnosis not present

## 2022-06-14 DIAGNOSIS — N390 Urinary tract infection, site not specified: Secondary | ICD-10-CM | POA: Diagnosis not present

## 2022-07-07 ENCOUNTER — Encounter: Payer: Self-pay | Admitting: Gastroenterology

## 2022-08-03 ENCOUNTER — Ambulatory Visit: Payer: PPO | Admitting: Internal Medicine

## 2022-08-10 ENCOUNTER — Encounter: Payer: Self-pay | Admitting: Internal Medicine

## 2022-08-10 ENCOUNTER — Other Ambulatory Visit: Payer: Self-pay

## 2022-08-10 ENCOUNTER — Ambulatory Visit: Payer: PPO | Admitting: Internal Medicine

## 2022-08-10 VITALS — BP 125/71 | HR 90 | Temp 97.8°F | Ht 62.0 in | Wt 156.6 lb

## 2022-08-10 DIAGNOSIS — K589 Irritable bowel syndrome without diarrhea: Secondary | ICD-10-CM | POA: Diagnosis not present

## 2022-08-10 DIAGNOSIS — K219 Gastro-esophageal reflux disease without esophagitis: Secondary | ICD-10-CM | POA: Diagnosis not present

## 2022-08-10 DIAGNOSIS — K743 Primary biliary cirrhosis: Secondary | ICD-10-CM

## 2022-08-10 NOTE — Patient Instructions (Addendum)
It was good to see you again today!  Glad you are swallowing better.  May continue rabeprazole or Aciphex 40 mg each morning 30 minutes before breakfast  Continue Urso.  C-Met today to check liver enzymes  You should take a vitamin supplement daily that contains the fat-soluble vitamins (DEA K)  We only received an indication that bone density was done 2 years ago we do not have the actual report.  We will work on obtaining it.  Stress management as discussed.  Regular exercise.  Office visit with [place] in 6 months

## 2022-08-10 NOTE — Progress Notes (Unsigned)
Primary Care Physician:  Elfredia Nevins, MD Primary Gastroenterologist:  Dr. Jena Gauss  Pre-Procedure History & Physical: HPI:  Laura [name] is a 67 y.o. female here for follow-up of PBC, IBS and GERD.  IBS quiescent now after course of Xifaxan previously.  GERD well-controlled on rabeprazole 40 mg in the morning 30 minutes for breakfast.  She splits it up it exacerbates her symptoms.  No dysphagia since she underwent esophageal dilation.  Liver enzymes completely normal a year ago on Urso.  Low elastography score.  Morphology liver look good on prior CT.  Overall she is doing well.  She is not taking a fat soluble vitamin supplement.  Reportedly had a bone density study couple years ago.  We got a report that it was done but the actual report is still lacking in her file.  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)    GERD (gastroesophageal reflux disease)    H/O hiatal hernia    Hepatitis B antibody positive    letter from red cross, Hep B core total ab positive   History of melanoma excision    01-05-2014   Hypertension    IBS (irritable bowel syndrome)    Nocturnal hypoxia    OA (osteoarthritis) of knee    RIGHT   RLS (restless legs syndrome)    Rotator cuff impingement syndrome of left shoulder    Shortness of breath dyspnea    Unspecified hereditary and idiopathic peripheral neuropathy     Past Surgical History:  Procedure Laterality Date   ABDOMINAL HYSTERECTOMY     ANTERIOR CERVICAL DECOMP/DISCECTOMY FUSION  2000   C4 -- C5   BIOPSY  08/11/2015   Procedure: BIOPSY;  Surgeon: Corbin Ade, MD;  [facility];  Service: Endoscopy;;  gastric polyp;   BIOPSY  06/03/2021   Procedure: BIOPSY;  Surgeon: Corbin Ade, MD;  [facility]: [facility];  Service: Endoscopy;;   cardiac cath     unremarkable   CARPAL TUNNEL RELEASE Right 1995   CHOLECYSTECTOMY  1996    COLONOSCOPY  07/2007   Dr. Jena Gauss: Terminal ileum normal. Status post segmental colonic mucosal biopsy.   COLONOSCOPY WITH PROPOFOL N/A 08/11/2015   normal colonoscopy with benign colonic mucosa   COLONOSCOPY WITH PROPOFOL N/A 06/03/2021   one 4 mm polyps in sigmoid, normal TI, segmental biopsies taken. Hyperplastic polyp. Benign colonic biopsies.   ESOPHAGOGASTRODUODENOSCOPY  07/2007   Dr. Jena Gauss: Small hiatal hernia, duodenal biopsy unremarkable. Fundic gland gastric polyps.   ESOPHAGOGASTRODUODENOSCOPY (EGD) WITH PROPOFOL N/A 08/11/2015   LA Grade A esophagitis, small hiatal hernia, few gastric polyps, normal duodenum (benign fundic gland polyp)   ESOPHAGOGASTRODUODENOSCOPY (EGD) WITH PROPOFOL N/A 06/03/2021   normal esophagus s/p dilation, abnormal gastric mucosa, small hiatal hernia, path with chronic gastritis, negative H.pylori.   KNEE ARTHROSCOPY WITH MEDIAL MENISECTOMY Right 05/25/2013   Procedure: RIGHT KNEE ARTHROSCOPY WITH PARTIAL LATERAL and MEDIAL MENISECTOMY AND CHONDROPLASTY;  Surgeon: Eugenia Mcalpine, MD;  Location: [facility];  Service: Orthopedics;  Laterality: Right;   LUMBAR DISC SURGERY  08/22/2003   LEFT  L5  --  S1   MALONEY DILATION N/A 06/03/2021   Procedure: Elease Hashimoto DILATION;  Surgeon: Corbin Ade, MD;  [facility];  Service: Endoscopy;  Laterality: N/A;   MOHS SURGERY  01/05/2014   LEFT SIDE OF FACE  POLYPECTOMY  06/03/2021   Procedure: POLYPECTOMY;  Surgeon: Corbin Ade, MD;  [facility];  Service: Endoscopy;;   SHOULDER ARTHROSCOPY WITH SUBACROMIAL DECOMPRESSION Left 01/29/2014   Procedure: LEFT SHOULDER ARTHROSCOPY WITH SUBACROMIAL DECOMPRESSION/DISTAL CLAVICLE RESECTION AND DEBRIDEMENT;  Surgeon: Eugenia Mcalpine, MD;  Location: [facility] [place];  Service: Orthopedics;  Laterality: Left;   TOTAL ABDOMINAL HYSTERECTOMY W/ BILATERAL SALPINGOOPHORECTOMY  1999    Prior to Admission medications   Medication  Sig Start Date End Date Taking? Authorizing Provider  ALPRAZolam Prudy Feeler) 0.5 MG tablet Take 0.5 mg by mouth 3 (three) times daily as needed for anxiety. 05/01/16  Yes [provider]  Cyanocobalamin (VITAMIN B-12 IJ) Inject as directed every 30 (thirty) days.   Yes [provider]  estradiol (ESTRACE) 0.1 MG/GM vaginal cream Apply 1/2 " per vagina 2 nights/week 03/23/22  Yes Dahlstedt, Jeannett Senior, MD  hydrochlorothiazide (MICROZIDE) 12.5 MG capsule Take 12.5 mg by mouth every morning.  07/23/12  Yes [provider]  HYDROcodone-acetaminophen (NORCO) 10-325 MG tablet Take 0.5-1 tablets by mouth every 6 (six) hours as needed for severe pain. 02/19/20  Yes [provider]  hydrocortisone (CORTEF) 5 MG tablet Take 10 mg by mouth 2 (two) times daily. 02/11/21  Yes [provider]  losartan (COZAAR) 100 MG tablet Take 100 mg by mouth daily. 08/02/22  Yes [provider]  Multiple Vitamin (MULTIVITAMIN WITH MINERALS) TABS tablet Take 1 tablet by mouth daily.   Yes [provider]  Omega-3 Fatty Acids (FISH OIL PO) Take 2 capsules by mouth in the morning and at bedtime.   Yes [provider]  RABEprazole (ACIPHEX) 20 MG tablet TAKE 1 TABLET BY MOUTH TWICE DAILY 04/22/22  Yes Gelene Mink, NP  rOPINIRole (REQUIP) 0.5 MG tablet Take 0.5 mg by mouth at bedtime. 02/11/20  Yes [provider]  traZODone (DESYREL) 150 MG tablet Take 150 mg by mouth at bedtime. 03/19/19  Yes [provider]  ursodiol (ACTIGALL) 500 MG tablet TAKE 1 TABLET BY MOUTH TWICE DAILY 03/16/22  Yes Gelene Mink, NP    Allergies as of 08/10/2022 - Review Complete 08/10/2022  Allergen Reaction Noted   Aspirin Other (See Comments) 06/16/2015   Celebrex [celecoxib] Hives 07/27/2012   Effexor [venlafaxine] Other (See Comments) 02/16/2021    Family History  Problem Relation Age of Onset   Heart attack Mother    Lung cancer Mother    HIV/AIDS Father     Sjogren's syndrome Sister    Diabetes Brother    Breast cancer Maternal Aunt    Heart attack Daughter    Drug abuse Son    ADD / ADHD Grandchild    Crohn's disease Cousin    Colon cancer Neg Hx    Colon polyps Neg Hx     Social History   Socioeconomic History   Marital status: Married    Spouse name: Laura [name]   Number of children: 3   Years of education: GED   Highest education level: Not on file  Occupational History    Comment: not employed  Tobacco Use   Smoking status: Former    Packs/day: 1.00    Years: 20.00    Additional pack years: 0.00    Total pack years: 20.00    Types: Cigarettes    Quit date: 11/2020    Years since quitting: 1.7   Smokeless tobacco: Never   Tobacco comments:    smokes 3 cigarettes per day-- 02/29/2020  Vaping  Use   Vaping Use: Never used  Substance and Sexual Activity   Alcohol use: Yes    Comment: rare   Drug use: No   Sexual activity: Yes  Other Topics Concern   Not on file  Social History Narrative   Patient lives at home with her husband Laura [name]).    Patient is not working as time but she trying for disability.   Education 10th grade   Caffeine daily coffee and mountain dew . One can daily.   .      Social Determinants of Health   Financial Resource Strain: Not on file  Food Insecurity: Not on file  Transportation Needs: Not on file  Physical Activity: Not on file  Stress: Not on file  Social Connections: Not on file  Intimate Partner Violence: Not on file    Review of Systems: See HPI, otherwise negative ROS  Physical Exam: BP 125/71 (BP Location: Right Arm, Patient Position: Sitting, Cuff Size: Normal)   Pulse 90   Temp 97.8 F (36.6 C) (Oral)   Ht 5\' 2"  (1.575 m)   Wt 156 lb 9.6 oz (71 kg)   SpO2 96%   BMI 28.64 kg/m  General:   Alert,  Well-developed, well-nourished, pleasant and cooperative in NAD Neck:  Supple; no masses or thyromegaly. No significant cervical adenopathy. Lungs:  Clear throughout to  auscultation.   No wheezes, crackles, or rhonchi. No acute distress. Heart:  Regular rate and rhythm; no murmurs, clicks, rubs,  or gallops. Abdomen: Non-distended, normal bowel sounds.  Soft and nontender without appreciable mass or hepatosplenomegaly.   Impression/Plan:    1.  Mixed IBS -symptoms quiescent for now.  May benefit from a another round of Xifaxan in the future  2.  GERD well-controlled on rabeprazole 40 mg each morning.  3.  PBC without evidence of advanced liver disease.  Normal liver enzymes on Urso.  Needs updated labs.  4.  Need to verify bone density results done 2 years ago.  5.  Fat-soluble vitamin supplement -taking vitamins DEA and K  5.  This visit here in 6 months       Notice: This dictation was prepared with Dragon dictation along with smaller phrase technology. Any transcriptional errors that result from this process are unintentional and may not be corrected upon review.

## 2022-08-11 ENCOUNTER — Telehealth: Payer: Self-pay

## 2022-08-11 NOTE — Telephone Encounter (Signed)
Bone density report has been obtained and is scanned under media for review.

## 2022-08-13 ENCOUNTER — Telehealth: Payer: Self-pay | Admitting: Internal Medicine

## 2022-08-13 DIAGNOSIS — G4733 Obstructive sleep apnea (adult) (pediatric): Secondary | ICD-10-CM | POA: Diagnosis not present

## 2022-08-13 DIAGNOSIS — M5030 Other cervical disc degeneration, unspecified cervical region: Secondary | ICD-10-CM | POA: Diagnosis not present

## 2022-08-13 DIAGNOSIS — M5136 Other intervertebral disc degeneration, lumbar region: Secondary | ICD-10-CM | POA: Diagnosis not present

## 2022-08-13 DIAGNOSIS — K743 Primary biliary cirrhosis: Secondary | ICD-10-CM | POA: Diagnosis not present

## 2022-08-13 DIAGNOSIS — I10 Essential (primary) hypertension: Secondary | ICD-10-CM | POA: Diagnosis not present

## 2022-08-13 DIAGNOSIS — F330 Major depressive disorder, recurrent, mild: Secondary | ICD-10-CM | POA: Diagnosis not present

## 2022-08-13 DIAGNOSIS — Z6827 Body mass index (BMI) 27.0-27.9, adult: Secondary | ICD-10-CM | POA: Diagnosis not present

## 2022-08-13 DIAGNOSIS — G894 Chronic pain syndrome: Secondary | ICD-10-CM | POA: Diagnosis not present

## 2022-08-13 DIAGNOSIS — K219 Gastro-esophageal reflux disease without esophagitis: Secondary | ICD-10-CM | POA: Diagnosis not present

## 2022-08-13 DIAGNOSIS — I700 Atherosclerosis of aorta: Secondary | ICD-10-CM | POA: Diagnosis not present

## 2022-08-13 DIAGNOSIS — E663 Overweight: Secondary | ICD-10-CM | POA: Diagnosis not present

## 2022-08-13 DIAGNOSIS — E2749 Other adrenocortical insufficiency: Secondary | ICD-10-CM | POA: Diagnosis not present

## 2022-08-13 NOTE — Telephone Encounter (Signed)
Reviewed bone density study from January 2022.  Patient at least osteopenic then.  She needs a repeat bone density study now.  Should be on a calcium vitamin D supplement.  May need to be on a prescription agent for osteoporosis.  Lets look at the updated bone density study when it becomes available and we will go from there.  Laura [name], please order.

## 2022-08-14 LAB — COMPREHENSIVE METABOLIC PANEL
ALT: 13 IU/L (ref 0–32)
AST: 12 IU/L (ref 0–40)
Albumin: 4.5 g/dL (ref 3.9–4.9)
Alkaline Phosphatase: 101 IU/L (ref 44–121)
BUN/Creatinine Ratio: 19 (ref 12–28)
BUN: 17 mg/dL (ref 8–27)
Bilirubin Total: 0.5 mg/dL (ref 0.0–1.2)
CO2: 26 mmol/L (ref 20–29)
Calcium: 9.7 mg/dL (ref 8.7–10.3)
Chloride: 100 mmol/L (ref 96–106)
Creatinine, Ser: 0.9 mg/dL (ref 0.57–1.00)
Globulin, Total: 2.3 g/dL (ref 1.5–4.5)
Glucose: 101 mg/dL — ABNORMAL HIGH (ref 70–99)
Potassium: 4.1 mmol/L (ref 3.5–5.2)
Sodium: 140 mmol/L (ref 134–144)
Total Protein: 6.8 g/dL (ref 6.0–8.5)
eGFR: 70 mL/min/{1.73_m2} (ref >59)

## 2022-08-16 ENCOUNTER — Other Ambulatory Visit: Payer: Self-pay

## 2022-08-16 ENCOUNTER — Other Ambulatory Visit: Payer: Self-pay | Admitting: *Deleted

## 2022-08-16 DIAGNOSIS — M8580 Other specified disorders of bone density and structure, unspecified site: Secondary | ICD-10-CM

## 2022-08-16 DIAGNOSIS — K743 Primary biliary cirrhosis: Secondary | ICD-10-CM

## 2022-08-16 NOTE — Telephone Encounter (Signed)
Pt informed that bone density study is scheduled for 08/24/22, arrive at 12:45 pm to check in, stop any medication with calcium 48 hours prior, take list of medications and wear comfortable 2 piece clothing. Pt verbalized understanding.

## 2022-08-16 NOTE — Telephone Encounter (Signed)
Pt was made aware and is ready to move forward with scheduling bone density study.

## 2022-08-24 ENCOUNTER — Other Ambulatory Visit (HOSPITAL_COMMUNITY): Payer: PPO

## 2022-09-21 ENCOUNTER — Ambulatory Visit: Payer: PPO | Admitting: Urology

## 2022-09-21 DIAGNOSIS — N3000 Acute cystitis without hematuria: Secondary | ICD-10-CM

## 2022-10-08 DIAGNOSIS — I10 Essential (primary) hypertension: Secondary | ICD-10-CM | POA: Diagnosis not present

## 2022-10-08 DIAGNOSIS — F419 Anxiety disorder, unspecified: Secondary | ICD-10-CM | POA: Diagnosis not present

## 2022-10-08 DIAGNOSIS — J9611 Chronic respiratory failure with hypoxia: Secondary | ICD-10-CM | POA: Diagnosis not present

## 2022-10-08 DIAGNOSIS — Z6827 Body mass index (BMI) 27.0-27.9, adult: Secondary | ICD-10-CM | POA: Diagnosis not present

## 2022-10-08 DIAGNOSIS — E663 Overweight: Secondary | ICD-10-CM | POA: Diagnosis not present

## 2022-10-08 DIAGNOSIS — K219 Gastro-esophageal reflux disease without esophagitis: Secondary | ICD-10-CM | POA: Diagnosis not present

## 2022-10-08 DIAGNOSIS — M5136 Other intervertebral disc degeneration, lumbar region: Secondary | ICD-10-CM | POA: Diagnosis not present

## 2022-10-08 DIAGNOSIS — G4733 Obstructive sleep apnea (adult) (pediatric): Secondary | ICD-10-CM | POA: Diagnosis not present

## 2022-10-08 DIAGNOSIS — E2749 Other adrenocortical insufficiency: Secondary | ICD-10-CM | POA: Diagnosis not present

## 2022-10-18 ENCOUNTER — Other Ambulatory Visit: Payer: Self-pay | Admitting: Gastroenterology

## 2022-12-13 DIAGNOSIS — Z23 Encounter for immunization: Secondary | ICD-10-CM | POA: Diagnosis not present

## 2022-12-13 DIAGNOSIS — M5030 Other cervical disc degeneration, unspecified cervical region: Secondary | ICD-10-CM | POA: Diagnosis not present

## 2022-12-13 DIAGNOSIS — M5134 Other intervertebral disc degeneration, thoracic region: Secondary | ICD-10-CM | POA: Diagnosis not present

## 2022-12-13 DIAGNOSIS — Z6827 Body mass index (BMI) 27.0-27.9, adult: Secondary | ICD-10-CM | POA: Diagnosis not present

## 2022-12-13 DIAGNOSIS — E663 Overweight: Secondary | ICD-10-CM | POA: Diagnosis not present

## 2022-12-13 DIAGNOSIS — E2749 Other adrenocortical insufficiency: Secondary | ICD-10-CM | POA: Diagnosis not present

## 2022-12-13 DIAGNOSIS — I10 Essential (primary) hypertension: Secondary | ICD-10-CM | POA: Diagnosis not present

## 2022-12-13 DIAGNOSIS — G4733 Obstructive sleep apnea (adult) (pediatric): Secondary | ICD-10-CM | POA: Diagnosis not present

## 2023-01-18 DIAGNOSIS — A493 Mycoplasma infection, unspecified site: Secondary | ICD-10-CM | POA: Diagnosis not present

## 2023-01-31 ENCOUNTER — Other Ambulatory Visit: Payer: Self-pay

## 2023-01-31 DIAGNOSIS — K743 Primary biliary cirrhosis: Secondary | ICD-10-CM

## 2023-02-08 ENCOUNTER — Encounter: Payer: Self-pay | Admitting: Gastroenterology

## 2023-02-08 ENCOUNTER — Ambulatory Visit: Payer: PPO | Admitting: Gastroenterology

## 2023-02-11 DIAGNOSIS — G894 Chronic pain syndrome: Secondary | ICD-10-CM | POA: Diagnosis not present

## 2023-02-11 DIAGNOSIS — K219 Gastro-esophageal reflux disease without esophagitis: Secondary | ICD-10-CM | POA: Diagnosis not present

## 2023-02-11 DIAGNOSIS — Z6828 Body mass index (BMI) 28.0-28.9, adult: Secondary | ICD-10-CM | POA: Diagnosis not present

## 2023-02-11 DIAGNOSIS — M5134 Other intervertebral disc degeneration, thoracic region: Secondary | ICD-10-CM | POA: Diagnosis not present

## 2023-02-11 DIAGNOSIS — I10 Essential (primary) hypertension: Secondary | ICD-10-CM | POA: Diagnosis not present

## 2023-02-11 DIAGNOSIS — E6609 Other obesity due to excess calories: Secondary | ICD-10-CM | POA: Diagnosis not present

## 2023-02-11 DIAGNOSIS — I700 Atherosclerosis of aorta: Secondary | ICD-10-CM | POA: Diagnosis not present

## 2023-02-11 DIAGNOSIS — F330 Major depressive disorder, recurrent, mild: Secondary | ICD-10-CM | POA: Diagnosis not present

## 2023-02-11 DIAGNOSIS — E2749 Other adrenocortical insufficiency: Secondary | ICD-10-CM | POA: Diagnosis not present

## 2023-02-11 DIAGNOSIS — G4733 Obstructive sleep apnea (adult) (pediatric): Secondary | ICD-10-CM | POA: Diagnosis not present

## 2023-03-22 DIAGNOSIS — I10 Essential (primary) hypertension: Secondary | ICD-10-CM | POA: Diagnosis not present

## 2023-03-22 DIAGNOSIS — E2749 Other adrenocortical insufficiency: Secondary | ICD-10-CM | POA: Diagnosis not present

## 2023-03-22 DIAGNOSIS — R5383 Other fatigue: Secondary | ICD-10-CM | POA: Diagnosis not present

## 2023-03-23 LAB — LAB REPORT - SCANNED: EGFR: 66

## 2023-04-11 DIAGNOSIS — M5134 Other intervertebral disc degeneration, thoracic region: Secondary | ICD-10-CM | POA: Diagnosis not present

## 2023-04-11 DIAGNOSIS — E663 Overweight: Secondary | ICD-10-CM | POA: Diagnosis not present

## 2023-04-11 DIAGNOSIS — I10 Essential (primary) hypertension: Secondary | ICD-10-CM | POA: Diagnosis not present

## 2023-04-11 DIAGNOSIS — Z6827 Body mass index (BMI) 27.0-27.9, adult: Secondary | ICD-10-CM | POA: Diagnosis not present

## 2023-04-11 DIAGNOSIS — Z1331 Encounter for screening for depression: Secondary | ICD-10-CM | POA: Diagnosis not present

## 2023-04-11 DIAGNOSIS — E2749 Other adrenocortical insufficiency: Secondary | ICD-10-CM | POA: Diagnosis not present

## 2023-04-11 DIAGNOSIS — G4733 Obstructive sleep apnea (adult) (pediatric): Secondary | ICD-10-CM | POA: Diagnosis not present

## 2023-04-11 DIAGNOSIS — Z9229 Personal history of other drug therapy: Secondary | ICD-10-CM | POA: Diagnosis not present

## 2023-04-11 DIAGNOSIS — N390 Urinary tract infection, site not specified: Secondary | ICD-10-CM | POA: Diagnosis not present

## 2023-04-11 DIAGNOSIS — Z0001 Encounter for general adult medical examination with abnormal findings: Secondary | ICD-10-CM | POA: Diagnosis not present

## 2023-04-19 ENCOUNTER — Encounter: Payer: Self-pay | Admitting: Internal Medicine

## 2023-04-19 ENCOUNTER — Ambulatory Visit: Admitting: Internal Medicine

## 2023-04-19 VITALS — BP 126/71 | HR 82 | Temp 98.0°F | Ht 61.0 in | Wt 149.4 lb

## 2023-04-19 DIAGNOSIS — Z0001 Encounter for general adult medical examination with abnormal findings: Secondary | ICD-10-CM | POA: Diagnosis not present

## 2023-04-19 DIAGNOSIS — K219 Gastro-esophageal reflux disease without esophagitis: Secondary | ICD-10-CM | POA: Diagnosis not present

## 2023-04-19 DIAGNOSIS — K743 Primary biliary cirrhosis: Secondary | ICD-10-CM

## 2023-04-19 DIAGNOSIS — K580 Irritable bowel syndrome with diarrhea: Secondary | ICD-10-CM | POA: Diagnosis not present

## 2023-04-19 DIAGNOSIS — Z9229 Personal history of other drug therapy: Secondary | ICD-10-CM | POA: Diagnosis not present

## 2023-04-19 DIAGNOSIS — K529 Noninfective gastroenteritis and colitis, unspecified: Secondary | ICD-10-CM

## 2023-04-19 DIAGNOSIS — E7849 Other hyperlipidemia: Secondary | ICD-10-CM | POA: Diagnosis not present

## 2023-04-19 DIAGNOSIS — M8580 Other specified disorders of bone density and structure, unspecified site: Secondary | ICD-10-CM

## 2023-04-19 NOTE — Progress Notes (Unsigned)
 Primary Care Physician:  Elfredia Nevins, MD Primary Gastroenterologist:  Dr. Jena Gauss  Pre-Procedure History & Physical: HPI:  Laura [name] is a 68 y.o. female here for follow-up of PBC.  Doing very well no pruritus.  LFTs recently indicate bilirubin and alkaline phosphatase well within normal limits.  Transaminases normal.  Low elastography score previously.  Dealing with recurrent urinary tract infections. 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  Predominance of diarrhea.  Responded to 2 courses of Xifaxan for previously. Due for average rescreening colonoscopy 2027. GERD well-controlled on rabeprazole 20 mg twice daily  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)    GERD (gastroesophageal reflux disease)    H/O hiatal hernia    Hepatitis B antibody positive    letter from red cross, Hep B core total ab positive   History of melanoma excision    01-05-2014   Hypertension    IBS (irritable bowel syndrome)    Nocturnal hypoxia    OA (osteoarthritis) of knee    RIGHT   RLS (restless legs syndrome)    Rotator cuff impingement syndrome of left shoulder    Shortness of breath dyspnea    Unspecified hereditary and idiopathic peripheral neuropathy     Past Surgical History:  Procedure Laterality Date   ABDOMINAL HYSTERECTOMY     ANTERIOR CERVICAL DECOMP/DISCECTOMY FUSION  2000   C4 -- C5   BIOPSY  08/11/2015   Procedure: BIOPSY;  Surgeon: Corbin Ade, MD;  [facility];  Service: Endoscopy;;  gastric polyp;   BIOPSY  06/03/2021   Procedure: BIOPSY;  Surgeon: Corbin Ade, MD;  [facility];  Service: Endoscopy;;   cardiac cath     unremarkable   CARPAL TUNNEL RELEASE Right 1995   CHOLECYSTECTOMY  1996   COLONOSCOPY  07/2007   Dr. Jena Gauss: Terminal ileum normal. Status post segmental colonic mucosal biopsy.   COLONOSCOPY  WITH PROPOFOL N/A 08/11/2015   normal colonoscopy with benign colonic mucosa   COLONOSCOPY WITH PROPOFOL N/A 06/03/2021   one 4 mm polyps in sigmoid, normal TI, segmental biopsies taken. Hyperplastic polyp. Benign colonic biopsies.   ESOPHAGOGASTRODUODENOSCOPY  07/2007   Dr. Jena Gauss: Small hiatal hernia, duodenal biopsy unremarkable. Fundic gland gastric polyps.   ESOPHAGOGASTRODUODENOSCOPY (EGD) WITH PROPOFOL N/A 08/11/2015   LA Grade A esophagitis, small hiatal hernia, few gastric polyps, normal duodenum (benign fundic gland polyp)   ESOPHAGOGASTRODUODENOSCOPY (EGD) WITH PROPOFOL N/A 06/03/2021   normal esophagus s/p dilation, abnormal gastric mucosa, small hiatal hernia, path with chronic gastritis, negative H.pylori.   KNEE ARTHROSCOPY WITH MEDIAL MENISECTOMY Right 05/25/2013   Procedure: RIGHT KNEE ARTHROSCOPY WITH PARTIAL LATERAL and MEDIAL MENISECTOMY AND CHONDROPLASTY;  Surgeon: Eugenia Mcalpine, MD;  Location: [facility] [place];  Service: Orthopedics;  Laterality: Right;   LUMBAR DISC SURGERY  08/22/2003   LEFT  L5  --  S1   MALONEY DILATION N/A 06/03/2021   Procedure: Elease Hashimoto DILATION;  Surgeon: Corbin Ade, MD;  [facility];  Service: Endoscopy;  Laterality: N/A;   MOHS SURGERY  01/05/2014   LEFT SIDE OF FACE   POLYPECTOMY  06/03/2021   Procedure: POLYPECTOMY;  Surgeon: Corbin Ade, MD;  [facility];  Service: Endoscopy;;   SHOULDER ARTHROSCOPY WITH SUBACROMIAL DECOMPRESSION Left 01/29/2014   Procedure: LEFT SHOULDER ARTHROSCOPY WITH SUBACROMIAL DECOMPRESSION/DISTAL  CLAVICLE RESECTION AND DEBRIDEMENT;  Surgeon: Eugenia Mcalpine, MD;  Location: [facility];  Service: Orthopedics;  Laterality: Left;   TOTAL ABDOMINAL HYSTERECTOMY W/ BILATERAL SALPINGOOPHORECTOMY  1999    Prior to Admission medications   Medication Sig Start Date End Date Taking? Authorizing Provider  ALPRAZolam Prudy Feeler) 0.5 MG tablet Take 0.5 mg by mouth 3 (three)  times daily as needed for anxiety. 05/01/16  Yes [provider]  Cyanocobalamin (VITAMIN B-12 IJ) Inject as directed every 30 (thirty) days.   Yes [provider]  hydrochlorothiazide (MICROZIDE) 12.5 MG capsule Take 12.5 mg by mouth every morning.  07/23/12  Yes [provider]  HYDROcodone-acetaminophen (NORCO) 10-325 MG tablet Take 0.5-1 tablets by mouth every 6 (six) hours as needed for severe pain. 02/19/20  Yes [provider]  hydrocortisone (CORTEF) 5 MG tablet Take 10 mg by mouth 2 (two) times daily. 02/11/21  Yes [provider]  losartan (COZAAR) 100 MG tablet Take 100 mg by mouth daily. 08/02/22  Yes [provider]  Multiple Vitamin (MULTIVITAMIN WITH MINERALS) TABS tablet Take 1 tablet by mouth daily.   Yes [provider]  nitrofurantoin, macrocrystal-monohydrate, (MACROBID) 100 MG capsule Take 100 mg by mouth daily. 04/11/23  Yes [provider]  Omega-3 Fatty Acids (FISH OIL PO) Take 2 capsules by mouth in the morning and at bedtime.   Yes [provider]  RABEprazole (ACIPHEX) 20 MG tablet TAKE 1 TABLET BY MOUTH TWICE DAILY 04/22/22  Yes Gelene Mink, NP  rOPINIRole (REQUIP) 0.5 MG tablet Take 0.5 mg by mouth at bedtime. 02/11/20  Yes [provider]  traZODone (DESYREL) 150 MG tablet Take 150 mg by mouth at bedtime. 03/19/19  Yes [provider]  ursodiol (ACTIGALL) 500 MG tablet TAKE 1 TABLET BY MOUTH TWICE DAILY 10/18/22  Yes Corbin Ade, MD    Allergies as of 04/19/2023 - Review Complete 04/19/2023  Allergen Reaction Noted   Aspirin Other (See Comments) 06/16/2015   Celebrex [celecoxib] Hives 07/27/2012   Effexor [venlafaxine] Other (See Comments) 02/16/2021    Family History  Problem Relation Age of Onset   Heart attack Mother    Lung cancer Mother    HIV/AIDS Father    Sjogren's syndrome Sister    Diabetes Brother    Breast cancer Maternal Aunt    Heart attack Daughter     Drug abuse Son    ADD / ADHD Grandchild    Crohn's disease Cousin    Colon cancer Neg Hx    Colon polyps Neg Hx     Social History   Socioeconomic History   Marital status: Married    Spouse name: [name]   Number of children: 3   Years of education: GED   Highest education level: Not on file  Occupational History    Comment: not employed  Tobacco Use   Smoking status: Former    Current packs/day: 0.00    Average packs/day: 1 pack/day for 20.0 years (20.0 ttl pk-yrs)    Types: Cigarettes    Start date: 11/2000    Quit date: 11/2020    Years since quitting: 2.4   Smokeless tobacco: Never   Tobacco comments:    smokes 3 cigarettes per day-- 02/29/2020  Vaping Use   Vaping status: Never Used  Substance and Sexual Activity   Alcohol use: Yes    Comment: rare   Drug use: No   Sexual activity: Yes  Other Topics Concern   Not  on file  Social History Narrative   Patient lives at home with her husband Gerlene Burdock).    Patient is not working as time but she trying for disability.   Education 10th grade   Caffeine daily coffee and mountain dew . One can daily.   .      Social Drivers of Corporate investment banker Strain: Not on file  Food Insecurity: Not on file  Transportation Needs: Not on file  Physical Activity: Not on file  Stress: Not on file  Social Connections: Not on file  Intimate Partner Violence: Not on file    Review of Systems: See HPI, otherwise negative ROS  Physical Exam: BP 126/71 (BP Location: Left Arm, Patient Position: Sitting, Cuff Size: Normal)   Pulse 82   Temp 98 F (36.7 C) (Oral)   Ht 5\' 1"  (1.549 m)   Wt 149 lb 6.4 oz (67.8 kg)   SpO2 97%   BMI 28.23 kg/m  General:   Alert,  Well-developed, well-nourished, pleasant and cooperative in NAD Neck:  Supple; no masses or thyromegaly. No significant cervical adenopathy. Lungs:  Clear throughout to auscultation.   No wheezes, crackles, or rhonchi. No acute distress. Heart:  Regular rate  and rhythm; no murmurs, clicks, rubs,  or gallops. Abdomen: Non-distended, normal bowel sounds.  Soft and nontender without appreciable mass or hepatosplenomegaly.   Impression/Plan: PBC well-controlled enzymes and bilirubin to goal.  Low elastography score previously. Low bone density on 2022 bone density study.  This needs to be repeated now. Continue weight-based Urso  Continue fat-soluble vitamin supplementation  GERD well-controlled on twice daily omeprazole-continue  Diarrhea predominant IBS another round of Xifaxan 550 milligrams 3 times daily x 14 days  Office visit here in 9 months.     Notice: This dictation was prepared with Dragon dictation along with smaller phrase technology. Any transcriptional errors that result from this process are unintentional and may not be corrected upon review.

## 2023-04-19 NOTE — Patient Instructions (Addendum)
 It was good to see you again today!  Continue daily multivitamin supplement to include fat-soluble vitamins DEA and K  Continue Urso daily indefinitely  Repeat course of Xifaxan 550 mg tablet 3 times a day x 14 days.  Low bone mass on bone density study 3 years ago.  That should be repeated now.  Further recommendations to follow.  Office visit here in 9 months

## 2023-04-20 NOTE — Progress Notes (Signed)
 Name: Laura [name] DOB: [birth year] MRN: [id number]  History of Present Illness: Laura [name] is a 68 y.o. female who presents today for follow up visit at [facility] [place].  - GU history: 1. Microscopic hematuria (resolved).  - Negative evaluation in 2015 and 2017.  2. Vaginal atrophy.  At last visit with Dr. Retta Diones on 03/23/2022: - Seen for follow up of possible recurrent UTIs; no positive urine cultures in recent past. Asymptomatic. Discussed probable asymptomatic bacteriuria, which patient was cautioned not to treat.  - Chronic back pain not attributed to GU etiology. - The plan was:  1.  Patient cautioned not to take antibiotics for asymptomatic bacteriuria 2.  I will start her on estrogen cream 3.  Take probiotics after antibiotic use 4.  Start cranberry capsules daily 5.  I will see back in 6 months  Since last visit:  > 04/11/2023:  - Seen by PCP.  - Reported concern for UTI due to bilateral "kidney pain"  - UA: positive for glucose (100), trace leukocytes, trace blood; no nitrites. - No urine culture done. - Bactrim prescribed for suspected UTI. - Macrobid (Nitrofurantoin) 100 mg daily for UTI prophylaxis was discontinued (as prescribed by PCP).  > Her renal labs have been normal.   > No positive urine culture results in past 12 months found per chart review.  Today: She denies acute UTI symptoms today. She reports that UTI symptoms typically include malodorous urine, "my kidneys hurt", gets increased urgency with dribbling, and occasionally has accompanying dysuria.   She denies using vaginal estrogen cream routinely; she denies being sexually active so she thought it wasn't needed.  She denies history of kidney stones. Has chronic back pain and peripheral neuropathy.  She reports that her PCP told her that she has cystocele. She denies bothersome vaginal bulge today.   Medications: Current Outpatient Medications  Medication Sig Dispense Refill    ALPRAZolam (XANAX) 0.5 MG tablet Take 0.5 mg by mouth 3 (three) times daily as needed for anxiety.     Cyanocobalamin (VITAMIN B-12 IJ) Inject as directed every 30 (thirty) days.     estradiol (ESTRACE) 0.1 MG/GM vaginal cream Discard plastic applicator. Insert a blueberry size amount (approximately 1 gram) of cream on fingertip inside vagina at bedtime every night for 1 week then every other night. For long term use. 30 g 3   hydrochlorothiazide (MICROZIDE) 12.5 MG capsule Take 12.5 mg by mouth every morning.      HYDROcodone-acetaminophen (NORCO) 10-325 MG tablet Take 0.5-1 tablets by mouth every 6 (six) hours as needed for severe pain.     hydrocortisone (CORTEF) 5 MG tablet Take 10 mg by mouth 2 (two) times daily.     losartan (COZAAR) 100 MG tablet Take 100 mg by mouth daily.     Multiple Vitamin (MULTIVITAMIN WITH MINERALS) TABS tablet Take 1 tablet by mouth daily.     Omega-3 Fatty Acids (FISH OIL PO) Take 2 capsules by mouth in the morning and at bedtime.     RABEprazole (ACIPHEX) 20 MG tablet TAKE 1 TABLET BY MOUTH TWICE DAILY 180 tablet 3   rOPINIRole (REQUIP) 0.5 MG tablet Take 0.5 mg by mouth at bedtime.     traZODone (DESYREL) 150 MG tablet Take 150 mg by mouth at bedtime.     ursodiol (ACTIGALL) 500 MG tablet TAKE 1 TABLET BY MOUTH TWICE DAILY 60 tablet 11   No current facility-administered medications for this visit.    Allergies: Allergies  Allergen Reactions  Aspirin Other (See Comments)    Severe stomach upset due to IBS.   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, Hep B core total ab positive   History of melanoma excision    01-05-2014   Hypertension    IBS  (irritable bowel syndrome)    Nocturnal hypoxia    OA (osteoarthritis) of knee    RIGHT   RLS (restless legs syndrome)    Rotator cuff impingement syndrome of left shoulder    Shortness of breath dyspnea    Unspecified hereditary and idiopathic peripheral neuropathy    Past Surgical History:  Procedure Laterality Date   ABDOMINAL HYSTERECTOMY     ANTERIOR CERVICAL DECOMP/DISCECTOMY FUSION  2000   C4 -- C5   BIOPSY  08/11/2015   Procedure: BIOPSY;  Surgeon: Corbin Ade, MD;  [facility]: [facility];  Service: Endoscopy;;  gastric polyp;   BIOPSY  06/03/2021   Procedure: BIOPSY;  Surgeon: Corbin Ade, MD;  Location: [facility];  Service: Endoscopy;;   cardiac cath     unremarkable   CARPAL TUNNEL RELEASE Right 1995   CHOLECYSTECTOMY  1996   COLONOSCOPY  07/2007   Dr. Jena Gauss: Terminal ileum normal. Status post segmental colonic mucosal biopsy.   COLONOSCOPY WITH PROPOFOL N/A 08/11/2015   normal colonoscopy with benign colonic mucosa   COLONOSCOPY WITH PROPOFOL N/A 06/03/2021   one 4 mm polyps in sigmoid, normal TI, segmental biopsies taken. Hyperplastic polyp. Benign colonic biopsies.   ESOPHAGOGASTRODUODENOSCOPY  07/2007   Dr. Jena Gauss: Small hiatal hernia, duodenal biopsy unremarkable. Fundic gland gastric polyps.   ESOPHAGOGASTRODUODENOSCOPY (EGD) WITH PROPOFOL N/A 08/11/2015   LA Grade A esophagitis, small hiatal hernia, few gastric polyps, normal duodenum (benign fundic gland polyp)   ESOPHAGOGASTRODUODENOSCOPY (EGD) WITH PROPOFOL N/A 06/03/2021   normal esophagus s/p dilation, abnormal gastric mucosa, small hiatal hernia, path with chronic gastritis, negative H.pylori.   KNEE ARTHROSCOPY WITH MEDIAL MENISECTOMY Right 05/25/2013   Procedure: RIGHT KNEE ARTHROSCOPY WITH PARTIAL LATERAL and MEDIAL MENISECTOMY AND CHONDROPLASTY;  Surgeon: Eugenia Mcalpine, MD;  Location: [facility] [place];  Service: Orthopedics;  Laterality: Right;   LUMBAR DISC SURGERY  08/22/2003    LEFT  L5  --  S1   MALONEY DILATION N/A 06/03/2021   Procedure: Elease Hashimoto DILATION;  Surgeon: Corbin Ade, MD;  Location: [facility];  Service: Endoscopy;  Laterality: N/A;   MOHS SURGERY  01/05/2014   LEFT SIDE OF FACE   POLYPECTOMY  06/03/2021   Procedure: POLYPECTOMY;  Surgeon: Corbin Ade, MD;  Location: [facility];  Service: Endoscopy;;   SHOULDER ARTHROSCOPY WITH SUBACROMIAL DECOMPRESSION Left 01/29/2014   Procedure: LEFT SHOULDER ARTHROSCOPY WITH SUBACROMIAL DECOMPRESSION/DISTAL CLAVICLE RESECTION AND DEBRIDEMENT;  Surgeon: Eugenia Mcalpine, MD;  Location: [facility];  Service: Orthopedics;  Laterality: Left;   TOTAL ABDOMINAL HYSTERECTOMY W/ BILATERAL SALPINGOOPHORECTOMY  1999   Family History  Problem Relation Age of Onset   Heart attack Mother    Lung cancer Mother    HIV/AIDS Father    Sjogren's syndrome Sister    Diabetes Brother  Breast cancer Maternal Aunt    Heart attack Daughter    Drug abuse Son    ADD / ADHD Grandchild    Crohn's disease Cousin    Colon cancer Neg Hx    Colon polyps Neg Hx    Social History   Socioeconomic History   Marital status: Married    Spouse name: [name]   Number of children: 3   Years of education: GED   Highest education level: Not on file  Occupational History    Comment: not employed  Tobacco Use   Smoking status: Former    Current packs/day: 0.00    Average packs/day: 1 pack/day for 20.0 years (20.0 ttl pk-yrs)    Types: Cigarettes    Start date: 11/2000    Quit date: 11/2020    Years since quitting: 2.4   Smokeless tobacco: Never   Tobacco comments:    smokes 3 cigarettes per day-- 02/29/2020  Vaping Use   Vaping status: Never Used  Substance and Sexual Activity   Alcohol use: Yes    Comment: rare   Drug use: No   Sexual activity: Yes  Other Topics Concern   Not on file  Social History Narrative   Patient lives at home with her husband Gerlene Burdock).    Patient is not working as time but  she trying for disability.   Education 10th grade   Caffeine daily coffee and mountain dew . One can daily.   .      Social Drivers of Corporate investment banker Strain: Not on file  Food Insecurity: Not on file  Transportation Needs: Not on file  Physical Activity: Not on file  Stress: Not on file  Social Connections: Not on file  Intimate Partner Violence: Not on file    Review of Systems Constitutional: Patient denies any unintentional weight loss or change in strength lntegumentary: Patient denies any rashes or pruritus Cardiovascular: Patient denies chest pain or syncope Respiratory: Patient denies shortness of breath Gastrointestinal: Patient reports alternating constipation / diarrhea (IBS) Musculoskeletal: Patient denies muscle cramps or weakness Neurologic: Patient denies convulsions or seizures Allergic/Immunologic: Patient denies recent allergic reaction(s) Hematologic/Lymphatic: Patient denies bleeding tendencies Endocrine: Patient denies heat/cold intolerance  GU: As per HPI.  OBJECTIVE Vitals:   04/25/23 1157  BP: 138/72  Pulse: 92  Temp: 98 F (36.7 C)   There is no height or weight on file to calculate BMI.  Physical Examination Constitutional: No obvious distress; patient is non-toxic appearing  Cardiovascular: No visible lower extremity edema.  Respiratory: The patient does not have audible wheezing/stridor; respirations do not appear labored  Gastrointestinal: Abdomen non-distended Musculoskeletal: Normal ROM of UEs  Skin: No obvious rashes/open sores  Neurologic: CN 2-12 grossly intact Psychiatric: Answered questions appropriately with normal affect  Hematologic/Lymphatic/Immunologic: No obvious bruises or sites of spontaneous bleeding  UA: 1+ glucose; otherwise unremarkable PVR: 0 ml  ASSESSMENT Vaginal atrophy - Plan: Urinalysis, Routine w reflex microscopic, BLADDER SCAN AMB NON-IMAGING, estradiol (ESTRACE) 0.1 MG/GM vaginal  cream  History of recurrent UTI (urinary tract infection) - Plan: estradiol (ESTRACE) 0.1 MG/GM vaginal cream  Chronic bilateral low back pain, unspecified whether sciatica present  We reviewed the diagnostic criteria for recurrent UTI and that we do not currently have adequate urine culture data to support or refute the formal diagnosis of recurrent UTI.  - We discussed the difference between urinalysis (urine dipstick test) vs. urine culture and why urine cultures are needed to determine appropriate diagnosis and treatment  of urologic symptoms. - We reviewed UTI symptoms which may include: dysuria, acutely increased urinary urgency and/or frequency, fever, acute mental status change / confusion, general malaise, fatigue, weakness. - We discussed that urine color, clarity, and odor are not considered to be clinical indicators of UTI and do not warrant urine testing unless patient is acutely symptomatic for UTI.  For UTI prevention: - Advised patient to maintain adequate fluid intake daily to flush out the urinary tract.  - Handout provided for additional management strategies to consider, including OTC supplements. - We reviewed the rationale for topical vaginal estrogen cream use consistently and long term to address vaginal atrophy. The etiology and consequences of urogenital epithelial atrophy was explained to patient. The thinning of the epithelium of the urethra can contribute to urinary urgency and frequency syndromes. In addition, the normal bacterial flora that colonizes the perineum may contribute to UTI risk because the thin urethral epithelium allows the bacteria to become adherent and the change in vaginal pH can disrupt the vaginal / urethral microbiome and allow for bacterial overgrowth. - The normal bacterial flora that colonizes the perineum may contribute to UTI risk because the thin urethral epithelium allows the bacteria to become adherent and the change in vaginal pH can disrupt  the vaginal / urethral microbiome and allow for bacterial overgrowth.  - Topical vaginal estrogen replacement will take about 3 months to restore the vaginal pH. - Topical vaginal replacement may sting/burn initially due to severe dryness, which will improve with ongoing treatment as the tissue gets healthier.  - OK to have sex with any of the topical vaginal estrogen replacement options.  - There have been studies that evaluate use of low-dose intravaginal estrogen cream that shows minimal systemic absorption that is negligible after 3 weeks. There have been no studies indicating that use of topical vaginal estrogen increases a patient's risk of contributing to breast cancer development or recurrence.  If recurrent UTls persist, may consider UTI prophylaxis with a daily low dose antibiotic in the future but this does not appear to be warranted at this time.  Question if her intermittent "kidney pain" may actually be musculoskeletal / related to her chronic back pain.   For reported cystocele, advised her to follow up with her GYN provider.   We agreed to plan for follow up in 6 months or sooner if needed. Patient verbalized understanding of and agreement with current plan. All questions were answered.  PLAN Advised the following: 1. Start consistent use of topical vaginal estrogen cream as prescribed.  2. Maintain adequate fluid intake daily to flush out the urinary tract. 3. Return in about 6 months (around 10/26/2023) for UA, PVR, & f/u with Evette Georges NP.  Orders Placed This Encounter  Procedures   Urinalysis, Routine w reflex microscopic   BLADDER SCAN AMB NON-IMAGING   Total time spent caring for the patient today was over 30 minutes. This includes time spent on the date of the visit reviewing the patient's chart before the visit, time spent during the visit, and time spent after the visit on documentation. Over 50% of that time was spent in face-to-face time with this patient for  direct counseling. E&M based on time and complexity of medical decision making.  It has been explained that the patient is to follow regularly with their PCP in addition to all other providers involved in their care and to follow instructions provided by these respective offices. Patient advised to contact urology clinic if any urologic-pertaining questions,  concerns, new symptoms or problems arise in the interim period.  Patient Instructions  UTI prevention / management:  Difference between Urinalysis (urine dipstick test) and Urine culture:  Urinalysis (urine dipstick test): A quick office test used as an indicator to determine whether or not further testing is necessary (such as a urine culture, urine microscopy, etc.) The urinalysis cannot differentiate a true bacterial UTI or give a definitive diagnosis for the findings.  Urine culture: May be performed based on the findings of a urinalysis to evaluate for UTI. Grows out on a petri dish for 48-72 hours. Provides important information about: whether or not bacterial growth is present and if so: what the predominant bacteria is which antibiotics will work best against that bacteria That information is important so that we can diagnose and treat patients appropriately as there are other conditions which may mimic UTls which must not be missed (such as cancer, interstitial cystitis, stones, etc.). Assists [place] with antibiotic stewardship to minimize patient's risk for developing antibiotic resistance (getting to a point where no antibiotics work anymore).  Options when UTI symptoms occur: 1. Call [facility] [place] and request to speak with triage nurse (phone # [phone], select option 3). In accordance with clinic guidelines the nurse will determine next steps based on patient-reported symptoms, which may include: same-day lab visit to provide urine specimen, recommendation to schedule Urology office visit appointment for  further evaluation, recommendation to proceed to ER, etc. 2. Call your Primary Care Provider (PCP) office to request urgent / same-day visit. Be sure to request for urine culture to be ordered and have results faxed to Urology (fax # [phone]).  3. Go to urgent care. Be sure to request for urine culture to be ordered and have results faxed to Urology (fax # [phone]).   For bladder pain/ burning with urination: - Can take over-the-counter Pyridium (phenazopyridine; commonly known under the "AZO" brand) for a few days as needed. Limit use to no more than 3 days consecutively due to risk for methemoglobinemia, liver function issues, and bone health damage with long term use of Pyridium. - Alternative: Prescription urinary analgesics (such as Uribel, Urogesic blue, Urelle, Uro-MP). Often expensive / poorly covered by insurance unfortunately.  Options / recommendations for UTI prevention: - Topical vaginal estrogen for vaginal atrophy (aka Genitourinary Syndrome of Menopause (GSM)). - Adequate daily fluid intake to flush out the urinary tract. - Go to the bathroom to urinate every 4-6 hours while awake to minimize urinary stasis / bacterial overgrowth in the bladder. - Proanthocyanidin (PAC) supplement 36 mg daily; must be soluble (insoluble form of PAC will be ineffective). Recommended brand: Ellura. This is an over-the-counter supplement (often must be found/ purchased online) supplement derived from cranberries with concentrated active component: Proanthocyanidin (PAC) 36 mg daily. Decreases bacterial adherence to bladder lining.  - D-mannose powder (2 grams daily). This is an over-the-counter supplement which decreases bacterial adherence to bladder lining (it is a sugar that inhibits bacterial adherence to urothelial cells by binding to the pili of enteric bacteria). Take as per manufacturer recommendation. Can be used as an alternative or in addition to the concentrated cranberry supplement.   - Vitamin C supplement to acidify urine to minimize bacterial growth.  - Probiotic to maintain healthy vaginal microbiome to suppress bacteria at urethral opening. Brand recommendations: Darrold Junker (includes probiotic & D-mannose ), Feminine Balance (highest concentration of lactobacillus) or Hyperbiotic Pro 15.  Note for patients with diabetes:  - Be aware that D-mannose contains sugar.  Vaginal atrophy I Genitourinary syndrome of menopause (GSM):  What it is: Changes in the vaginal environment (including the vulva and urethra) including: Thinning of the epithelium (skin/ mucosa surface) Can contribute to urinary urgency and frequency Can contribute to dryness, itching, irritation of the vulvar and vaginal tissue Can contribute to pain with intercourse Can contribute to physical changes of the labia, vulva, and vagina such as: Narrowing of the vaginal opening Decreased vaginal length Loss of labial architecture Labial adhesions Pale color of vulvovaginal tissue  Loss of pubic hair Allows bacteria to become adherent  Results in increased risk for urinary tract infection (UTI) due to bacterial overgrowth and migration up the urethra into the bladder Change in vaginal pH (acid/ base balance) Allows for alteration / disruption of the normal bacterial flora / microbiome Results in increased risk for urinary tract infection (UTI) due to bacterial overgrowth  Treatment options: Over-the-counter lubricants (see list below). Prescription vaginal estrogen replacement. Options: Topical vaginal estrogen cream Estrace, Premarin, or compounded estradiol cream/ gel We advise: Discard plastic applicator as that tends to use more medication than you need, which is not harmful but wastes / uses up the medication. Also the plastic applicator may cause discomfort. Insert blueberry size amount of medication via the tip of your finger inside vagina nightly for 1 week then 2-3 times per week (long  term). Estring vaginal ring Exchanged every 3 months (either at home or in office by provider) Vagifem vaginal tablet Inserted nightly for 2 weeks then twice a week (long term) lntrarosa vaginal suppository Vaginal DHEA: converts to estrogen in vaginal tissue without systemic effect Inserted nightly (long term) Vaginal laser therapy (Mona Lisa touch) Performed in 3 treatments each 6 weeks apart (available in our [place] office). Can feel like a sunburn for 3-4 days after each treatment until new skin heals in. Usually not covered by insurance. Estimated cost is $1500 for all 3 sessions.  FYI regarding prescription vaginal estrogen treatment options: All topical vaginal estrogen replacement options are equivalent in terms of efficacy. Topical vaginal estrogen replacement will take about 3 months to be effective. OK to have sex with any of the topical vaginal estrogen replacement options. Topical vaginal estrogen replacement may sting/burn initially due to severe dryness, which will improve with ongoing treatment. There have been studies that evaluate use of low-dose intravaginal estrogen that show minimal systemic absorption which is negligible after 3 weeks. There have been no studies indicating increased risk of contributing to cancer development or recurrence.  Topical vaginal estrogen cream safe to use with breast cancer history [email or web address]  Topical vaginal estrogen cream safe to use with blood clot history [email or web address]   Lubricants and Moisturizers for Treating Genitourinary Syndrome of Menopause and Vulvovaginal Atrophy Treatment Comments I Available Products   Lubricants   Water-based Ingredients: Deionized water, glycerin,  propylene glycol; latex safe; rare irritation; dry out with extended sexual activity Astroglide, Good Clean Love, K-Y Jelly, Natural, Organic, Pink, Sliquid, Sylk, Yes    Oil Based Ingredients: avocado, olive, peanut, corn; latex safe; can be used with silicone products; staining; safe (unless peanut allergy); non-irritating Coconut oil, vegetable oil, vitamin E oil  Silicone-Based Ingredients: Silicone polymers; staining; typically nonirritating, long lasting; waterproof; should not be used with silicone dilators, sexual toys, or gynecologic products Astroglide X, Oceanus Ultra Pure, Pink Silicone, Pjur Eros, Replens Silky Smooth, Silicone Premium JO, SKYN, Uberlube, [employer] Based Minimize harm to sperm motility; designed Astroglide TTC, Conceive Plus, Pre for couples trying to conceive  Seed, Yes Baby  Fertility Friendly Minimize harm to sperm motility; designed Astroglide, TTC, Conceive Plus, Pre for couples trying to conceive Seed, Yes Baby  Vaginal Moisturizers   Vaginal Moisturizers For maintenance use 1 to 3 times weekly; can benefit women with dryness, chafing with AOL, and recurrent vaginal infections irrespective of sexual activity timing Balance Active Menopause Vaginal Moisturizing Lubricant, Canesintima Intimate Moisturizer, Replens, Rephresh, Sylk Natural Intimate Moisturizer, Yes Vaginal Moisturizer  Hybrids Properties of both water and silicone-based products (combination of a vaginal lubricant and moisturizer); Non-irritating; good option for women with allergies and sensitivities Lubrigyn, Luvena  Suppositories Hyaluronic acid to retain moisture Revaree  Vulvar Soothing Creams/Oils    Medicated CreamsP ain and burn relief; Ingredients: 4% Lidocaine, Aloe Vera gel Releveum ([place])  Non-Medicated Creams For anti-itch and moisture/maintenance; Ingredients: Coconut oil, Avocado oil, Shea Butter, Olive oil, Vitamin E Vajuvenate, Vmagic  Oils !For  moisture/maintenance !Coconut oil, Vitamin E oil, Emu oil     Electronically signed by:  Donnita Falls, FNP   04/25/23    1:45 PM

## 2023-04-21 ENCOUNTER — Other Ambulatory Visit (HOSPITAL_COMMUNITY)

## 2023-04-25 ENCOUNTER — Encounter: Payer: Self-pay | Admitting: Urology

## 2023-04-25 ENCOUNTER — Ambulatory Visit: Admitting: Urology

## 2023-04-25 VITALS — BP 138/72 | HR 92 | Temp 98.0°F

## 2023-04-25 DIAGNOSIS — Z87440 Personal history of urinary (tract) infections: Secondary | ICD-10-CM

## 2023-04-25 DIAGNOSIS — R3129 Other microscopic hematuria: Secondary | ICD-10-CM | POA: Diagnosis not present

## 2023-04-25 DIAGNOSIS — M545 Low back pain, unspecified: Secondary | ICD-10-CM

## 2023-04-25 DIAGNOSIS — N952 Postmenopausal atrophic vaginitis: Secondary | ICD-10-CM

## 2023-04-25 DIAGNOSIS — G8929 Other chronic pain: Secondary | ICD-10-CM | POA: Insufficient documentation

## 2023-04-25 DIAGNOSIS — M5416 Radiculopathy, lumbar region: Secondary | ICD-10-CM | POA: Insufficient documentation

## 2023-04-25 LAB — URINALYSIS, ROUTINE W REFLEX MICROSCOPIC
Bilirubin, UA: NEGATIVE
Ketones, UA: NEGATIVE
Leukocytes,UA: NEGATIVE
Nitrite, UA: NEGATIVE
Protein,UA: NEGATIVE
RBC, UA: NEGATIVE
Specific Gravity, UA: 1.01 (ref 1.005–1.030)
Urobilinogen, Ur: 0.2 mg/dL (ref 0.2–1.0)
pH, UA: 6 (ref 5.0–7.5)

## 2023-04-25 LAB — BLADDER SCAN AMB NON-IMAGING: Scan Result: 0

## 2023-04-25 MED ORDER — ESTRADIOL 0.1 MG/GM VA CREA: TOPICAL_CREAM | VAGINAL | 3 refills | Status: AC

## 2023-04-25 NOTE — Patient Instructions (Addendum)
 UTI prevention / management:  Difference between Urinalysis (urine dipstick test) and Urine culture:  Urinalysis (urine dipstick test): A quick office test used as an indicator to determine whether or not further testing is necessary (such as a urine culture, urine microscopy, etc.) The urinalysis cannot differentiate a true bacterial UTI or give a definitive diagnosis for the findings.  Urine culture: May be performed based on the findings of a urinalysis to evaluate for UTI. Grows out on a petri dish for 48-72 hours. Provides important information about: whether or not bacterial growth is present and if so: what the predominant bacteria is which antibiotics will work best against that bacteria That information is important so that we can diagnose and treat patients appropriately as there are other conditions which may mimic UTls which must not be missed (such as cancer, interstitial cystitis, stones, etc.). Assists [place] with antibiotic stewardship to minimize patient's risk for developing antibiotic resistance (getting to a point where no antibiotics work anymore).  Options when UTI symptoms occur: 1. Call [facility] [place] and request to speak with triage nurse (phone # [phone], select option 3). In accordance with clinic guidelines the nurse will determine next steps based on patient-reported symptoms, which may include: same-day lab visit to provide urine specimen, recommendation to schedule Urology office visit appointment for further evaluation, recommendation to proceed to ER, etc. 2. Call your Primary Care Provider (PCP) office to request urgent / same-day visit. Be sure to request for urine culture to be ordered and have results faxed to Urology (fax # [phone]).  3. Go to urgent care. Be sure to request for urine culture to be ordered and have results faxed to Urology (fax # [phone]).   For bladder pain/ burning with urination: - Can take over-the-counter  Pyridium (phenazopyridine; commonly known under the "AZO" brand) for a few days as needed. Limit use to no more than 3 days consecutively due to risk for methemoglobinemia, liver function issues, and bone health damage with long term use of Pyridium. - Alternative: Prescription urinary analgesics (such as Uribel, Urogesic blue, Urelle, Uro-MP). Often expensive / poorly covered by insurance unfortunately.  Options / recommendations for UTI prevention: - Topical vaginal estrogen for vaginal atrophy (aka Genitourinary Syndrome of Menopause (GSM)). - Adequate daily fluid intake to flush out the urinary tract. - Go to the bathroom to urinate every 4-6 hours while awake to minimize urinary stasis / bacterial overgrowth in the bladder. - Proanthocyanidin (PAC) supplement 36 mg daily; must be soluble (insoluble form of PAC will be ineffective). Recommended brand: Ellura. This is an over-the-counter supplement (often must be found/ purchased online) supplement derived from cranberries with concentrated active component: Proanthocyanidin (PAC) 36 mg daily. Decreases bacterial adherence to bladder lining.  - D-mannose powder (2 grams daily). This is an over-the-counter supplement which decreases bacterial adherence to bladder lining (it is a sugar that inhibits bacterial adherence to urothelial cells by binding to the pili of enteric bacteria). Take as per manufacturer recommendation. Can be used as an alternative or in addition to the concentrated cranberry supplement.  - Vitamin C supplement to acidify urine to minimize bacterial growth.  - Probiotic to maintain healthy vaginal microbiome to suppress bacteria at urethral opening. Brand recommendations: Darrold Junker (includes probiotic & D-mannose ), Feminine Balance (highest concentration of lactobacillus) or Hyperbiotic Pro 15.  Note for patients with diabetes:  - Be aware that D-mannose contains sugar.    Vaginal atrophy I Genitourinary syndrome of menopause  (GSM):  What it  is: Changes in the vaginal environment (including the vulva and urethra) including: Thinning of the epithelium (skin/ mucosa surface) Can contribute to urinary urgency and frequency Can contribute to dryness, itching, irritation of the vulvar and vaginal tissue Can contribute to pain with intercourse Can contribute to physical changes of the labia, vulva, and vagina such as: Narrowing of the vaginal opening Decreased vaginal length Loss of labial architecture Labial adhesions Pale color of vulvovaginal tissue Loss of pubic hair Allows bacteria to become adherent  Results in increased risk for urinary tract infection (UTI) due to bacterial overgrowth and migration up the urethra into the bladder Change in vaginal pH (acid/ base balance) Allows for alteration / disruption of the normal bacterial flora / microbiome Results in increased risk for urinary tract infection (UTI) due to bacterial overgrowth  Treatment options: Over-the-counter lubricants (see list below). Prescription vaginal estrogen replacement. Options: Topical vaginal estrogen cream Estrace, Premarin, or compounded estradiol cream/ gel We advise: Discard plastic applicator as that tends to use more medication than you need, which is not harmful but wastes / uses up the medication. Also the plastic applicator may cause discomfort. Insert blueberry size amount of medication via the tip of your finger inside vagina nightly for 1 week then 2-3 times per week (long term). Estring vaginal ring Exchanged every 3 months (either at home or in office by provider) Vagifem vaginal tablet Inserted nightly for 2 weeks then twice a week (long term) lntrarosa vaginal suppository Vaginal DHEA: converts to estrogen in vaginal tissue without systemic effect Inserted nightly (long term) Vaginal laser therapy (Mona Lisa touch) Performed in 3 treatments each 6 weeks apart (available in our [place] office). Can feel like a  sunburn for 3-4 days after each treatment until new skin heals in. Usually not covered by insurance. Estimated cost is $1500 for all 3 sessions.  FYI regarding prescription vaginal estrogen treatment options: All topical vaginal estrogen replacement options are equivalent in terms of efficacy. Topical vaginal estrogen replacement will take about 3 months to be effective. OK to have sex with any of the topical vaginal estrogen replacement options. Topical vaginal estrogen replacement may sting/burn initially due to severe dryness, which will improve with ongoing treatment. There have been studies that evaluate use of low-dose intravaginal estrogen that show minimal systemic absorption which is negligible after 3 weeks. There have been no studies indicating increased risk of contributing to cancer development or recurrence.  Topical vaginal estrogen cream safe to use with breast cancer history [email or web address]  Topical vaginal estrogen cream safe to use with blood clot history [email or web address]   Lubricants and Moisturizers for Treating Genitourinary Syndrome of Menopause and Vulvovaginal Atrophy Treatment Comments I Available Products   Lubricants   Water-based Ingredients: Deionized water, glycerin, propylene glycol; latex safe; rare irritation; dry out with extended sexual activity Astroglide, Good Clean Love, K-Y Jelly, Natural, Organic, Pink, Sliquid, Sylk, Yes    Oil Based Ingredients: avocado, olive, peanut, corn; latex safe; can be used with silicone products; staining; safe (unless peanut allergy); non-irritating Coconut oil, vegetable oil, vitamin E oil  Silicone-Based Ingredients: Silicone polymers; staining; typically nonirritating,  long lasting; waterproof; should not be used with silicone dilators, sexual toys, or gynecologic products Astroglide X, Oceanus Ultra Pure, Pink Silicone, Pjur Eros, Replens Silky Smooth, Silicone Premium JO, SKYN, Uberlube, [employer] Based Minimize harm to sperm motility; designed Astroglide TTC, Conceive Plus, Pre for couples trying to conceive Seed, Yes Baby  Fertility Friendly Minimize harm to sperm motility; designed  Astroglide, TTC, Conceive Plus, Pre for couples trying to conceive Seed, Yes Baby  Vaginal Moisturizers   Vaginal Moisturizers For maintenance use 1 to 3 times weekly; can benefit women with dryness, chafing with AOL, and recurrent vaginal infections irrespective of sexual activity timing Balance Active Menopause Vaginal Moisturizing Lubricant, Canesintima Intimate Moisturizer, Replens, Rephresh, Sylk Natural Intimate Moisturizer, Yes Vaginal Moisturizer  Hybrids Properties of both water and silicone-based products (combination of a vaginal lubricant and moisturizer); Non-irritating; good option for women with allergies and sensitivities Lubrigyn, Luvena  Suppositories Hyaluronic acid to retain moisture Revaree  Vulvar Soothing Creams/Oils    Medicated CreamsP ain and burn relief; Ingredients: 4% Lidocaine, Aloe Vera gel Releveum ([employer])  Non-Medicated Creams For anti-itch and moisture/maintenance; Ingredients: Coconut oil, Avocado oil, Shea Butter, Olive oil, Vitamin E Vajuvenate, Vmagic  Oils !For moisture/maintenance !Coconut oil, Vitamin E oil, Emu oil

## 2023-04-27 ENCOUNTER — Other Ambulatory Visit: Payer: Self-pay | Admitting: Gastroenterology

## 2023-06-10 DIAGNOSIS — E663 Overweight: Secondary | ICD-10-CM | POA: Diagnosis not present

## 2023-06-10 DIAGNOSIS — I10 Essential (primary) hypertension: Secondary | ICD-10-CM | POA: Diagnosis not present

## 2023-06-10 DIAGNOSIS — G894 Chronic pain syndrome: Secondary | ICD-10-CM | POA: Diagnosis not present

## 2023-06-10 DIAGNOSIS — Z6827 Body mass index (BMI) 27.0-27.9, adult: Secondary | ICD-10-CM | POA: Diagnosis not present

## 2023-06-10 DIAGNOSIS — M5134 Other intervertebral disc degeneration, thoracic region: Secondary | ICD-10-CM | POA: Diagnosis not present

## 2023-08-10 DIAGNOSIS — M5136 Other intervertebral disc degeneration, lumbar region with discogenic back pain only: Secondary | ICD-10-CM | POA: Diagnosis not present

## 2023-08-10 DIAGNOSIS — Z6828 Body mass index (BMI) 28.0-28.9, adult: Secondary | ICD-10-CM | POA: Diagnosis not present

## 2023-08-10 DIAGNOSIS — M5134 Other intervertebral disc degeneration, thoracic region: Secondary | ICD-10-CM | POA: Diagnosis not present

## 2023-08-10 DIAGNOSIS — M5030 Other cervical disc degeneration, unspecified cervical region: Secondary | ICD-10-CM | POA: Diagnosis not present

## 2023-08-10 DIAGNOSIS — G894 Chronic pain syndrome: Secondary | ICD-10-CM | POA: Diagnosis not present

## 2023-08-10 DIAGNOSIS — E663 Overweight: Secondary | ICD-10-CM | POA: Diagnosis not present

## 2023-08-10 DIAGNOSIS — I10 Essential (primary) hypertension: Secondary | ICD-10-CM | POA: Diagnosis not present

## 2023-10-07 DIAGNOSIS — I10 Essential (primary) hypertension: Secondary | ICD-10-CM | POA: Diagnosis not present

## 2023-10-07 DIAGNOSIS — E663 Overweight: Secondary | ICD-10-CM | POA: Diagnosis not present

## 2023-10-07 DIAGNOSIS — Z6829 Body mass index (BMI) 29.0-29.9, adult: Secondary | ICD-10-CM | POA: Diagnosis not present

## 2023-10-07 DIAGNOSIS — G894 Chronic pain syndrome: Secondary | ICD-10-CM | POA: Diagnosis not present

## 2023-10-07 DIAGNOSIS — M5134 Other intervertebral disc degeneration, thoracic region: Secondary | ICD-10-CM | POA: Diagnosis not present

## 2023-10-07 DIAGNOSIS — G4733 Obstructive sleep apnea (adult) (pediatric): Secondary | ICD-10-CM | POA: Diagnosis not present

## 2023-10-07 DIAGNOSIS — M5136 Other intervertebral disc degeneration, lumbar region with discogenic back pain only: Secondary | ICD-10-CM | POA: Diagnosis not present

## 2023-10-26 ENCOUNTER — Ambulatory Visit: Admitting: Urology

## 2023-10-26 ENCOUNTER — Other Ambulatory Visit: Payer: Self-pay | Admitting: Internal Medicine

## 2023-11-11 DIAGNOSIS — M792 Neuralgia and neuritis, unspecified: Secondary | ICD-10-CM | POA: Diagnosis not present

## 2023-11-11 DIAGNOSIS — N390 Urinary tract infection, site not specified: Secondary | ICD-10-CM | POA: Diagnosis not present

## 2023-11-11 DIAGNOSIS — K219 Gastro-esophageal reflux disease without esophagitis: Secondary | ICD-10-CM | POA: Diagnosis not present

## 2023-11-11 DIAGNOSIS — Z9049 Acquired absence of other specified parts of digestive tract: Secondary | ICD-10-CM | POA: Diagnosis not present

## 2023-11-11 DIAGNOSIS — Z7689 Persons encountering health services in other specified circumstances: Secondary | ICD-10-CM | POA: Diagnosis not present

## 2023-11-11 DIAGNOSIS — K760 Fatty (change of) liver, not elsewhere classified: Secondary | ICD-10-CM | POA: Diagnosis not present

## 2023-11-11 DIAGNOSIS — E2740 Unspecified adrenocortical insufficiency: Secondary | ICD-10-CM | POA: Diagnosis not present

## 2023-11-11 DIAGNOSIS — I10 Essential (primary) hypertension: Secondary | ICD-10-CM | POA: Diagnosis not present

## 2023-11-11 DIAGNOSIS — Z9109 Other allergy status, other than to drugs and biological substances: Secondary | ICD-10-CM | POA: Diagnosis not present

## 2023-11-11 DIAGNOSIS — F5104 Psychophysiologic insomnia: Secondary | ICD-10-CM | POA: Diagnosis not present

## 2023-11-11 DIAGNOSIS — K449 Diaphragmatic hernia without obstruction or gangrene: Secondary | ICD-10-CM | POA: Diagnosis not present

## 2023-11-11 DIAGNOSIS — F411 Generalized anxiety disorder: Secondary | ICD-10-CM | POA: Diagnosis not present

## 2023-12-19 ENCOUNTER — Other Ambulatory Visit (HOSPITAL_COMMUNITY): Payer: Self-pay | Admitting: Nurse Practitioner

## 2023-12-19 DIAGNOSIS — F5104 Psychophysiologic insomnia: Secondary | ICD-10-CM | POA: Diagnosis not present

## 2023-12-19 DIAGNOSIS — K449 Diaphragmatic hernia without obstruction or gangrene: Secondary | ICD-10-CM | POA: Diagnosis not present

## 2023-12-19 DIAGNOSIS — K219 Gastro-esophageal reflux disease without esophagitis: Secondary | ICD-10-CM | POA: Diagnosis not present

## 2023-12-19 DIAGNOSIS — N390 Urinary tract infection, site not specified: Secondary | ICD-10-CM | POA: Diagnosis not present

## 2023-12-19 DIAGNOSIS — E2740 Unspecified adrenocortical insufficiency: Secondary | ICD-10-CM | POA: Diagnosis not present

## 2023-12-19 DIAGNOSIS — I10 Essential (primary) hypertension: Secondary | ICD-10-CM | POA: Diagnosis not present

## 2023-12-19 DIAGNOSIS — Z23 Encounter for immunization: Secondary | ICD-10-CM | POA: Diagnosis not present

## 2023-12-19 DIAGNOSIS — Z9049 Acquired absence of other specified parts of digestive tract: Secondary | ICD-10-CM | POA: Diagnosis not present

## 2023-12-19 DIAGNOSIS — F411 Generalized anxiety disorder: Secondary | ICD-10-CM | POA: Diagnosis not present

## 2023-12-19 DIAGNOSIS — Z122 Encounter for screening for malignant neoplasm of respiratory organs: Secondary | ICD-10-CM

## 2023-12-19 DIAGNOSIS — M792 Neuralgia and neuritis, unspecified: Secondary | ICD-10-CM | POA: Diagnosis not present

## 2023-12-19 DIAGNOSIS — K760 Fatty (change of) liver, not elsewhere classified: Secondary | ICD-10-CM | POA: Diagnosis not present

## 2023-12-19 DIAGNOSIS — R300 Dysuria: Secondary | ICD-10-CM | POA: Diagnosis not present

## 2023-12-19 DIAGNOSIS — Z87891 Personal history of nicotine dependence: Secondary | ICD-10-CM

## 2023-12-21 ENCOUNTER — Encounter: Payer: Self-pay | Admitting: Internal Medicine

## 2024-01-03 ENCOUNTER — Ambulatory Visit (HOSPITAL_COMMUNITY)
Admission: RE | Admit: 2024-01-03 | Discharge: 2024-01-03 | Disposition: A | Discharge status: Home / Self Care | Source: Ambulatory Visit | Attending: Nurse Practitioner | Admitting: Nurse Practitioner

## 2024-01-03 DIAGNOSIS — F17210 Nicotine dependence, cigarettes, uncomplicated: Secondary | ICD-10-CM | POA: Diagnosis not present

## 2024-01-03 DIAGNOSIS — Z122 Encounter for screening for malignant neoplasm of respiratory organs: Secondary | ICD-10-CM | POA: Insufficient documentation

## 2024-01-03 DIAGNOSIS — Z87891 Personal history of nicotine dependence: Secondary | ICD-10-CM | POA: Insufficient documentation
